# Patient Record
Sex: Male | Born: 1955 | Race: Black or African American | Hispanic: No | Smoking: Current every day smoker
Health system: Southern US, Community
[De-identification: ages and names within clinical notes are randomized; demographics above are authoritative.]

## PROBLEM LIST (undated history)

## (undated) DIAGNOSIS — E785 Hyperlipidemia, unspecified: Secondary | ICD-10-CM

## (undated) DIAGNOSIS — R7611 Nonspecific reaction to tuberculin skin test without active tuberculosis: Secondary | ICD-10-CM

## (undated) DIAGNOSIS — K7460 Unspecified cirrhosis of liver: Secondary | ICD-10-CM

## (undated) DIAGNOSIS — F1910 Other psychoactive substance abuse, uncomplicated: Secondary | ICD-10-CM

## (undated) DIAGNOSIS — I10 Essential (primary) hypertension: Secondary | ICD-10-CM

## (undated) DIAGNOSIS — K219 Gastro-esophageal reflux disease without esophagitis: Secondary | ICD-10-CM

## (undated) DIAGNOSIS — H269 Unspecified cataract: Secondary | ICD-10-CM

## (undated) DIAGNOSIS — N200 Calculus of kidney: Secondary | ICD-10-CM

## (undated) DIAGNOSIS — K861 Other chronic pancreatitis: Secondary | ICD-10-CM

## (undated) DIAGNOSIS — I8500 Esophageal varices without bleeding: Secondary | ICD-10-CM

## (undated) DIAGNOSIS — Z720 Tobacco use: Secondary | ICD-10-CM

## (undated) DIAGNOSIS — K859 Acute pancreatitis without necrosis or infection, unspecified: Secondary | ICD-10-CM

## (undated) DIAGNOSIS — E663 Overweight: Secondary | ICD-10-CM

## (undated) DIAGNOSIS — B182 Chronic viral hepatitis C: Secondary | ICD-10-CM

## (undated) HISTORY — DX: Calculus of kidney: N20.0

## (undated) HISTORY — DX: Essential (primary) hypertension: I10

## (undated) HISTORY — DX: Other chronic pancreatitis: K86.1

## (undated) HISTORY — DX: Hyperlipidemia, unspecified: E78.5

## (undated) HISTORY — DX: Overweight: E66.3

## (undated) HISTORY — DX: Unspecified cirrhosis of liver: K74.60

## (undated) HISTORY — DX: Chronic viral hepatitis C: B18.2

## (undated) HISTORY — DX: Tobacco use: Z72.0

## (undated) HISTORY — DX: Unspecified cataract: H26.9

## (undated) HISTORY — DX: Other psychoactive substance abuse, uncomplicated: F19.10

## (undated) HISTORY — DX: Nonspecific reaction to tuberculin skin test without active tuberculosis: R76.11

## (undated) HISTORY — DX: Acute pancreatitis without necrosis or infection, unspecified: K85.90

---

## 2004-04-26 DIAGNOSIS — B182 Chronic viral hepatitis C: Secondary | ICD-10-CM

## 2004-04-26 HISTORY — PX: PERCUTANEOUS LIVER BIOPSY: SUR136

## 2004-04-26 HISTORY — DX: Chronic viral hepatitis C: B18.2

## 2004-09-03 ENCOUNTER — Ambulatory Visit: Payer: Self-pay | Admitting: Internal Medicine

## 2004-09-10 ENCOUNTER — Ambulatory Visit (HOSPITAL_COMMUNITY): Admission: RE | Admit: 2004-09-10 | Discharge: 2004-09-10 | Payer: Self-pay | Admitting: Internal Medicine

## 2004-10-21 ENCOUNTER — Ambulatory Visit: Payer: Self-pay | Admitting: Internal Medicine

## 2004-10-21 ENCOUNTER — Ambulatory Visit (HOSPITAL_COMMUNITY): Admission: RE | Admit: 2004-10-21 | Discharge: 2004-10-21 | Payer: Self-pay | Admitting: Internal Medicine

## 2004-10-22 ENCOUNTER — Ambulatory Visit (HOSPITAL_COMMUNITY): Admission: RE | Admit: 2004-10-22 | Discharge: 2004-10-22 | Payer: Self-pay | Admitting: Internal Medicine

## 2004-11-17 ENCOUNTER — Ambulatory Visit: Payer: Self-pay | Admitting: Internal Medicine

## 2005-01-21 ENCOUNTER — Ambulatory Visit: Payer: Self-pay | Admitting: Internal Medicine

## 2005-02-10 ENCOUNTER — Ambulatory Visit: Payer: Self-pay | Admitting: Internal Medicine

## 2005-02-10 ENCOUNTER — Encounter (INDEPENDENT_AMBULATORY_CARE_PROVIDER_SITE_OTHER): Payer: Self-pay | Admitting: Internal Medicine

## 2005-02-10 ENCOUNTER — Ambulatory Visit (HOSPITAL_COMMUNITY): Admission: RE | Admit: 2005-02-10 | Discharge: 2005-02-10 | Payer: Self-pay | Admitting: Internal Medicine

## 2005-03-01 ENCOUNTER — Ambulatory Visit: Payer: Self-pay | Admitting: Internal Medicine

## 2005-03-12 ENCOUNTER — Ambulatory Visit: Payer: Self-pay | Admitting: Internal Medicine

## 2005-03-31 ENCOUNTER — Ambulatory Visit: Payer: Self-pay | Admitting: Internal Medicine

## 2005-06-01 ENCOUNTER — Ambulatory Visit: Payer: Self-pay | Admitting: Internal Medicine

## 2005-08-23 ENCOUNTER — Ambulatory Visit: Payer: Self-pay | Admitting: Internal Medicine

## 2005-11-05 ENCOUNTER — Ambulatory Visit: Payer: Self-pay | Admitting: Gastroenterology

## 2006-02-08 ENCOUNTER — Ambulatory Visit: Payer: Self-pay | Admitting: Gastroenterology

## 2006-04-13 ENCOUNTER — Ambulatory Visit: Payer: Self-pay | Admitting: Internal Medicine

## 2006-04-26 DIAGNOSIS — R7611 Nonspecific reaction to tuberculin skin test without active tuberculosis: Secondary | ICD-10-CM

## 2006-04-26 HISTORY — DX: Nonspecific reaction to tuberculin skin test without active tuberculosis: R76.11

## 2008-01-09 ENCOUNTER — Ambulatory Visit (HOSPITAL_COMMUNITY): Admission: RE | Admit: 2008-01-09 | Discharge: 2008-01-09 | Payer: Self-pay | Admitting: Internal Medicine

## 2008-02-17 ENCOUNTER — Emergency Department (HOSPITAL_COMMUNITY): Admission: EM | Admit: 2008-02-17 | Discharge: 2008-02-17 | Payer: Self-pay | Admitting: Emergency Medicine

## 2008-03-06 ENCOUNTER — Ambulatory Visit (HOSPITAL_COMMUNITY): Admission: RE | Admit: 2008-03-06 | Discharge: 2008-03-06 | Payer: Self-pay | Admitting: Family Medicine

## 2008-04-03 ENCOUNTER — Ambulatory Visit (HOSPITAL_COMMUNITY): Admission: RE | Admit: 2008-04-03 | Discharge: 2008-04-03 | Payer: Self-pay | Admitting: Pulmonary Disease

## 2010-06-19 ENCOUNTER — Emergency Department (HOSPITAL_COMMUNITY)
Admission: EM | Admit: 2010-06-19 | Discharge: 2010-06-19 | Disposition: A | Discharge status: Home / Self Care | Payer: Medicare Other | Attending: Emergency Medicine | Admitting: Emergency Medicine

## 2010-06-19 ENCOUNTER — Emergency Department (HOSPITAL_COMMUNITY): Payer: Medicare Other

## 2010-06-19 DIAGNOSIS — I10 Essential (primary) hypertension: Secondary | ICD-10-CM | POA: Insufficient documentation

## 2010-06-19 DIAGNOSIS — R109 Unspecified abdominal pain: Secondary | ICD-10-CM | POA: Insufficient documentation

## 2010-06-19 DIAGNOSIS — R197 Diarrhea, unspecified: Secondary | ICD-10-CM | POA: Insufficient documentation

## 2010-06-19 DIAGNOSIS — R112 Nausea with vomiting, unspecified: Secondary | ICD-10-CM | POA: Insufficient documentation

## 2010-06-19 DIAGNOSIS — K5289 Other specified noninfective gastroenteritis and colitis: Secondary | ICD-10-CM | POA: Insufficient documentation

## 2010-06-19 DIAGNOSIS — E119 Type 2 diabetes mellitus without complications: Secondary | ICD-10-CM | POA: Insufficient documentation

## 2010-06-19 LAB — DIFFERENTIAL
Basophils Absolute: 0 10*3/uL (ref 0.0–0.1)
Basophils Relative: 0 % (ref 0–1)
Eosinophils Absolute: 0 10*3/uL (ref 0.0–0.7)
Eosinophils Relative: 0 % (ref 0–5)
Lymphocytes Relative: 12 % (ref 12–46)
Lymphs Abs: 1.4 10*3/uL (ref 0.7–4.0)
Monocytes Absolute: 0.6 10*3/uL (ref 0.1–1.0)
Monocytes Relative: 6 % (ref 3–12)
Neutro Abs: 8.8 10*3/uL — ABNORMAL HIGH (ref 1.7–7.7)
Neutrophils Relative %: 81 % — ABNORMAL HIGH (ref 43–77)

## 2010-06-19 LAB — BASIC METABOLIC PANEL
BUN: 18 mg/dL (ref 6–23)
CO2: 23 mEq/L (ref 19–32)
Calcium: 9.6 mg/dL (ref 8.4–10.5)
Chloride: 97 mEq/L (ref 96–112)
Creatinine, Ser: 1.26 mg/dL (ref 0.4–1.5)
GFR calc Af Amer: >60 mL/min (ref >60)
GFR calc non Af Amer: >60 mL/min (ref >60)
Glucose, Bld: 238 mg/dL — ABNORMAL HIGH (ref 70–99)
Potassium: 4.6 mEq/L (ref 3.5–5.1)
Sodium: 134 mEq/L — ABNORMAL LOW (ref 135–145)

## 2010-06-19 LAB — CBC
HCT: 48.4 % (ref 39.0–52.0)
Hemoglobin: 17.4 g/dL — ABNORMAL HIGH (ref 13.0–17.0)
MCH: 31.8 pg (ref 26.0–34.0)
MCHC: 36 g/dL (ref 30.0–36.0)
MCV: 88.5 fL (ref 78.0–100.0)
Platelets: 153 10*3/uL (ref 150–400)
RBC: 5.47 MIL/uL (ref 4.22–5.81)
RDW: 12.8 % (ref 11.5–15.5)
WBC: 10.9 10*3/uL — ABNORMAL HIGH (ref 4.0–10.5)

## 2010-09-04 DIAGNOSIS — E785 Hyperlipidemia, unspecified: Secondary | ICD-10-CM

## 2010-09-04 DIAGNOSIS — Z720 Tobacco use: Secondary | ICD-10-CM

## 2010-09-04 DIAGNOSIS — K859 Acute pancreatitis without necrosis or infection, unspecified: Secondary | ICD-10-CM

## 2010-09-04 DIAGNOSIS — E119 Type 2 diabetes mellitus without complications: Secondary | ICD-10-CM | POA: Insufficient documentation

## 2010-09-04 DIAGNOSIS — I10 Essential (primary) hypertension: Secondary | ICD-10-CM

## 2010-09-07 ENCOUNTER — Encounter: Payer: Self-pay | Admitting: Cardiology

## 2010-09-07 ENCOUNTER — Ambulatory Visit (INDEPENDENT_AMBULATORY_CARE_PROVIDER_SITE_OTHER): Payer: Medicare Other | Admitting: Cardiology

## 2010-09-07 DIAGNOSIS — I2510 Atherosclerotic heart disease of native coronary artery without angina pectoris: Secondary | ICD-10-CM

## 2010-09-07 DIAGNOSIS — K8020 Calculus of gallbladder without cholecystitis without obstruction: Secondary | ICD-10-CM | POA: Insufficient documentation

## 2010-09-07 DIAGNOSIS — K7460 Unspecified cirrhosis of liver: Secondary | ICD-10-CM

## 2010-09-07 DIAGNOSIS — B182 Chronic viral hepatitis C: Secondary | ICD-10-CM

## 2010-09-07 DIAGNOSIS — K861 Other chronic pancreatitis: Secondary | ICD-10-CM

## 2010-09-07 DIAGNOSIS — H269 Unspecified cataract: Secondary | ICD-10-CM

## 2010-09-07 DIAGNOSIS — Z720 Tobacco use: Secondary | ICD-10-CM | POA: Insufficient documentation

## 2010-09-07 DIAGNOSIS — J479 Bronchiectasis, uncomplicated: Secondary | ICD-10-CM

## 2010-09-07 DIAGNOSIS — E785 Hyperlipidemia, unspecified: Secondary | ICD-10-CM

## 2010-09-07 DIAGNOSIS — E663 Overweight: Secondary | ICD-10-CM | POA: Insufficient documentation

## 2010-09-07 DIAGNOSIS — I10 Essential (primary) hypertension: Secondary | ICD-10-CM

## 2010-09-07 DIAGNOSIS — F17200 Nicotine dependence, unspecified, uncomplicated: Secondary | ICD-10-CM

## 2010-09-07 NOTE — Patient Instructions (Addendum)
Your physician recommends that you schedule a follow-up appointment in:3 months Your physician has requested that you have an echocardiogram. Echocardiography is a painless test that uses sound waves to create images of your heart. It provides your doctor with information about the size and shape of your heart and how well your heart's chambers and valves are working. This procedure takes approximately one hour. There are no restrictions for this procedure.  Your physician has requested that you regularly monitor and record your blood pressure readings at home. Please use the same machine at the same time of day to check your readings and record them to bring to your follow-up visit. Keep bp records and bring to office visit in 3 months  Your physician has recommended you make the following change in your medication: stop imdur

## 2010-09-07 NOTE — Progress Notes (Signed)
HPI:  Nicholas Becker is seen in the office at the kind request of Dr. Forest Gleason for evaluation of possible ischemic heart disease and management of medical therapy.  This nice gentleman was admitted to [facility] approximately 10 years ago for uncertain indications.  He was told at that time that he had heart problems, but is unaware of the exact nature of these.  Since then, he has had no apparent cardiac issues except for management of hypertension; however, he has been maintained on medications appropriate for ischemic heart disease most notably aspirin and nitrates.  He denies dyspnea, orthopnea, PND or chest discomfort.  Lifestyle is sedentary, but he has no symptoms when walking or climbing a flight of stairs.    Current Outpatient Prescriptions on File Prior to Visit  Medication Sig Dispense Refill  . aspirin 81 MG tablet Take 81 mg by mouth daily.        . citalopram (CELEXA) 40 MG tablet Take 40 mg by mouth daily.        Marland Kitchen glucagon (GLUCAGON EMERGENCY) 1 MG injection Inject 1 mg into the vein once as needed.        . insulin regular (NOVOLIN R) 100 UNIT/ML injection Inject 100 Units into the skin 2 (two) times daily before a meal. 85 units am 40 units pm      . lisinopril-hydrochlorothiazide (PRINZIDE,ZESTORETIC) 20-12.5 MG per tablet Take 1 tablet by mouth daily. Take 2 tabs daily      . metFORMIN (GLUCOPHAGE) 1000 MG tablet Take 1,000 mg by mouth 2 (two) times daily with a meal.        . nadolol (CORGARD) 80 MG tablet Take 80 mg by mouth daily.        . simvastatin (ZOCOR) 10 MG tablet Take 10 mg by mouth at bedtime.        . traZODone (DESYREL) 150 MG tablet Take 150 mg by mouth at bedtime.        Marland Kitchen DISCONTD: isosorbide mononitrate (IMDUR) 30 MG 24 hr tablet Take 30 mg by mouth 2 (two) times daily.          Not on File    Past Medical History  Diagnosis Date  . Diabetes mellitus   . Hypertension   . Hyperlipemia   . Chronic pancreatitis   . Tobacco abuse     1/2 pack per  day  . Chronic hepatitis C with cirrhosis     Normal LFTs; biopsy in 2006; esophageal varices  . Cholelithiasis   . Bronchiectasis 2009    Left lower lobe  . Cataracts, bilateral   . Overweight      History reviewed. No pertinent past surgical history.   History reviewed. No pertinent family history.   History   Social History  . Marital Status: Divorced    Spouse Name: N/A    Number of Children: N/A  . Years of Education: N/A   Occupational History  . Not on file.   Social History Main Topics  . Smoking status: Current Everyday Smoker  . Smokeless tobacco: Never Used  . Alcohol Use: No  . Drug Use: No  . Sexually Active: Not on file   Other Topics Concern  . Not on file   Social History Narrative  . No narrative on file     ROS:      All other systems reviewed and are negative.  PHYSICAL EXAM: BP 137/83  Pulse 64  Ht 5\' 5"  (1.651 m)  Wt 170 lb (77.111 kg)  BMI 28.29 kg/m2  SpO2 97%  General-Well-developed; no acute distress Body Habitus-proportionate weight and height HEENT-[place]/AT; PERRL; EOM intact; conjunctiva and lids nl Neck-No JVD; no carotid bruits Endocrine-No thyromegaly Lungs-Clear lung fields; resonant percussion; normal I-to-E ratio Cardiovascular- normal PMI; split S1 and normal S2; minimal systolic ejection murmur Abdomen-BS normal; soft and non-tender without masses or organomegaly Musculoskeletal-No deformities, cyanosis or clubbing Neurologic-Nl cranial nerves; symmetric strength and tone Skin- Warm, no significant lesions Extremities-Nl distal pulses; no edema  EKG:   Sinus bradycardia; probable prior septal infarction; minimal nonspecific ST segment abnormalities; no previous tracing for comparison.  ASSESSMENT AND PLAN:

## 2010-09-08 NOTE — Assessment & Plan Note (Addendum)
Blood pressure control appears good with current therapy, which will be continued.  Nicholas Becker history of cardiac disease is quite vague.  We have requested records from [facility].  Long-acting nitrates are likely providing little, if any, benefit and can be discontinued.  Is no further information is forthcoming from [facility], I would not pursue any additional diagnostic testing.

## 2010-09-08 NOTE — Assessment & Plan Note (Addendum)
Lipid profile results are not available to me.  Patient likely requires treatment with a statin simply based upon the presence of diabetes.  Since efficacy is increased with higher doses, but cost and risk are only minimally elevated, dose escalation to 20 or 40 mg per day would be appropriate, especially if LDL remains elevated with current therapy.

## 2010-09-08 NOTE — Assessment & Plan Note (Addendum)
Patient advised to discontinue use of tobacco products, but does not appear inclined to do so at present.  I will plan to see this nice gentleman again in 3 months to review his progress and records.  If he is doing well, and no significant cardiac disease has been documented, he will probably not require additional cardiology followup.

## 2010-09-11 NOTE — Op Note (Signed)
NAME:  Nicholas Becker, Nicholas Becker             ACCOUNT NO.:  [id number]   MEDICAL RECORD NO.:  [id number]          PATIENT TYPE:  AMB   LOCATION:  DAY                           FACILITY:  [facility]   PHYSICIAN:  Lionel December, M.D.    DATE OF BIRTH:  Oct 31, 1955   DATE OF PROCEDURE:  10/21/2004  DATE OF DISCHARGE:                                 OPERATIVE REPORT   PROCEDURE:  Esophagogastroduodenoscopy.   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 He was recently  seen in the office for GI follow-up. Although he denied drinking alcohol for  year according to caretaker, he has still been drinking intermittently. His  INR is 1.1. LFTs revealed normal bilirubin and alkaline phosphatase.  His  AST is 40, ALT 31, albumin was 3.3. He is undergoing EGD to find if he has  large varices in which case these will be banded.  We also check his HCV RNA  which is positive at 7.14 log IU per mL.   PREMEDICATION:  Cetacaine spray pharyngeal topical anesthesia, Demerol 50  milligrams IV, Versed 4 milligrams IV in divided dose.   FINDINGS:  Procedure performed in endoscopy suite. The patient's vital signs  and O2 sat were monitored during procedure and remained stable. The patient  was placed left lateral position. Olympus videoscope was passed via  oropharynx without any difficulty into esophagus.   Esophagus. Mucosa of the esophagus normal. There were three columns of  esophageal varices which were quite small.  One was at most grade 2. These  involved distal 7 to 8 cm. GE junction was at 38 and hiatus was at 41 cm.  There was also scarring at 2 o'clock about 4 to 5 cm proximal to GE junction  site of previous banding.   Stomach. It was empty and distended very well with insufflation. Folds  proximal stomach were normal. Examination mucosa revealed focal  edema,  erythema in prepyloric area, but no erosions or ulcers were noted.  Angularis, fundus and cardia examined by retroflexing scope and were normal.  No fundal varices were identified.   Duodenum.  Bulbar mucosa was normal. Scope was passed into second part of  the duodenum where mucosa and folds were normal. Endoscope was withdrawn. No  therapeutic intervention was rendered.   The patient tolerated the procedure well.   FINAL DIAGNOSIS:  Three columns of grade 1 to 2 esophageal varices, only one  column was grade 2.   Small sliding hiatal hernia.  Prepyloric mucosal edema, which was focal but  no evidence of fundal varices or peptic ulcer disease.   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 If he can abstain  from drinking alcohol, he would be a candidate for antiviral therapy.   RECOMMENDATIONS:  He will continue entire reflux measures and Nexium as  before. The patient strongly advised that he must quit drinking alcohol,  before antiviral therapy for hepatitis C will be considered.   We will bring  him for OV in three months and determine whether or not we  should proceed with antiviral therapy.       NR/MEDQ  D:  10/21/2004  T:  10/21/2004  Job:  [id number]   cc:   __________ __________

## 2010-09-11 NOTE — H&P (Signed)
NAME:  Nicholas Becker, Nicholas Becker             ACCOUNT NO.:  [id number]   MEDICAL RECORD NO.:  [id number]          PATIENT TYPE:  AMB   LOCATION:                                FACILITY:  [facility]   PHYSICIAN:  Lionel December, M.D.    DATE OF BIRTH:  [birth year]   DATE OF ADMISSION:  DATE OF DISCHARGE:  LH                                HISTORY & PHYSICAL   PRESENTING COMPLAINT:  Follow of chronic hepatitis C.   HISTORY OF PRESENT ILLNESS:  Nicholas Becker is a 55 year old African-American male  with history of chronic hepatitis C who has also been found to have  cirrhosis because he presented with esophageal variceal bleed five years ago  and had banding done via [facility]. He has never had liver biopsy. He was  recently seen by [place]. He had esophagogastroduodenoscopy on October 21, 2004. He  had a small sliding hiatal hernia and grade 1 to 2 three columns of  esophageal varices. He had ultrasound on Sep 10, 2004 which revealed  cholelithiasis and multiple pancreatic calcifications, but the liver  appeared to be normal. His alpha fetoprotein was elevated at 22.6. He  therefore underwent MRI of the liver. This study confirmed cholelithiasis,  but his liver size and texture appeared to be normal, and there was no  evidence of splenomegaly or ascites. The patient has had problems with  ethanol abuse, but he has not had any alcohol since April of this year. He  is staying at rest home. He feels well. He is very much interested in  undergoing antiviral therapy. He has a good appetite. He denies weakness,  fatigue, abdominal pain, melena, rectal bleeding, nausea or vomiting. 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 He  feels his depression is well controlled.   MEDICATIONS:  1.  He is on MVI daily.  2.  B complex daily.  3.  Citalopram 40 mg daily.  4.  Prilosec 20 mg q.a.m.  5.  Folic acid 1 mg daily.  6.  Novolin 70/30 70 units in the a.m. and 22 units in p.m.  7.  Imdur 30 mg  b.i.d.  8.  Trazodone 150 mg nightly.  9.  Novolin R via sliding scale.  10. Lactulose 30 mL p.o. b.i.d.  11. Nadolol 80 mg daily.   PAST MEDICAL HISTORY:  1.  Hypertension.  2.  Diabetes mellitus.  3.  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  4.  Chronic hepatitis B genotype 1-B.  5.  History of variceal bleed, and he was therefore presumed to have      cirrhosis, but he has never had tissue diagnosis, and a recent      ultrasound and MRI were normal.  6.  History of drug abuse in the past.  7.  He is status post laparotomy in the 1980s, possibly from bleeding, but      details are not available.   ALLERGIES:  NK.   FAMILY HISTORY:  Noncontributory. He has seven siblings.  SOCIAL HISTORY:  He is divorced. He has two children. He is presently  unemployed. He is smoking less than eight cigarettes per day. He has not had  any alcohol in about six months.   OBJECTIVE:  VITAL SIGNS:  Weight 163 pounds. He is 5 feet 5 inches tall.  Pulse 60 per minute, blood pressure 138/80, temperature 90.0.  GENERAL:  He is alert and does not have asterixis.  HEENT:  Conjunctivae is pink. Sclerae is nonicteric. Oropharyngeal mucosa is  normal. No neck masses are noted. No spider angiomata or gynecomastia are  noted.  CARDIAC:  Regular rhythm. Normal S1 and S2. No murmur or gallop are noted.  LUNGS:  Clear to auscultation.  ABDOMEN:  Full but soft and nontender without hepatosplenomegaly, and he  does not have peripheral edema.   LABORATORY DATA:  From December 08, 2004, bilirubin 1, AP 140, AST 35, ALT 28,  total protein 7.4 with albumin of 4.1. His AST was 13.2. Please note was  22.6 in May of this year.   ASSESSMENT:  Chronic hepatitis C with preserved hepatitic function. His  alpha fetoprotein is mildly elevated with a downward trend, but MRI and  ultrasound have been negative for any suspicious areas. In fact, both of  these studies  are normal and do not show any changes of cirrhosis. However,  this would not completely rule out cirrhosis. 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  He is doing so well. Given his age, I think it will be reasonable to  consider antiviral therapy. He is on citalopram and trazodone presumably for  depression. This appears to be well controlled.   PLAN:  Will consider percutaneous liver biopsy if PT, PTT and platelet count  are within desirable range.   He will have CBC, PT, PTT, bleeding time, and if decision is made to proceed  with biopsy, he will also be typed and crossed for 2 units. He will have  hepatic ultrasonography to mark site for liver biopsy the same day. I have  reviewed the procedure risks with the patient. He is agreeable, and we will  discuss these again prior to the procedure.      Lionel December, M.D.  Electronically Signed     NR/MEDQ  D:  01/21/2005  T:  01/21/2005  Job:  [id number]   cc:   [facility]  Fax: [phone]

## 2010-09-11 NOTE — H&P (Signed)
NAME:  BODE, PIEPER             ACCOUNT NO.:  [id number]   MEDICAL RECORD NO.:  [id number]          PATIENT TYPE:  AMB   LOCATION:  DAY                           FACILITY:  [facility]   PHYSICIAN:  Kassie Mends, M.D.      DATE OF BIRTH:  [birth year]   DATE OF ADMISSION:  DATE OF DISCHARGE:  LH                                HISTORY & PHYSICAL   SUBJECTIVE:  Mr. Cooler is a 55 year old male with hepatitis C cirrhosis.  He is currently undergoing treatment with hepatitis C and with [place] and  Pegasys since December 2006.  His viral load is undetectable.  He also had a  positive PPD and has been started on INH and vitamin B6.  He is seen in  follow-up today.  His last CBC was in July 2007 which showed a hemoglobin of  10.6 and platelets of 110,000. His absolute neutrophil count was greater  than 1000.  He has no particular questions, concerns, or complaints today.  He denies fatigue, depression, black stool, blood in his stool, or  hematemesis.   OBJECTIVE:  VITAL SIGNS: Weight 159 pounds, unchanged from his last visit in  April 2007.  Height 5 feet 5 inches, temperature 91, blood pressure 122/60,  pulse 64. GENERAL:  He is no apparent distress, alert and oriented times  four.  LUNGS:  Clear to auscultation bilaterally.  CARDIOVASCULAR:  Regular  rhythm.  No murmur.  Normal S1 and S2.  ABDOMEN:  Bowel sounds present,  soft, nontender, and nondistended.  No rebound or guarding.   ASSESSMENT:  Mr. Lynam is a 55 year old male who has chronic hepatitis C  genotype 1.  He has normal liver enzymes and a fairly stable hemoglobin and  platelet count.   PLAN:  Repeat CBC in two weeks per Dr. Karilyn Cota.  We will give him a referral  to [facility] so that he will not have to travel to [place] to  have his blood drawn.  He will have a return patient visit with Tana Coast, PA, for Dr. Karilyn Cota in three months.      Kassie Mends, M.D.  Electronically Signed     SM/MEDQ  D:   11/05/2005  T:  11/05/2005  Job:  [id number]   cc:   Lionel December, M.D.  [id number]  [place]  [place] [address]

## 2010-09-11 NOTE — Op Note (Signed)
NAME:  Nicholas Becker, Nicholas Becker             ACCOUNT NO.:  [id number]   MEDICAL RECORD NO.:  [id number]          PATIENT TYPE:  AMB   LOCATION:  DAY                           FACILITY:  [facility]   PHYSICIAN:  Lionel December, M.D.    DATE OF BIRTH:  Feb 07, 1956   DATE OF PROCEDURE:  02/10/2005  DATE OF DISCHARGE:                                 OPERATIVE REPORT   PROCEDURE:  Percutaneous liver biopsy.   INDICATIONS:  Mavric is a 55 year old African-American male with history of  cirrhosis felt to be secondary to prior ethanol use and hepatitis C. Few  years ago, he underwent variceal banding at [facility]. It appears his hepatic  function has improved since he quit drinking alcohol. Recent EGD showed only  small esophageal varices. He is undergoing liver biopsy for staging prior to  considering antiviral therapy. Procedure risks were reviewed with the  patient, and informed consent was obtained. His platelet count is 137. His  PT/PTT are normal and bleeding time is also normal.   FINDINGS:  Procedure performed in endoscopy suite. The patient was placed in  supine position. Ultrasound already had been performed this morning and site  was marked. Skin was prepped in the usual fashion with Betadine solution and  isolated using sterile sheet. One percent Xylocaine was injected in the skin  and subcutaneous tissue using 25-gauge needle. Using the same needle, deeper  injection was made into liver capsule. Small skin deep incision was made to  facilitate entry of liver biopsy needle. Liver sounding was done with trocar  of 22-gauge spinal needle. Liver biopsy was performed with the patient  holding his breath at the end expiration. Two passes were made. Adequate  liver tissue was obtained. Puncture site was covered with Band-Aid, and the  patient was asked to lie on his right side. The patient tolerated the  procedure well.   PLAN:  He will be given Demerol 50 mg IM. He will have vital signs per  protocol. H&H  will be repeated in three hours. Will resume feedings in one  hour.      Lionel December, M.D.  Electronically Signed     NR/MEDQ  D:  02/10/2005  T:  02/10/2005  Job:  [id number]   cc:   Jorene Guest, M.D.  [facility]

## 2010-09-16 ENCOUNTER — Other Ambulatory Visit (HOSPITAL_COMMUNITY): Payer: Medicare Other

## 2010-09-23 ENCOUNTER — Ambulatory Visit (HOSPITAL_COMMUNITY): Payer: Medicare Other

## 2010-09-29 ENCOUNTER — Ambulatory Visit (HOSPITAL_COMMUNITY)
Admission: RE | Admit: 2010-09-29 | Discharge: 2010-09-29 | Disposition: A | Discharge status: Home / Self Care | Payer: Medicare Other | Source: Ambulatory Visit | Attending: Family Medicine | Admitting: Family Medicine

## 2010-09-29 DIAGNOSIS — I2510 Atherosclerotic heart disease of native coronary artery without angina pectoris: Secondary | ICD-10-CM | POA: Insufficient documentation

## 2010-09-29 DIAGNOSIS — I10 Essential (primary) hypertension: Secondary | ICD-10-CM | POA: Insufficient documentation

## 2010-09-29 DIAGNOSIS — E119 Type 2 diabetes mellitus without complications: Secondary | ICD-10-CM | POA: Insufficient documentation

## 2010-12-05 ENCOUNTER — Other Ambulatory Visit: Payer: Self-pay | Admitting: *Deleted

## 2010-12-05 DIAGNOSIS — I2510 Atherosclerotic heart disease of native coronary artery without angina pectoris: Secondary | ICD-10-CM

## 2010-12-08 ENCOUNTER — Ambulatory Visit (INDEPENDENT_AMBULATORY_CARE_PROVIDER_SITE_OTHER): Payer: Medicare Other | Admitting: Cardiology

## 2010-12-08 ENCOUNTER — Encounter: Payer: Self-pay | Admitting: Cardiology

## 2010-12-08 DIAGNOSIS — R7611 Nonspecific reaction to tuberculin skin test without active tuberculosis: Secondary | ICD-10-CM | POA: Insufficient documentation

## 2010-12-08 DIAGNOSIS — I10 Essential (primary) hypertension: Secondary | ICD-10-CM

## 2010-12-08 DIAGNOSIS — J479 Bronchiectasis, uncomplicated: Secondary | ICD-10-CM | POA: Insufficient documentation

## 2010-12-08 DIAGNOSIS — B182 Chronic viral hepatitis C: Secondary | ICD-10-CM | POA: Insufficient documentation

## 2010-12-08 DIAGNOSIS — K7460 Unspecified cirrhosis of liver: Secondary | ICD-10-CM | POA: Insufficient documentation

## 2010-12-08 DIAGNOSIS — E785 Hyperlipidemia, unspecified: Secondary | ICD-10-CM

## 2010-12-08 DIAGNOSIS — K861 Other chronic pancreatitis: Secondary | ICD-10-CM | POA: Insufficient documentation

## 2010-12-08 DIAGNOSIS — Z720 Tobacco use: Secondary | ICD-10-CM

## 2010-12-08 NOTE — Progress Notes (Signed)
HPI : Mr. Sulkowski returns to the office as scheduled and continues to do extremely well from a medical standpoint.  He denies any GI or cardiac symptoms and has not required urgent medical care.  Blood pressure control has been excellent according to a chart that he has maintained.  Diabetic control has reportedly also been good.  He does some walking and other exercise without dyspnea or chest discomfort.  An echocardiogram performed 2 months ago showed modest LVH with normal LV systolic function and no significant valvular abnormalities.  Current Outpatient Prescriptions on File Prior to Visit  Medication Sig Dispense Refill  . aspirin 81 MG tablet Take 81 mg by mouth daily.        . citalopram (CELEXA) 40 MG tablet Take 40 mg by mouth daily.        Marland Kitchen glucagon (GLUCAGON EMERGENCY) 1 MG injection Inject 1 mg into the vein once as needed.        . insulin regular (HUMULIN R,NOVOLIN R) 100 UNIT/ML injection Inject into the skin. Use on sliding scale       . lisinopril-hydrochlorothiazide (PRINZIDE,ZESTORETIC) 20-12.5 MG per tablet Take 2 tablets by mouth daily. Take 2 tabs daily      . metFORMIN (GLUCOPHAGE) 1000 MG tablet Take 1,000 mg by mouth 2 (two) times daily with a meal.        . nadolol (CORGARD) 80 MG tablet Take 80 mg by mouth daily.        Marland Kitchen omeprazole (PRILOSEC) 20 MG capsule Take 20 mg by mouth daily.        . simvastatin (ZOCOR) 10 MG tablet Take 10 mg by mouth at bedtime.        . traZODone (DESYREL) 150 MG tablet Take 75 mg by mouth at bedtime.          No Known Allergies    Past medical history, social history, and family history reviewed and updated.  ROS: See history of present illness.  PHYSICAL EXAM: There were no vitals taken for this visit.  General-Well developed; no acute distress Body habitus-proportionate weight and height Neck-No JVD; no carotid bruits Lungs-clear lung fields; resonant to percussion Cardiovascular-normal PMI; normal S1 and S2; modest systolic  murmur Abdomen-normal bowel sounds; soft and non-tender without masses or organomegaly Musculoskeletal-No deformities, no cyanosis or clubbing Neurologic-Normal cranial nerves; symmetric strength and tone Skin-Warm, no significant lesions Extremities-distal pulses intact; no edema   ASSESSMENT AND PLAN:

## 2010-12-08 NOTE — Assessment & Plan Note (Addendum)
No recent lipid measurements are available to me, but control is reportedly good according to the patient's PCP.  Since he has been committed to pharmacologic therapy, I would increase the dose of simvastatin to 20 or 40 mg per day for additional lipid lowering without much increase in the risk of adverse effects.

## 2010-12-08 NOTE — Patient Instructions (Signed)
**Note De-identified Nicholas Becker Obfuscation** Your physician recommends that you continue on your current medications as directed. Please refer to the Current Medication list given to you today.  Your physician recommends that you schedule a follow-up appointment in: as needed  

## 2010-12-08 NOTE — Assessment & Plan Note (Addendum)
Importance of discontinuing tobacco use once again discussed, and once again, patient is not inclined to attempt to quit.  Medical records from [facility] were not successfully retrieved.  I will obtain them and review.  Unless there is evidence for more important cardiac disease, I will leave management of hyperlipidemia and hypertension in Ms. West's capable hands and be available to see this gentleman as needed.

## 2010-12-08 NOTE — Assessment & Plan Note (Addendum)
A chart with multiple blood pressures (more than 50 measurements) is returned.  All values are excellent.  Current regime for treatment of hypertension is appropriate and will be continued.

## 2010-12-31 ENCOUNTER — Encounter: Payer: Self-pay | Admitting: Cardiology

## 2010-12-31 DIAGNOSIS — N200 Calculus of kidney: Secondary | ICD-10-CM

## 2010-12-31 DIAGNOSIS — F1910 Other psychoactive substance abuse, uncomplicated: Secondary | ICD-10-CM | POA: Insufficient documentation

## 2010-12-31 HISTORY — DX: Calculus of kidney: N20.0

## 2011-01-22 ENCOUNTER — Encounter (INDEPENDENT_AMBULATORY_CARE_PROVIDER_SITE_OTHER): Payer: Self-pay | Admitting: *Deleted

## 2011-01-25 LAB — DIFFERENTIAL
Basophils Absolute: 0
Basophils Relative: 1
Eosinophils Absolute: 0.1
Eosinophils Relative: 2
Lymphocytes Relative: 33
Lymphs Abs: 2.6
Monocytes Absolute: 0.7
Monocytes Relative: 9
Neutro Abs: 4.5
Neutrophils Relative %: 56

## 2011-01-25 LAB — CBC
HCT: 40.2
Hemoglobin: 13.7
MCHC: 34
MCV: 94.4
Platelets: 127 — ABNORMAL LOW
RBC: 4.26
RDW: 13.2
WBC: 8.1

## 2011-01-25 LAB — COMPREHENSIVE METABOLIC PANEL
ALT: 23
AST: 29
Albumin: 3.7
Alkaline Phosphatase: 77
BUN: 5 — ABNORMAL LOW
CO2: 26
Calcium: 8.9
Chloride: 102
Creatinine, Ser: 0.86
GFR calc Af Amer: >60
GFR calc non Af Amer: >60
Glucose, Bld: 337 — ABNORMAL HIGH
Potassium: 3.5
Sodium: 132 — ABNORMAL LOW
Total Bilirubin: 0.7
Total Protein: 6.3

## 2011-01-25 LAB — APTT: aPTT: 34

## 2011-01-25 LAB — PROTIME-INR
INR: 1.2
Prothrombin Time: 15.1

## 2011-01-29 LAB — CBC
HCT: 45.2 % (ref 39.0–52.0)
MCV: 94 fL (ref 78.0–100.0)
Platelets: 127 10*3/uL — ABNORMAL LOW (ref 150–400)
RDW: 13.4 % (ref 11.5–15.5)

## 2011-03-15 ENCOUNTER — Ambulatory Visit (INDEPENDENT_AMBULATORY_CARE_PROVIDER_SITE_OTHER): Payer: Medicare Other | Admitting: Internal Medicine

## 2011-03-15 ENCOUNTER — Other Ambulatory Visit (INDEPENDENT_AMBULATORY_CARE_PROVIDER_SITE_OTHER): Payer: Self-pay | Admitting: Internal Medicine

## 2011-03-15 ENCOUNTER — Encounter (INDEPENDENT_AMBULATORY_CARE_PROVIDER_SITE_OTHER): Payer: Self-pay | Admitting: Internal Medicine

## 2011-03-15 DIAGNOSIS — B182 Chronic viral hepatitis C: Secondary | ICD-10-CM

## 2011-03-15 DIAGNOSIS — Z0189 Encounter for other specified special examinations: Secondary | ICD-10-CM

## 2011-03-15 DIAGNOSIS — K7460 Unspecified cirrhosis of liver: Secondary | ICD-10-CM

## 2011-03-15 NOTE — Progress Notes (Signed)
Presenting complaint; Elevated alpha-fetoprotein in a patient with history of cirrhosis and successfully treated hepatitis C over 2 years ago. Subjective: Patient is 55 year old African American male who has history of biopsy proven cirrhosis predominantly secondary to chronic hepatitis C who was successfully treated with combination of Pegasys and ribavirin about 2 years ago. He has been getting periodic alpha-fetoprotein by Dr. Casilda Carls and therefore sent over for evaluation. Patient has no complaints. He has good appetite. He denies abdominal pain nausea vomiting melena or rectal bleeding. He denies recent weight change. He also denies fever chills or night sweats. He is doing some exercise and tries to walk some few times a week. Current Medications: Current Outpatient Prescriptions  Medication Sig Dispense Refill  . aspirin 81 MG tablet Take 81 mg by mouth daily.        . citalopram (CELEXA) 40 MG tablet Take 40 mg by mouth daily.        Marland Kitchen glucagon (GLUCAGON EMERGENCY) 1 MG injection Inject 1 mg into the skin once as needed. Patient injects 1/2 mg as needed      . insulin NPH-insulin regular (NOVOLIN 70/30) (70-30) 100 UNIT/ML injection Inject into the skin. 85 units in am and 40 units in pm      . insulin regular (HUMULIN R,NOVOLIN R) 100 UNIT/ML injection Inject into the skin. Use on sliding scale       . lisinopril-hydrochlorothiazide (PRINZIDE,ZESTORETIC) 20-12.5 MG per tablet Take 2 tablets by mouth daily. Take 2 tabs daily      . metFORMIN (GLUCOPHAGE) 1000 MG tablet Take 1,000 mg by mouth 2 (two) times daily with a meal.        . nadolol (CORGARD) 80 MG tablet Take 80 mg by mouth daily.        Marland Kitchen omeprazole (PRILOSEC) 20 MG capsule Take 20 mg by mouth daily.        . simvastatin (ZOCOR) 10 MG tablet Take 10 mg by mouth at bedtime.        . traZODone (DESYREL) 150 MG tablet Take 75 mg by mouth at bedtime.        past medical history History of cirrhosis secondary to hepatitis C and  prior ethanol abuse. He he had esophageal variceal banding at [facility] in October 2002. He had grade 1-2 esophageal varices in June 2006. Liver biopsy in October 2006 revealed chronic active hepatitis with cirrhosis due to hep C. Patient quit drinking alcohol 6 years ago.Marland Kitchen He received 48 weeks of therapy for hepatitis C with SVR. His last screening ultrasound was at [facility] in [place] and these records have been requested. Hypertension. Hyperlipidemia. He had screening colonoscopy less than 2 years ago at [facility] and was unremarkable. Diabetes mellitus. History of alcoholic pancreatitis without clinical  sequelae. Asymptomatic cholelithiasis Laparotomy over 30 years ago for unclear reasons. History of bronchiectasis. History of treated positive PPD.  Objective: BP 102/74  Pulse 70  Temp(Src) 98.1 F (36.7 C) (Oral)  Resp 12  Ht 5\' 5"  (1.651 m)  Wt 164 lb (74.39 kg)  BMI 27.29 kg/m2 Patient is alert and in NAD. Conjunctiva is pink. Sclerae nonicteric.  Oropharyngeal mucosa is normal. No neck masses or thyromegaly noted. Cardiac exam with regular rhythm normal S1 and S2. No murmur or gallop noted. Lungs are clear to auscultation. Abdomen is full but soft and nontender with palpable spleen tip. Liver edge is indistinct liver does not appear to be enlarged. No peripheral edema or clubbing noted. Lab data From 06/18/2010 WBC  8.3 H&H 16.4 and 46.8 and platelet count 165K. Bili oh 0.3 AP 76 AST 24 ALT 12 albumin 4.6 AFP 20.9. Lab data from 12/16/2010. WBC 5.5, H&H 15.1 and 43.3, platelet count 129K. Bili oh 0.3 AP 77, AST 16, ALT 15, albumin 4.5. AFP 28.2  Assessment: Mildly elevated AFP level in a patient with history of cirrhosis and successfully treated chronic hepatitis C. While AFP level is not astronomically high still need to worry about HCC in a patient with history of cirrhosis. Risk of HCC decreases significantly  with eradication of hepatitis C but it  does not go down to 0 in the  setting of cirrhosis. Patient's transaminases are normal and he does not have evidence of hepatic decompensation.  Recommendations He would go to the lab for HCV RNA by PCR to document that he is free of HCV infection. Will schedule him for MRI of liver with contrast. Given that he is diabetic we will repeat his serum creatinine since last one was over 6 weeks ago. Will request records from [facility] office.

## 2011-03-15 NOTE — Patient Instructions (Signed)
MRI of the liver to be scheduled next week. Physician will contact you with results of blood test and MRI.

## 2011-03-16 ENCOUNTER — Encounter (INDEPENDENT_AMBULATORY_CARE_PROVIDER_SITE_OTHER): Payer: Self-pay | Admitting: *Deleted

## 2011-03-24 ENCOUNTER — Ambulatory Visit (HOSPITAL_COMMUNITY)
Admission: RE | Admit: 2011-03-24 | Discharge: 2011-03-24 | Disposition: A | Discharge status: Home / Self Care | Payer: Medicare Other | Source: Ambulatory Visit | Attending: Internal Medicine | Admitting: Internal Medicine

## 2011-03-24 DIAGNOSIS — R799 Abnormal finding of blood chemistry, unspecified: Secondary | ICD-10-CM | POA: Insufficient documentation

## 2011-03-24 DIAGNOSIS — K8020 Calculus of gallbladder without cholecystitis without obstruction: Secondary | ICD-10-CM | POA: Insufficient documentation

## 2011-03-24 DIAGNOSIS — B182 Chronic viral hepatitis C: Secondary | ICD-10-CM

## 2011-03-24 DIAGNOSIS — B1920 Unspecified viral hepatitis C without hepatic coma: Secondary | ICD-10-CM | POA: Insufficient documentation

## 2011-03-24 MED ORDER — GADOBENATE DIMEGLUMINE 529 MG/ML IV SOLN
15.0000 mL | Freq: Once | INTRAVENOUS | Status: AC | PRN
  Administered 2011-03-24: 15 mL via INTRAVENOUS

## 2011-04-05 ENCOUNTER — Encounter (INDEPENDENT_AMBULATORY_CARE_PROVIDER_SITE_OTHER): Payer: Self-pay | Admitting: Internal Medicine

## 2011-04-11 ENCOUNTER — Encounter (INDEPENDENT_AMBULATORY_CARE_PROVIDER_SITE_OTHER): Payer: Self-pay | Admitting: Internal Medicine

## 2011-04-12 ENCOUNTER — Telehealth (INDEPENDENT_AMBULATORY_CARE_PROVIDER_SITE_OTHER): Payer: Self-pay | Admitting: *Deleted

## 2011-04-12 DIAGNOSIS — K7460 Unspecified cirrhosis of liver: Secondary | ICD-10-CM

## 2011-04-12 NOTE — Telephone Encounter (Signed)
Per Dr. Karilyn Cota the patient will need AFP in 3 months.

## 2011-04-13 ENCOUNTER — Encounter (INDEPENDENT_AMBULATORY_CARE_PROVIDER_SITE_OTHER): Payer: Self-pay | Admitting: *Deleted

## 2011-06-24 ENCOUNTER — Encounter (INDEPENDENT_AMBULATORY_CARE_PROVIDER_SITE_OTHER): Payer: Self-pay | Admitting: *Deleted

## 2011-07-14 ENCOUNTER — Other Ambulatory Visit (INDEPENDENT_AMBULATORY_CARE_PROVIDER_SITE_OTHER): Payer: Self-pay | Admitting: Internal Medicine

## 2011-07-15 LAB — AFP TUMOR MARKER: AFP-Tumor Marker: 27.6 ng/mL — ABNORMAL HIGH (ref 0.0–8.0)

## 2011-09-06 ENCOUNTER — Encounter (INDEPENDENT_AMBULATORY_CARE_PROVIDER_SITE_OTHER): Payer: Self-pay

## 2011-10-24 ENCOUNTER — Telehealth (INDEPENDENT_AMBULATORY_CARE_PROVIDER_SITE_OTHER): Payer: Self-pay | Admitting: Internal Medicine

## 2011-10-24 NOTE — Telephone Encounter (Signed)
Patient's blood work from 10/13/2011 reviewed. CBC is normal. LFTs are also normal. Alpha-fetoprotein is 34 with AFP/L3% 6.6. Patient's AFP remains mildly elevated most likely secondary to underlying cirrhosis. MR liver in November 2012 is unremarkable. No further workup at this time. Please note patient has been successfully treated for HCV with SVR.

## 2011-10-27 ENCOUNTER — Telehealth (INDEPENDENT_AMBULATORY_CARE_PROVIDER_SITE_OTHER): Payer: Self-pay | Admitting: *Deleted

## 2011-10-27 DIAGNOSIS — B182 Chronic viral hepatitis C: Secondary | ICD-10-CM

## 2011-10-27 NOTE — Telephone Encounter (Signed)
Per Dr.Rehman after rev'ing labs from [facility], the patient will need to have repeat labs in 6 months. This has been noted for January 2013.

## 2011-11-18 ENCOUNTER — Encounter (INDEPENDENT_AMBULATORY_CARE_PROVIDER_SITE_OTHER): Payer: Self-pay

## 2012-04-06 ENCOUNTER — Telehealth (INDEPENDENT_AMBULATORY_CARE_PROVIDER_SITE_OTHER): Payer: Self-pay | Admitting: *Deleted

## 2012-04-06 ENCOUNTER — Encounter (INDEPENDENT_AMBULATORY_CARE_PROVIDER_SITE_OTHER): Payer: Self-pay | Admitting: *Deleted

## 2012-04-06 DIAGNOSIS — B182 Chronic viral hepatitis C: Secondary | ICD-10-CM

## 2012-04-06 NOTE — Telephone Encounter (Signed)
Lab order done 

## 2012-04-24 ENCOUNTER — Other Ambulatory Visit (INDEPENDENT_AMBULATORY_CARE_PROVIDER_SITE_OTHER): Payer: Self-pay | Admitting: Internal Medicine

## 2012-04-28 ENCOUNTER — Other Ambulatory Visit (INDEPENDENT_AMBULATORY_CARE_PROVIDER_SITE_OTHER): Payer: Self-pay | Admitting: Internal Medicine

## 2012-04-28 DIAGNOSIS — B1920 Unspecified viral hepatitis C without hepatic coma: Secondary | ICD-10-CM

## 2012-04-28 DIAGNOSIS — K7460 Unspecified cirrhosis of liver: Secondary | ICD-10-CM

## 2012-04-28 DIAGNOSIS — R772 Abnormality of alphafetoprotein: Secondary | ICD-10-CM

## 2012-05-02 ENCOUNTER — Ambulatory Visit (HOSPITAL_COMMUNITY): Payer: Medicare Other

## 2012-05-09 ENCOUNTER — Ambulatory Visit (HOSPITAL_COMMUNITY)
Admission: RE | Admit: 2012-05-09 | Discharge: 2012-05-09 | Disposition: A | Discharge status: Home / Self Care | Payer: Medicare Other | Source: Ambulatory Visit | Attending: Internal Medicine | Admitting: Internal Medicine

## 2012-05-09 DIAGNOSIS — K8010 Calculus of gallbladder with chronic cholecystitis without obstruction: Secondary | ICD-10-CM | POA: Insufficient documentation

## 2012-05-09 DIAGNOSIS — R772 Abnormality of alphafetoprotein: Secondary | ICD-10-CM

## 2012-05-09 DIAGNOSIS — B1920 Unspecified viral hepatitis C without hepatic coma: Secondary | ICD-10-CM | POA: Insufficient documentation

## 2012-05-09 DIAGNOSIS — K7460 Unspecified cirrhosis of liver: Secondary | ICD-10-CM

## 2012-05-15 NOTE — Progress Notes (Signed)
Apt has been scheduled for 08/07/12 with Dr. Karilyn Cota.

## 2012-08-07 ENCOUNTER — Ambulatory Visit (INDEPENDENT_AMBULATORY_CARE_PROVIDER_SITE_OTHER): Payer: Medicare Other | Admitting: Internal Medicine

## 2012-08-07 ENCOUNTER — Encounter (INDEPENDENT_AMBULATORY_CARE_PROVIDER_SITE_OTHER): Payer: Self-pay | Admitting: Internal Medicine

## 2012-08-07 VITALS — BP 120/80 | HR 72 | Temp 97.7°F | Resp 18 | Ht 64.0 in | Wt 151.9 lb

## 2012-08-07 DIAGNOSIS — K7460 Unspecified cirrhosis of liver: Secondary | ICD-10-CM

## 2012-08-07 DIAGNOSIS — Z8619 Personal history of other infectious and parasitic diseases: Secondary | ICD-10-CM

## 2012-08-07 DIAGNOSIS — R799 Abnormal finding of blood chemistry, unspecified: Secondary | ICD-10-CM

## 2012-08-07 DIAGNOSIS — B182 Chronic viral hepatitis C: Secondary | ICD-10-CM

## 2012-08-07 DIAGNOSIS — R772 Abnormality of alphafetoprotein: Secondary | ICD-10-CM

## 2012-08-07 NOTE — Patient Instructions (Signed)
Physician will contact you with results of blood work when completed. Next liver ultrasound in July 2014; office will schedule

## 2012-08-07 NOTE — Progress Notes (Signed)
Presenting complaint;  Followup for cirrhosis.  Subjective:  Patient is 58 year old African male who is here for scheduled visit. He was last seen 16 months ago. He's been getting schedule blood work and ultrasound as planned. He feels fine. He has good appetite. He has lost 14 pounds in the last 16 months. He states his eating habits have improved and he has not serve the food seemed should not eat. He denies abdominal pain diarrhea constipation melena or rectal bleeding. His heartburn is well controlled with therapy and he denies nausea vomiting or dysphagia. He stays busy. He is raising the garden he does walk couple of times a week. He has not had any alcohol in close to 10 years. He has history of biopsy proven cirrhosis secondary to chronic hepatitis C and treated for 48 weeks which she completed in 2010 with SVR.   Current Medications: Current Outpatient Prescriptions  Medication Sig Dispense Refill  . amLODipine (NORVASC) 5 MG tablet Take 5 mg by mouth daily.      Marland Kitchen aspirin 81 MG tablet Take 81 mg by mouth daily.        . Cholecalciferol (VITAMIN D) 2000 UNITS tablet Take 2,000 Units by mouth daily.      . citalopram (CELEXA) 40 MG tablet Take 40 mg by mouth daily.        Marland Kitchen glucagon (GLUCAGON EMERGENCY) 1 MG injection Inject 1 mg into the skin once as needed. Patient injects 1/2 mg as needed      . insulin detemir (LEVEMIR) 100 UNIT/ML injection Inject 40 Units into the skin at bedtime.      Marland Kitchen lisinopril-hydrochlorothiazide (PRINZIDE,ZESTORETIC) 20-12.5 MG per tablet Take 2 tablets by mouth daily. Take 2 tabs daily      . metFORMIN (GLUCOPHAGE) 1000 MG tablet Take 1,000 mg by mouth 2 (two) times daily with a meal.        . Multiple Vitamin (MULTIVITAMIN) tablet Take 1 tablet by mouth daily.      . nadolol (CORGARD) 80 MG tablet Take 80 mg by mouth daily.        . Omega-3 Fatty Acids (FISH OIL) 1000 MG CAPS Take 1,000 mg by mouth daily.      Marland Kitchen omeprazole (PRILOSEC) 20 MG capsule Take  20 mg by mouth daily.        . simvastatin (ZOCOR) 10 MG tablet Take 10 mg by mouth at bedtime.        . traZODone (DESYREL) 150 MG tablet Take 75 mg by mouth at bedtime.        No current facility-administered medications for this visit.     Objective: Blood pressure 120/80, pulse 72, temperature 97.7 F (36.5 C), temperature source Oral, resp. rate 18, height 5\' 4"  (1.626 m), weight 151 lb 14.4 oz (68.901 kg). Patient is alert and does not have asterixis. Conjunctiva is pink. Sclera is nonicteric Oropharyngeal mucosa is normal. No neck masses or thyromegaly noted. Cardiac exam with regular rhythm normal S1 and S2. No murmur or gallop noted. Lungs are clear to auscultation. Abdomen is soft and nontender without organomegaly or masses  No LE edema or clubbing noted.  Labs/data reviewed; Colonoscopy was in November 2010 revealing single diverticulum at hepatic flexure and 3 small polyps one of which was tubular adenoma. Alpha-fetoprotein level as follows. 28.2 in August 2012. 20.9 in December 2012. 27.6 in March 2013. 34.0 in June 2013. 35.7 in December 2013. MR liver in December 2012 revealed changes of cirrhosis without focal abnormalities.  Pancreatic abnormalities Consistent with chronic pancreatitis and cholelithiasis.  ultrasound 05/12/2012 revealed cholelithiasis mild gallbladder wall thickening in cirrhotic appearing liver without focal nodules    Assessment:  #1. Compensated cirrhosis. Etiology felt to be chronic hepatitis C which was successfully treated 4 years ago. #2. Mildly elevated AFP. This may be secondary to underlying cirrhosis and impaired clearance of AFP. There has been in significant increase in AFP levels over the last 20 months. #3. GERD.symptoms well controlled with PPI. #4. Asymptomatic cholelithiasis     Plan:  Patient will go to the lab for AFB and LFTs. Next liver ultrasound would be in July 2014. Next colonoscopy would be in November  2020.

## 2012-08-30 ENCOUNTER — Encounter (INDEPENDENT_AMBULATORY_CARE_PROVIDER_SITE_OTHER): Payer: Self-pay

## 2012-09-28 ENCOUNTER — Telehealth (INDEPENDENT_AMBULATORY_CARE_PROVIDER_SITE_OTHER): Payer: Self-pay | Admitting: *Deleted

## 2012-09-28 DIAGNOSIS — K7460 Unspecified cirrhosis of liver: Secondary | ICD-10-CM

## 2012-09-28 NOTE — Telephone Encounter (Signed)
Per Dr.Rehman the patient will need to have labs drawn 3 rd week in July.

## 2012-09-29 ENCOUNTER — Encounter (INDEPENDENT_AMBULATORY_CARE_PROVIDER_SITE_OTHER): Payer: Self-pay

## 2012-10-17 ENCOUNTER — Encounter (INDEPENDENT_AMBULATORY_CARE_PROVIDER_SITE_OTHER): Payer: Self-pay | Admitting: *Deleted

## 2012-10-18 ENCOUNTER — Telehealth (INDEPENDENT_AMBULATORY_CARE_PROVIDER_SITE_OTHER): Payer: Self-pay | Admitting: Internal Medicine

## 2012-10-18 ENCOUNTER — Other Ambulatory Visit (INDEPENDENT_AMBULATORY_CARE_PROVIDER_SITE_OTHER): Payer: Self-pay | Admitting: *Deleted

## 2012-10-18 ENCOUNTER — Encounter (INDEPENDENT_AMBULATORY_CARE_PROVIDER_SITE_OTHER): Payer: Self-pay | Admitting: *Deleted

## 2012-10-18 ENCOUNTER — Other Ambulatory Visit (INDEPENDENT_AMBULATORY_CARE_PROVIDER_SITE_OTHER): Payer: Self-pay | Admitting: Internal Medicine

## 2012-10-18 DIAGNOSIS — R768 Other specified abnormal immunological findings in serum: Secondary | ICD-10-CM

## 2012-10-18 DIAGNOSIS — K7460 Unspecified cirrhosis of liver: Secondary | ICD-10-CM

## 2012-10-18 NOTE — Telephone Encounter (Signed)
Needs CT abdomen/pelvis with CM 

## 2012-10-19 NOTE — Progress Notes (Signed)
CT sch'd 10/23/12 at 10:15 (10:00), patient aware -- spoke to Jodi Mourning at patient's home

## 2012-10-23 ENCOUNTER — Ambulatory Visit (HOSPITAL_COMMUNITY)
Admission: RE | Admit: 2012-10-23 | Discharge: 2012-10-23 | Disposition: A | Discharge status: Home / Self Care | Payer: Medicare Other | Source: Ambulatory Visit | Attending: Internal Medicine | Admitting: Internal Medicine

## 2012-10-23 DIAGNOSIS — R768 Other specified abnormal immunological findings in serum: Secondary | ICD-10-CM

## 2012-10-23 DIAGNOSIS — K861 Other chronic pancreatitis: Secondary | ICD-10-CM | POA: Insufficient documentation

## 2012-10-23 DIAGNOSIS — K8020 Calculus of gallbladder without cholecystitis without obstruction: Secondary | ICD-10-CM | POA: Insufficient documentation

## 2012-10-23 DIAGNOSIS — K7460 Unspecified cirrhosis of liver: Secondary | ICD-10-CM | POA: Insufficient documentation

## 2012-10-23 LAB — POCT I-STAT, CHEM 8
Chloride: 103 mEq/L (ref 96–112)
Creatinine, Ser: 1 mg/dL (ref 0.50–1.35)
Glucose, Bld: 189 mg/dL — ABNORMAL HIGH (ref 70–99)
HCT: 43 % (ref 39.0–52.0)
Potassium: 4.9 mEq/L (ref 3.5–5.1)
Sodium: 136 mEq/L (ref 135–145)

## 2012-10-23 MED ORDER — IOHEXOL 300 MG/ML  SOLN
100.0000 mL | Freq: Once | INTRAMUSCULAR | Status: AC | PRN
  Administered 2012-10-23: 100 mL via INTRAVENOUS

## 2012-10-23 NOTE — Progress Notes (Signed)
Blood sample obtained from left arm IV for Creatnine level.  

## 2012-10-24 ENCOUNTER — Encounter (INDEPENDENT_AMBULATORY_CARE_PROVIDER_SITE_OTHER): Payer: Self-pay

## 2013-01-24 ENCOUNTER — Encounter (INDEPENDENT_AMBULATORY_CARE_PROVIDER_SITE_OTHER): Payer: Self-pay | Admitting: *Deleted

## 2013-01-24 ENCOUNTER — Telehealth (INDEPENDENT_AMBULATORY_CARE_PROVIDER_SITE_OTHER): Payer: Self-pay | Admitting: *Deleted

## 2013-01-24 DIAGNOSIS — B182 Chronic viral hepatitis C: Secondary | ICD-10-CM

## 2013-01-24 NOTE — Telephone Encounter (Signed)
Per Dr.Rehman the patient will need to have labs drawn the 2nd week in October.

## 2013-01-29 ENCOUNTER — Encounter (INDEPENDENT_AMBULATORY_CARE_PROVIDER_SITE_OTHER): Payer: Self-pay

## 2013-01-30 ENCOUNTER — Telehealth (INDEPENDENT_AMBULATORY_CARE_PROVIDER_SITE_OTHER): Payer: Self-pay | Admitting: *Deleted

## 2013-01-30 NOTE — Telephone Encounter (Signed)
Elnita Maxwell from [facility] will be seeing Nicholas Becker on Friday, 02/02/13. Would like to know if she could draw his labs for out office. If so she will need an order and where to send all information. If the nurse could please return her call at [phone] ext. [phone].

## 2013-01-31 NOTE — Telephone Encounter (Signed)
I have called Nicholas Becker and left message asking that she call with the fax number that I am to fax order too. Once received I will fax the order to her.

## 2013-02-09 ENCOUNTER — Other Ambulatory Visit (INDEPENDENT_AMBULATORY_CARE_PROVIDER_SITE_OTHER): Payer: Self-pay | Admitting: Internal Medicine

## 2013-02-09 DIAGNOSIS — R772 Abnormality of alphafetoprotein: Secondary | ICD-10-CM

## 2013-02-09 DIAGNOSIS — B1920 Unspecified viral hepatitis C without hepatic coma: Secondary | ICD-10-CM

## 2013-02-09 DIAGNOSIS — K7460 Unspecified cirrhosis of liver: Secondary | ICD-10-CM

## 2013-02-14 ENCOUNTER — Other Ambulatory Visit (HOSPITAL_COMMUNITY): Payer: Medicare Other

## 2013-02-16 ENCOUNTER — Ambulatory Visit (HOSPITAL_COMMUNITY)
Admission: RE | Admit: 2013-02-16 | Discharge: 2013-02-16 | Disposition: A | Discharge status: Home / Self Care | Payer: Medicare Other | Source: Ambulatory Visit | Attending: Internal Medicine | Admitting: Internal Medicine

## 2013-02-16 DIAGNOSIS — R772 Abnormality of alphafetoprotein: Secondary | ICD-10-CM

## 2013-02-16 DIAGNOSIS — K7460 Unspecified cirrhosis of liver: Secondary | ICD-10-CM

## 2013-02-16 DIAGNOSIS — K769 Liver disease, unspecified: Secondary | ICD-10-CM | POA: Insufficient documentation

## 2013-02-16 DIAGNOSIS — B1920 Unspecified viral hepatitis C without hepatic coma: Secondary | ICD-10-CM

## 2013-02-16 LAB — POCT I-STAT CREATININE: Creatinine, Ser: 1.1 mg/dL (ref 0.50–1.35)

## 2013-02-16 MED ORDER — SODIUM CHLORIDE 0.9 % IV SOLN
INTRAVENOUS | Status: AC
  Filled 2013-02-16: qty 250

## 2013-02-16 MED ORDER — GADOBENATE DIMEGLUMINE 529 MG/ML IV SOLN
14.0000 mL | Freq: Once | INTRAVENOUS | Status: AC | PRN
  Administered 2013-02-16: 14 mL via INTRAVENOUS

## 2013-02-19 ENCOUNTER — Ambulatory Visit (INDEPENDENT_AMBULATORY_CARE_PROVIDER_SITE_OTHER): Payer: Medicare Other | Admitting: Internal Medicine

## 2013-02-19 NOTE — Progress Notes (Signed)
Apt has been scheduled for 02/20/13.

## 2013-02-20 ENCOUNTER — Encounter (INDEPENDENT_AMBULATORY_CARE_PROVIDER_SITE_OTHER): Payer: Self-pay | Admitting: Internal Medicine

## 2013-02-20 ENCOUNTER — Ambulatory Visit (INDEPENDENT_AMBULATORY_CARE_PROVIDER_SITE_OTHER): Payer: Medicare Other | Admitting: Internal Medicine

## 2013-02-20 VITALS — BP 100/58 | HR 64 | Temp 98.8°F | Ht 64.0 in | Wt 145.5 lb

## 2013-02-20 DIAGNOSIS — R9389 Abnormal findings on diagnostic imaging of other specified body structures: Secondary | ICD-10-CM

## 2013-02-20 DIAGNOSIS — B1920 Unspecified viral hepatitis C without hepatic coma: Secondary | ICD-10-CM

## 2013-02-20 DIAGNOSIS — C228 Malignant neoplasm of liver, primary, unspecified as to type: Secondary | ICD-10-CM

## 2013-02-20 DIAGNOSIS — R160 Hepatomegaly, not elsewhere classified: Secondary | ICD-10-CM

## 2013-02-20 DIAGNOSIS — K769 Liver disease, unspecified: Secondary | ICD-10-CM

## 2013-02-20 NOTE — Progress Notes (Signed)
Subjective:     Patient ID: Nicholas Becker, male   DOB: 1956-02-05, 57 y.o.   MRN: [id number]  HPI Nicholas Becker is a 57 yr old male here today for f/u of recent MRI abdomen. He was last seen in April of this year by Dr. Karilyn Cota. He has a history of biopsy proven cirrhosis secondary to chronic hepatitis C and was treated for 48 week in 2010 and has a SVR. Recent AFP was elevated in June 89. 4/14 AFP 58.7. 04/24/12 AFP 35.7 02/09/2013 MRI abdomen: Suspected cirrhosis with scattered small dysplastic nodules.  No findings specific for hepatocellular carcinoma. However, a 7 mm  suspected dysplastic nodule in the lateral segment left hepatic lobe  demonstrates restricted diffusion. Consider follow-up MRI abdomen  with/without contrast in 6 months.  Cholelithiasis, without associated inflammatory changes.    03/15/2011 MRI abdomen:  1. Cirrhosis without evidence of hepatocellular carcinoma.  2. Pancreatic findings which are favored to represent the sequelae  of chronic pancreatitis. Grossly similar to the 2006 exam. No  evidence of acute superimposed pancreatitis.  3. Cholelithiasis without cholecystitis.  4. Small amount of fluid/edema surrounding proximal jejunal loops.  Cannot exclude concurrent wall thickening. Correlate with symptoms  to suggest enteritis.   Appetite is good. He has lost 6 pounds since his last office visit in April. Says he feels fine.  No abdominal pain.  BMs are normal. No melena or bright red rectal bleeding. 12/06/2012 ALP 87, AST 30, ALT 18.        Review of Systems Current Outpatient Prescriptions  Medication Sig Dispense Refill  . amLODipine (NORVASC) 5 MG tablet Take 5 mg by mouth daily.      Marland Kitchen aspirin 81 MG tablet Take 81 mg by mouth daily.        . Cholecalciferol (VITAMIN D) 2000 UNITS tablet Take 2,000 Units by mouth daily.      . citalopram (CELEXA) 40 MG tablet Take 40 mg by mouth daily.        . insulin detemir (LEVEMIR) 100 UNIT/ML injection Inject 40  Units into the skin at bedtime.      . insulin lispro (HUMALOG) 100 UNIT/ML injection Inject 5 Units into the skin 3 (three) times daily before meals.      Marland Kitchen lisinopril-hydrochlorothiazide (PRINZIDE,ZESTORETIC) 20-12.5 MG per tablet Take 2 tablets by mouth daily. Take 2 tabs daily      . metFORMIN (GLUCOPHAGE) 1000 MG tablet Take 1,000 mg by mouth 2 (two) times daily with a meal.        . Multiple Vitamin (MULTIVITAMIN) tablet Take 1 tablet by mouth daily.      . nadolol (CORGARD) 80 MG tablet Take 80 mg by mouth daily.        . Omega-3 Fatty Acids (FISH OIL) 1000 MG CAPS Take 1,000 mg by mouth daily.      Marland Kitchen omeprazole (PRILOSEC) 20 MG capsule Take 20 mg by mouth daily.        . simvastatin (ZOCOR) 10 MG tablet Take 10 mg by mouth at bedtime.        . traZODone (DESYREL) 150 MG tablet Take 75 mg by mouth at bedtime.       Marland Kitchen glucagon (GLUCAGON EMERGENCY) 1 MG injection Inject 1 mg into the skin once as needed. Patient injects 1/2 mg as needed       No current facility-administered medications for this visit.   Past Medical History  Diagnosis Date  . Diabetes mellitus   . Hypertension  normal Bmet in 2012  . Hyperlipemia   . Chronic pancreatitis     calcified  . Tobacco abuse     1/2 pack per day  . Chronic hepatitis C with cirrhosis 2006    Secondary to hepatitis C and ethanol abuse, which is now in remission; varices-status post banding at [facility]; normal LFTs in 2009; thrombocytopenia In the past, platelets-153,000 in 2012  . Cholelithiasis 2009  . Bronchiectasis 2009    Left lower lobe; normal chest x-ray in 2012  . Cataracts, bilateral   . Overweight(278.02)   . Positive PPD     Treated with INH  . Nephrolithiasis 12/31/2010  . Substance abuse     Cocaine, marijuana, alcohol   Past Surgical History  Procedure Laterality Date  . Percutaneous liver biopsy  2006   No Known Allergies     Objective:   Physical Exam  Filed Vitals:   02/20/13 1422  BP: 100/58  Pulse: 64   Temp: 98.8 F (37.1 C)  Height: 5\' 4"  (1.626 m)  Weight: 145 lb 8 oz (65.998 kg)  Alert and oriented. Skin warm and dry. Oral mucosa is moist.   . Sclera anicteric, conjunctivae is pink. Thyroid not enlarged. No cervical lymphadenopathy. Lungs clear. Heart regular rate and rhythm.  Abdomen is soft. Bowel sounds are positive. No hepatomegaly. No abdominal masses felt. No tenderness.  No edema to lower extremities.        Assessment:    Elevated AFP and abnormal MRI abdomen. I discussed this case with Dr. Karilyn Cota,.  Hx of Hepatitis C and SVR in 2010 with treatment.     Plan:    AFP today. MRI abdomen with and without contrast. in 6 months for f/u [name] will put him on a recall for the MRI)

## 2013-02-20 NOTE — Patient Instructions (Addendum)
AFP today. OV in 6 months with an MRI and AFP

## 2013-02-21 ENCOUNTER — Telehealth (INDEPENDENT_AMBULATORY_CARE_PROVIDER_SITE_OTHER): Payer: Self-pay | Admitting: *Deleted

## 2013-02-21 DIAGNOSIS — R160 Hepatomegaly, not elsewhere classified: Secondary | ICD-10-CM

## 2013-02-21 DIAGNOSIS — B1920 Unspecified viral hepatitis C without hepatic coma: Secondary | ICD-10-CM

## 2013-02-21 DIAGNOSIS — K7460 Unspecified cirrhosis of liver: Secondary | ICD-10-CM

## 2013-02-21 NOTE — Telephone Encounter (Signed)
.  Per Delrae Rend to have done in 3 months

## 2013-02-22 ENCOUNTER — Telehealth (INDEPENDENT_AMBULATORY_CARE_PROVIDER_SITE_OTHER): Payer: Self-pay | Admitting: *Deleted

## 2013-02-22 DIAGNOSIS — B182 Chronic viral hepatitis C: Secondary | ICD-10-CM

## 2013-02-22 NOTE — Telephone Encounter (Signed)
Per Dr.Rehman the patient will need to have labs drawn in 3 months 

## 2013-03-02 ENCOUNTER — Encounter (INDEPENDENT_AMBULATORY_CARE_PROVIDER_SITE_OTHER): Payer: Self-pay

## 2013-04-05 ENCOUNTER — Encounter (INDEPENDENT_AMBULATORY_CARE_PROVIDER_SITE_OTHER): Payer: Self-pay | Admitting: *Deleted

## 2013-04-05 ENCOUNTER — Other Ambulatory Visit (INDEPENDENT_AMBULATORY_CARE_PROVIDER_SITE_OTHER): Payer: Self-pay | Admitting: *Deleted

## 2013-04-05 DIAGNOSIS — R160 Hepatomegaly, not elsewhere classified: Secondary | ICD-10-CM

## 2013-04-05 DIAGNOSIS — B1920 Unspecified viral hepatitis C without hepatic coma: Secondary | ICD-10-CM

## 2013-04-05 DIAGNOSIS — K7460 Unspecified cirrhosis of liver: Secondary | ICD-10-CM

## 2013-04-05 DIAGNOSIS — B182 Chronic viral hepatitis C: Secondary | ICD-10-CM

## 2013-05-08 ENCOUNTER — Ambulatory Visit (INDEPENDENT_AMBULATORY_CARE_PROVIDER_SITE_OTHER): Payer: Medicare Other | Admitting: Internal Medicine

## 2013-05-08 ENCOUNTER — Encounter (INDEPENDENT_AMBULATORY_CARE_PROVIDER_SITE_OTHER): Payer: Self-pay | Admitting: Internal Medicine

## 2013-05-08 VITALS — BP 130/82 | HR 64 | Temp 97.5°F | Resp 16 | Ht 64.0 in | Wt 150.8 lb

## 2013-05-08 DIAGNOSIS — R772 Abnormality of alphafetoprotein: Secondary | ICD-10-CM

## 2013-05-08 DIAGNOSIS — R9389 Abnormal findings on diagnostic imaging of other specified body structures: Secondary | ICD-10-CM

## 2013-05-08 DIAGNOSIS — K7460 Unspecified cirrhosis of liver: Secondary | ICD-10-CM

## 2013-05-08 DIAGNOSIS — R799 Abnormal finding of blood chemistry, unspecified: Secondary | ICD-10-CM

## 2013-05-08 NOTE — Progress Notes (Signed)
Presenting complaint;  Follow up for cirrhosis and rising AFP levels.  Database;  Patient is 58 year old African male who has history of cirrhosis secondary to chronic hepatitis C. genotype 1 who was treated for 48 weeks and finished treatment in November 2007 with SVR.  He underwent liver biopsy in October 2006 revealing changes of chronic hepatitis C and cirrhosis. He also has history of mildly elevated AFP levels. His first MRI of the liver was in June 2006 and was unremarkable. He has had periodic imaging studies since then. His alpha-fetoprotein was 20.9 in February 2012 and it was 28.2 in August, 2012. He had MRI of the liver in December 2012 revealing changes of cirrhosis without focal abnormalities, cholelithiasis and changes of chronic pancreatitis. Alpha-fetoprotein in December 2013 was 35.7. He had upper abdominal ultrasound in January 2014 revealing cirrhotic appearing liver but no focal abnormalities noted. Once again he was noted to have cholelithiasis and mild wall thickening but no evidence of pericholecystic fluid. He had another CT abdomen in June 2014 relayed daughter agree to her 2 liver but no enhancing lesions suggest hepatoma. Alpha-fetoprotein jump 200.3 on 02/02/2013. He had MRI of liver. This study reveals scattered tiny hypervascular foci in arterial phase. One in lateral segment of left hepatic lobe measured 7 mm. These lesions are felt to be dysplastic nodule. On delayed images 12 x 9 mm lesion noted in the medial segment of left hepatic lobe. There was no arterial enhancement this lesion was felt not to be neoplastic. Once again cholelithiasis noted and pancreas appear to be unremarkable.  His last colonoscopy was in November 2010 with removal of 2 small polyps and one was tubular adenoma.   Subjective:  Patient is here for scheduled visit accompanied by staff member from the rest home. He has no complaints. He has good appetite and his weight has remained stable  since last visit of April 2014. He has very good appetite. He denies nausea vomiting abdominal pain melena or rectal bleeding. His bowels move regularly. He states his glucose levels have been fluctuating. He has not been walking or exercising lately. He has not had any alcohol in at least 10 years.   Current Medications: Current Outpatient Prescriptions  Medication Sig Dispense Refill  . amLODipine (NORVASC) 5 MG tablet Take 5 mg by mouth daily.      Marland Kitchen aspirin 81 MG tablet Take 81 mg by mouth daily.        . Cholecalciferol (VITAMIN D) 2000 UNITS tablet Take 2,000 Units by mouth daily.      . citalopram (CELEXA) 40 MG tablet Take 40 mg by mouth daily.        . insulin detemir (LEVEMIR) 100 UNIT/ML injection Inject 41 Units into the skin.       Marland Kitchen insulin lispro (HUMALOG) 100 UNIT/ML injection Inject 5 Units into the skin 3 (three) times daily before meals.      Marland Kitchen lisinopril-hydrochlorothiazide (PRINZIDE,ZESTORETIC) 20-12.5 MG per tablet Take 2 tablets by mouth daily. Take 2 tabs daily      . metFORMIN (GLUCOPHAGE) 1000 MG tablet Take 1,000 mg by mouth 2 (two) times daily with a meal.        . Multiple Vitamin (MULTIVITAMIN) tablet Take 1 tablet by mouth daily.      . nadolol (CORGARD) 80 MG tablet Take 80 mg by mouth daily.        . Omega-3 Fatty Acids (FISH OIL) 1000 MG CAPS Take 1,000 mg by mouth daily.      Marland Kitchen  omeprazole (PRILOSEC) 20 MG capsule Take 20 mg by mouth daily.        . simvastatin (ZOCOR) 10 MG tablet Take 10 mg by mouth at bedtime.        . traZODone (DESYREL) 150 MG tablet Take 75 mg by mouth at bedtime.       Marland Kitchen glucagon (GLUCAGON EMERGENCY) 1 MG injection Inject 1 mg into the skin once as needed. Patient injects 1/2 mg as needed       No current facility-administered medications for this visit.     Objective: Blood pressure 130/82, pulse 64, temperature 97.5 F (36.4 C), temperature source Oral, resp. rate 16, height 5\' 4"  (1.626 m), weight 150 lb 12.8 oz (68.402  kg). Patient is alert and does not have asterixis. Conjunctiva is pink. Sclera is nonicteric Oropharyngeal mucosa is normal. No neck masses or thyromegaly noted. Cardiac exam with regular rhythm normal S1 and S2. No murmur or gallop noted. Lungs are clear to auscultation. Abdomen is full but soft and nontender without organomegaly or masses.  No LE edema or clubbing noted.  Labs/studies Results: Lab data from 04/05/2013. WBC 6.0, H&H 17.1 and 47.0 and platelet count 214K. Bilirubin 0.4, BP 79, AST 25, ALT 18, total protein 7.8 and albumin 4.9. Electrolytes within normal limits. BUN 10, creatinine 0.84, glucose 59 and calcium 10.2 . Alpha-fetoprotein 177.7. AFP was 99.1 on 02/20/2013.       MR Abdomen W Wo Contrast Status: Final result         PACS Images    Show images for MR Abdomen W Wo Contrast         Study Result    CLINICAL DATA: Hepatitis C, cirrhosis, elevated AFP  EXAM:  MRI ABDOMEN WITH AND WITHOUT CONTRAST  TECHNIQUE:  Multiplanar multisequence MR imaging of the abdomen was performed  both before and after administration of intravenous contrast.  CONTRAST: 51mL MULTIHANCE GADOBENATE DIMEGLUMINE 529 MG/ML IV SOLN  COMPARISON: CT abdomen pelvis dated 10/23/2012. MRI abdomen dated  03/24/2011.  FINDINGS:  Mildly nodular hepatic contour, reflecting cirrhosis. Scattered tiny  hypervascular foci on early arterial imaging (series 10/images 30,  32, 34, and 53), measuring up to 7 mm in the lateral segment left  hepatic lobe. These cannot be distinguished from background liver on  additional post-contrast sequences and are favored to reflect  dysplastic nodules. Two of these lesions in the lateral segment left  hepatic lobe demonstrate restricted diffusion (series 6/ images  14-15).  On delayed imaging, a very subtle 12 x 9 mm lesion with washout is  possible in the medial segment left hepatic lobe (series 18/ image  39), although this is very equivocal and  is not favored to reflect a  true mass. The area does not demonstrate definite arterial  enhancement and there is restricted diffusion.  No findings specific for hepatocellular carcinoma.  Spleen is normal in size.  Portal vein is patent.  Pancreas and adrenal glands are within normal limits.  Multiple gallstones, without associated inflammatory changes. No  intrahepatic or extrahepatic ductal dilatation.  Kidneys are within normal limits. No hydronephrosis.  No abdominal ascites.  No suspicious abdominal lymphadenopathy.  No focal osseous lesions.  IMPRESSION:  Suspected cirrhosis with scattered small dysplastic nodules.  No findings specific for hepatocellular carcinoma. However, a 7 mm  suspected dysplastic nodule in the lateral segment left hepatic lobe  demonstrates restricted diffusion. Consider follow-up MRI abdomen  with/without contrast in 6 months.  Cholelithiasis, without associated inflammatory changes.  Electronically Signed  By: Julian Hy M.D.     Assessment:  #1. Patient has 6 small hepatic lesions with arterial enhancement measuring 7 mm or less. Furthermore there is another 12 x 9 mm lesion seen in the washout phase without arterial enhancement. With rising AFP levels these lesions are of concern for hepatocellular carcinoma. He will need to be closely followed and the lesions that keep growing will need to be treated. It is interesting to note that he has had mildly elevated AFP since 2006 but MRI in 2006 and 2012 reveals no abnormality. #2. Cirrhosis secondary to chronic hepatitis C successfully treated in 2007. He has well preserved hepatic function. .   Plan:  MRI of liver with contrast later this month 3 months from prior study. Office visit in 3 months.

## 2013-05-08 NOTE — Patient Instructions (Signed)
MRI of liver to be scheduled on 05/23/2013.

## 2013-05-09 ENCOUNTER — Telehealth (INDEPENDENT_AMBULATORY_CARE_PROVIDER_SITE_OTHER): Payer: Self-pay | Admitting: *Deleted

## 2013-05-09 DIAGNOSIS — R772 Abnormality of alphafetoprotein: Secondary | ICD-10-CM

## 2013-05-09 DIAGNOSIS — K7460 Unspecified cirrhosis of liver: Secondary | ICD-10-CM

## 2013-05-09 DIAGNOSIS — Z0189 Encounter for other specified special examinations: Secondary | ICD-10-CM

## 2013-05-09 NOTE — Telephone Encounter (Signed)
Per Dr.Rehman the patient will need to have labs drawn prior to MRI,05/23/13.

## 2013-05-10 NOTE — Telephone Encounter (Signed)
Completed and faxed to [facility].

## 2013-05-23 ENCOUNTER — Ambulatory Visit (HOSPITAL_COMMUNITY)
Admission: RE | Admit: 2013-05-23 | Discharge: 2013-05-23 | Disposition: A | Discharge status: Home / Self Care | Payer: Medicare Other | Source: Ambulatory Visit | Attending: Internal Medicine | Admitting: Internal Medicine

## 2013-05-23 DIAGNOSIS — K861 Other chronic pancreatitis: Secondary | ICD-10-CM | POA: Insufficient documentation

## 2013-05-23 DIAGNOSIS — K7689 Other specified diseases of liver: Secondary | ICD-10-CM | POA: Insufficient documentation

## 2013-05-23 DIAGNOSIS — R7989 Other specified abnormal findings of blood chemistry: Secondary | ICD-10-CM | POA: Insufficient documentation

## 2013-05-23 DIAGNOSIS — R9389 Abnormal findings on diagnostic imaging of other specified body structures: Secondary | ICD-10-CM

## 2013-05-23 DIAGNOSIS — R772 Abnormality of alphafetoprotein: Secondary | ICD-10-CM

## 2013-05-23 DIAGNOSIS — K8020 Calculus of gallbladder without cholecystitis without obstruction: Secondary | ICD-10-CM | POA: Insufficient documentation

## 2013-05-23 DIAGNOSIS — K7460 Unspecified cirrhosis of liver: Secondary | ICD-10-CM | POA: Insufficient documentation

## 2013-05-23 MED ORDER — GADOBENATE DIMEGLUMINE 529 MG/ML IV SOLN
14.0000 mL | Freq: Once | INTRAVENOUS | Status: AC | PRN
  Administered 2013-05-23: 14 mL via INTRAVENOUS

## 2013-06-04 ENCOUNTER — Telehealth (INDEPENDENT_AMBULATORY_CARE_PROVIDER_SITE_OTHER): Payer: Self-pay | Admitting: Internal Medicine

## 2013-06-04 NOTE — Telephone Encounter (Signed)
Followup call to [facility] at the facility. Dr. Kathlene Cote has reviewed MRI and suggested biopsy of lesion in the left lobe for confirmation before therapy considered. Other lesions to be followed.

## 2013-06-06 ENCOUNTER — Other Ambulatory Visit (INDEPENDENT_AMBULATORY_CARE_PROVIDER_SITE_OTHER): Payer: Self-pay | Admitting: Internal Medicine

## 2013-06-06 DIAGNOSIS — K769 Liver disease, unspecified: Secondary | ICD-10-CM

## 2013-06-06 NOTE — Telephone Encounter (Signed)
Order placed in EPIC, someone from [facility] will call patient to schedule

## 2013-06-07 ENCOUNTER — Other Ambulatory Visit: Payer: Self-pay | Admitting: Radiology

## 2013-06-11 ENCOUNTER — Encounter (HOSPITAL_COMMUNITY): Payer: Self-pay | Admitting: Pharmacy Technician

## 2013-06-14 ENCOUNTER — Other Ambulatory Visit: Payer: Self-pay | Admitting: Radiology

## 2013-06-14 ENCOUNTER — Ambulatory Visit (HOSPITAL_COMMUNITY): Admission: RE | Admit: 2013-06-14 | Payer: Medicare Other | Source: Ambulatory Visit

## 2013-06-19 ENCOUNTER — Ambulatory Visit (HOSPITAL_COMMUNITY): Admission: RE | Admit: 2013-06-19 | Payer: Medicare Other | Source: Ambulatory Visit

## 2013-06-21 ENCOUNTER — Encounter (HOSPITAL_COMMUNITY): Payer: Self-pay | Admitting: Pharmacy Technician

## 2013-06-21 ENCOUNTER — Other Ambulatory Visit: Payer: Self-pay | Admitting: Radiology

## 2013-06-25 ENCOUNTER — Other Ambulatory Visit (INDEPENDENT_AMBULATORY_CARE_PROVIDER_SITE_OTHER): Payer: Self-pay | Admitting: Internal Medicine

## 2013-06-25 ENCOUNTER — Ambulatory Visit (HOSPITAL_COMMUNITY)
Admission: RE | Admit: 2013-06-25 | Discharge: 2013-06-25 | Disposition: A | Discharge status: Home / Self Care | Payer: Medicare Other | Source: Ambulatory Visit | Attending: Internal Medicine | Admitting: Internal Medicine

## 2013-06-25 ENCOUNTER — Encounter (HOSPITAL_COMMUNITY): Payer: Self-pay

## 2013-06-25 VITALS — BP 159/81 | HR 52 | Temp 98.0°F | Resp 14 | Ht 64.0 in | Wt 140.0 lb

## 2013-06-25 DIAGNOSIS — K769 Liver disease, unspecified: Secondary | ICD-10-CM

## 2013-06-25 LAB — PROTIME-INR
INR: 1.1 (ref 0.00–1.49)
Prothrombin Time: 14 seconds (ref 11.6–15.2)

## 2013-06-25 LAB — APTT: aPTT: 32 seconds (ref 24–37)

## 2013-06-25 LAB — CBC
HCT: 45.7 % (ref 39.0–52.0)
Hemoglobin: 16.4 g/dL (ref 13.0–17.0)
MCH: 32.3 pg (ref 26.0–34.0)
MCHC: 35.9 g/dL (ref 30.0–36.0)
MCV: 90 fL (ref 78.0–100.0)
Platelets: 155 10*3/uL (ref 150–400)
RBC: 5.08 MIL/uL (ref 4.22–5.81)
RDW: 13 % (ref 11.5–15.5)
WBC: 6.7 10*3/uL (ref 4.0–10.5)

## 2013-06-25 LAB — GLUCOSE, CAPILLARY: Glucose-Capillary: 69 mg/dL — ABNORMAL LOW (ref 70–99)

## 2013-06-25 MED ORDER — SODIUM CHLORIDE 0.9 % IV SOLN: INTRAVENOUS | Status: DC

## 2013-06-25 MED ORDER — MIDAZOLAM HCL 2 MG/2ML IJ SOLN
INTRAMUSCULAR | Status: AC
  Filled 2013-06-25: qty 4

## 2013-06-25 MED ORDER — FENTANYL CITRATE 0.05 MG/ML IJ SOLN
INTRAMUSCULAR | Status: AC
  Filled 2013-06-25: qty 4

## 2013-06-25 NOTE — H&P (Signed)
Chief Complaint: "I'm here for a liver biopsy" Referring Physician:Rehman HPI: Nicholas Becker is an 58 y.o. male with findings of suspicious lesion in the left lobe of the liver. MRI has been reviewed and feels indicated for biopsy of lesion. He is scheduled today for this procedure.  PMHx and current meds list reviewed. Feels well, otherwise. Ate early breakfast before 8am.  Past Medical History:  Past Medical History  Diagnosis Date  . Diabetes mellitus   . Hypertension     normal Bmet in 2012  . Hyperlipemia   . Chronic pancreatitis     calcified  . Tobacco abuse     1/2 pack per day  . Chronic hepatitis C with cirrhosis 2006    Secondary to hepatitis C and ethanol abuse, which is now in remission; varices-status post banding at [facility]; normal LFTs in 2009; thrombocytopenia In the past, platelets-153,000 in 2012  . Cholelithiasis 2009  . Bronchiectasis 2009    Left lower lobe; normal chest x-ray in 2012  . Cataracts, bilateral   . Overweight   . Positive PPD     Treated with INH  . Nephrolithiasis 12/31/2010  . Substance abuse     Cocaine, marijuana, alcohol    Past Surgical History:  Past Surgical History  Procedure Laterality Date  . Percutaneous liver biopsy  2006    Family History:  Family History  Problem Relation Age of Onset  . Healthy Sister   . Lung cancer Brother   . Healthy Sister   . Healthy Sister   . Healthy Sister   . Healthy Brother   . Healthy Brother   . Diabetes Daughter   . Healthy Son     Social History:  reports that he has been smoking Cigarettes.  He has been smoking about 0.00 packs per day. He has never used smokeless tobacco. He reports that he does not drink alcohol or use illicit drugs.  Allergies: No Known Allergies  Medications:   Medication List    ASK your doctor about these medications       amLODipine 5 MG tablet  Commonly known as:  NORVASC  Take 5 mg by mouth every morning.     aspirin 81 MG chewable tablet   Chew 81 mg by mouth every morning.     citalopram 40 MG tablet  Commonly known as:  CELEXA  Take 40 mg by mouth every morning.     Fish Oil 1000 MG Caps  Take 1,000 mg by mouth daily.     GLUCAGON EMERGENCY 1 MG injection  Generic drug:  glucagon  Inject 0.5 mg into the skin once as needed.     insulin lispro 100 UNIT/ML injection  Commonly known as:  HUMALOG  Inject 5 Units into the skin 3 (three) times daily before meals.     LEVEMIR 100 UNIT/ML injection  Generic drug:  insulin detemir  Inject 41 Units into the skin at bedtime.     lisinopril-hydrochlorothiazide 20-12.5 MG per tablet  Commonly known as:  PRINZIDE,ZESTORETIC  Take 2 tablets by mouth every morning. Take 2 tabs daily     metFORMIN 1000 MG tablet  Commonly known as:  GLUCOPHAGE  Take 1,000 mg by mouth 2 (two) times daily with a meal.     multivitamin tablet  Take 1 tablet by mouth daily.     nadolol 80 MG tablet  Commonly known as:  CORGARD  Take 80 mg by mouth every morning.     omeprazole  20 MG capsule  Commonly known as:  PRILOSEC  Take 20 mg by mouth daily.     simvastatin 10 MG tablet  Commonly known as:  ZOCOR  Take 10 mg by mouth at bedtime.     traZODone 150 MG tablet  Commonly known as:  DESYREL  Take 75 mg by mouth at bedtime.     Vitamin D 2000 UNITS tablet  Take 2,000 Units by mouth daily.        Please HPI for pertinent positives, otherwise complete 10 system ROS negative.  Physical Exam: BP 153/78  Pulse 54  Temp(Src) 98 F (36.7 C) (Oral)  Resp 17  Ht 5\' 4"  (1.626 m)  Wt 140 lb (63.504 kg)  BMI 24.02 kg/m2  SpO2 100% Body mass index is 24.02 kg/(m^2).   General Appearance:  Alert, cooperative, no distress, appears stated age  Head:  Normocephalic, without obvious abnormality, atraumatic  ENT: Unremarkable  Neck: Supple, symmetrical, trachea midline  Lungs:   Clear to auscultation bilaterally, no w/r/r, respirations unlabored without use of accessory muscles.   Chest Wall:  No tenderness or deformity  Heart:  Regular rate and rhythm, S1, S2 normal, no murmur, rub or gallop.  Abdomen:   Soft, non-tender, non distended.  Extremities: Extremities normal, atraumatic, no cyanosis or edema  Pulses: 2+ and symmetric  Neurologic: Normal affect, no gross deficits.   Results for orders placed during the hospital encounter of 06/25/13 (from the past 48 hour(s))  APTT     Status: None   Collection Time    06/25/13  1:04 PM      Result Value Ref Range   aPTT 32  24 - 37 seconds  CBC     Status: None   Collection Time    06/25/13  1:04 PM      Result Value Ref Range   WBC 6.7  4.0 - 10.5 K/uL   RBC 5.08  4.22 - 5.81 MIL/uL   Hemoglobin 16.4  13.0 - 17.0 g/dL   HCT 45.7  39.0 - 52.0 %   MCV 90.0  78.0 - 100.0 fL   MCH 32.3  26.0 - 34.0 pg   MCHC 35.9  30.0 - 36.0 g/dL   RDW 13.0  11.5 - 15.5 %   Platelets 155  150 - 400 K/uL  PROTIME-INR     Status: None   Collection Time    06/25/13  1:04 PM      Result Value Ref Range   Prothrombin Time 14.0  11.6 - 15.2 seconds   INR 1.10  0.00 - 1.49   No results found.  Assessment/Plan Left lobe liver lesions, elevated AFP concerning for poss [facility] Discussed plan for US guided liver lesion biopsy today. Explained procedure, risks, complications, use of sedation. Labs reviewed, ok Consent signed in chart  Ascencion Dike PA-C 06/25/2013, 2:14 PM

## 2013-06-25 NOTE — Procedures (Signed)
Subtle lesions in the left lobe by MRI cant be localized with [place] Therefore, bx not performed

## 2013-06-26 ENCOUNTER — Ambulatory Visit (HOSPITAL_COMMUNITY): Payer: Medicare Other

## 2013-08-06 ENCOUNTER — Ambulatory Visit (INDEPENDENT_AMBULATORY_CARE_PROVIDER_SITE_OTHER): Payer: Medicare Other | Admitting: Internal Medicine

## 2013-08-28 ENCOUNTER — Encounter (INDEPENDENT_AMBULATORY_CARE_PROVIDER_SITE_OTHER): Payer: Self-pay | Admitting: Internal Medicine

## 2013-08-28 ENCOUNTER — Ambulatory Visit (INDEPENDENT_AMBULATORY_CARE_PROVIDER_SITE_OTHER): Payer: Medicare Other | Admitting: Internal Medicine

## 2013-08-28 ENCOUNTER — Telehealth (INDEPENDENT_AMBULATORY_CARE_PROVIDER_SITE_OTHER): Payer: Self-pay | Admitting: Internal Medicine

## 2013-08-28 VITALS — BP 94/50 | HR 60 | Temp 98.5°F | Ht 64.0 in | Wt 145.7 lb

## 2013-08-28 DIAGNOSIS — K7460 Unspecified cirrhosis of liver: Secondary | ICD-10-CM

## 2013-08-28 DIAGNOSIS — B1920 Unspecified viral hepatitis C without hepatic coma: Secondary | ICD-10-CM

## 2013-08-28 DIAGNOSIS — K7689 Other specified diseases of liver: Secondary | ICD-10-CM

## 2013-08-28 DIAGNOSIS — K769 Liver disease, unspecified: Secondary | ICD-10-CM

## 2013-08-28 LAB — COMPREHENSIVE METABOLIC PANEL
ALBUMIN: 4.1 g/dL (ref 3.5–5.2)
ALT: 11 U/L (ref 0–53)
AST: 19 U/L (ref 0–37)
Alkaline Phosphatase: 62 U/L (ref 39–117)
BUN: 9 mg/dL (ref 6–23)
CALCIUM: 9.3 mg/dL (ref 8.4–10.5)
CO2: 28 mEq/L (ref 19–32)
Chloride: 100 mEq/L (ref 96–112)
Creat: 0.9 mg/dL (ref 0.50–1.35)
GLUCOSE: 145 mg/dL — AB (ref 70–99)
POTASSIUM: 4.2 meq/L (ref 3.5–5.3)
Sodium: 140 mEq/L (ref 135–145)
Total Bilirubin: 0.4 mg/dL (ref 0.2–1.2)
Total Protein: 6.5 g/dL (ref 6.0–8.3)

## 2013-08-28 NOTE — Progress Notes (Signed)
Subjective:     Patient ID: Nicholas Becker, male   DOB: [birth year], 58 y.o.   MRN: [id number]  HPI Here today for follow up. He was last seen in January of this year by Dr. Laural Golden,.  Hx of cirrhosis secondary to chronic hepatitis C. Genotype 1. Treated for 48 weeks in November 2007 with SVR. Treated with Pegasys and Ribavirin.  He underwent liver biopsy in October 2006 revealing changes of chronic hepatitis C and cirrhosis.  He also has history of mildly elevated AFP levels. His first MRI of the liver was in June 2006 and was unremarkable. He has had periodic imaging studies since then.    He had MRI of the liver in December 2012 revealing changes of cirrhosis without focal abnormalities, cholelithiasis and changes of chronic pancreatitis.    He had upper abdominal ultrasound in January 2014 revealing cirrhotic appearing liver but no focal abnormalities noted.  He had another CT abdomen in June 2014 relayed daughter agree to her 2 liver but no enhancing lesions suggest hepatoma.    He had MRI of liver. This study reveals scattered tiny hypervascular foci in arterial phase. One in lateral segment of left hepatic lobe measured 7 mm. These lesions are felt to be dysplastic nodule. On delayed images 12 x 9 mm lesion noted in the medial segment of left hepatic lobe. There was no arterial enhancement this lesion was felt not to be neoplastic.  His last colonoscopy was in November 2010 with removal of 2 small polyps and one was tubular adenoma. 07/14/2011 AFP 27.6 04/24/2012  AFP 35.7 02/20/2013 AFP 99.1 06/18/2010 AFP 20.9 06/06/2013 Liver biopsy:    IMPRESSION:  Left hepatic lobe lateral segment subtle lesions by MRI could not be  confidently visualized by ultrasound. Therefore biopsy was not  performed.  on 06/25/2013.  CMP     Component Value Date/Time   NA 136 10/23/2012 1025   K 4.9 10/23/2012 1025   CL 103 10/23/2012 1025   CO2 23 06/19/2010 2011   GLUCOSE 189* 10/23/2012 1025   BUN 11  10/23/2012 1025   CREATININE 1.10 02/16/2013 0826   CREATININE 0.97 03/15/2011 1324   CALCIUM 9.6 06/19/2010 2011   PROT 6.3 02/17/2008 1452   ALBUMIN 3.7 02/17/2008 1452   AST 29 02/17/2008 1452   ALT 23 02/17/2008 1452   ALKPHOS 77 02/17/2008 1452   BILITOT 0.7 02/17/2008 1452   GFRNONAA >60 06/19/2010 2011   GFRAA  Value: >60        The eGFR has been calculated using the MDRD equation. This calculation has not been validated in all clinical situations. eGFR's persistently <60 mL/min signify possible Chronic Kidney Disease. 06/19/2010 2011   CBC    Component Value Date/Time   WBC 6.7 06/25/2013 1304   RBC 5.08 06/25/2013 1304   HGB 16.4 06/25/2013 1304   HCT 45.7 06/25/2013 1304   PLT 155 06/25/2013 1304   MCV 90.0 06/25/2013 1304   MCH 32.3 06/25/2013 1304   MCHC 35.9 06/25/2013 1304   RDW 13.0 06/25/2013 1304   LYMPHSABS 1.4 06/19/2010 2011   MONOABS 0.6 06/19/2010 2011   EOSABS 0.0 06/19/2010 2011   BASOSABS 0.0 06/19/2010 2011        05/29/2013 MRI abdomen: 1. 2 foci of signal abnormality within the lateral segment left  liver lobe. Apparent slight increase in conspicuity since the prior  exam. These do not have the typical characteristics of  hepatocellular carcinoma or carcinomas. Favor dysplastic nodules  versus  very early hepatocellular carcinomas. Recommend surveillance  with MRI at 6 months.  2. Cirrhosis without evidence of portal venous hypertension.  3. Cholelithiasis.  4. Findings of chronic pancreatitis.    Review of Systems Past Medical History  Diagnosis Date  . Diabetes mellitus   . Hypertension     normal Bmet in 2012  . Hyperlipemia   . Chronic pancreatitis     calcified  . Tobacco abuse     1/2 pack per day  . Chronic hepatitis C with cirrhosis 2006    Secondary to hepatitis C and ethanol abuse, which is now in remission; varices-status post banding at [facility]; normal LFTs in 2009; thrombocytopenia In the past, platelets-153,000 in 2012  . Cholelithiasis 2009  .  Bronchiectasis 2009    Left lower lobe; normal chest x-ray in 2012  . Cataracts, bilateral   . Overweight   . Positive PPD     Treated with INH  . Nephrolithiasis 12/31/2010  . Substance abuse     Cocaine, marijuana, alcohol, stopped 10 yrs ago    Past Surgical History  Procedure Laterality Date  . Percutaneous liver biopsy  2006    No Known Allergies  Current Outpatient Prescriptions on File Prior to Visit  Medication Sig Dispense Refill  . amLODipine (NORVASC) 5 MG tablet Take 5 mg by mouth every morning.       Marland Kitchen aspirin 81 MG chewable tablet Chew 81 mg by mouth every morning.      . Cholecalciferol (VITAMIN D) 2000 UNITS tablet Take 2,000 Units by mouth daily.      . citalopram (CELEXA) 40 MG tablet Take 40 mg by mouth every morning.       Marland Kitchen glucagon (GLUCAGON EMERGENCY) 1 MG injection Inject 0.5 mg into the skin once as needed.       . insulin detemir (LEVEMIR) 100 UNIT/ML injection Inject 41 Units into the skin at bedtime.       . insulin lispro (HUMALOG) 100 UNIT/ML injection Inject 5 Units into the skin 3 (three) times daily before meals.      Marland Kitchen lisinopril-hydrochlorothiazide (PRINZIDE,ZESTORETIC) 20-12.5 MG per tablet Take 2 tablets by mouth every morning. Take 2 tabs daily      . metFORMIN (GLUCOPHAGE) 1000 MG tablet Take 1,000 mg by mouth 2 (two) times daily with a meal.       . Multiple Vitamin (MULTIVITAMIN) tablet Take 1 tablet by mouth daily.      . nadolol (CORGARD) 80 MG tablet Take 80 mg by mouth every morning.       . Omega-3 Fatty Acids (FISH OIL) 1000 MG CAPS Take 1,000 mg by mouth daily.      Marland Kitchen omeprazole (PRILOSEC) 20 MG capsule Take 20 mg by mouth daily.       . simvastatin (ZOCOR) 10 MG tablet Take 10 mg by mouth at bedtime.       . traZODone (DESYREL) 150 MG tablet Take 75 mg by mouth at bedtime.        No current facility-administered medications on file prior to visit.        Objective:   Physical Exam  Filed Vitals:   08/28/13 1114  BP: 94/50   Pulse: 60  Temp: 98.5 F (36.9 C)  Height: 5' 4"  (1.626 m)  Weight: 145 lb 11.2 oz (66.089 kg)   Alert and oriented. Skin warm and dry. Oral mucosa is moist.   . Sclera anicteric, conjunctivae is pink. Thyroid not enlarged. No cervical  lymphadenopathy. Lungs clear. Heart regular rate and rhythm.  Abdomen is soft. Bowel sounds are positive. No hepatomegaly. No abdominal masses felt. No tenderness.  No edema to lower extremities.       Assessment:    Hepatic lesions. US biopsy attempted in February. Lesions will need to be monitored.     #2. Cirrhosis secondary to chronic hepatitis C successfully treated in 2007.   Marland Kitchen     Plan:    AFP, CBC, MRI abdomen in July for Hepatic lesions. I have sent a telephone message to Lelon Frohlich to put on a recall.

## 2013-08-28 NOTE — Telephone Encounter (Signed)
done

## 2013-08-28 NOTE — Telephone Encounter (Signed)
Please put on recall for an MRI abdomen in July for Hepatic lesions.

## 2013-08-28 NOTE — Patient Instructions (Signed)
OV in 3 months with Dr. Rehman 

## 2013-08-29 ENCOUNTER — Other Ambulatory Visit (INDEPENDENT_AMBULATORY_CARE_PROVIDER_SITE_OTHER): Payer: Self-pay | Admitting: Internal Medicine

## 2013-08-29 ENCOUNTER — Telehealth (INDEPENDENT_AMBULATORY_CARE_PROVIDER_SITE_OTHER): Payer: Self-pay | Admitting: Internal Medicine

## 2013-08-29 DIAGNOSIS — K769 Liver disease, unspecified: Secondary | ICD-10-CM

## 2013-08-29 DIAGNOSIS — B1920 Unspecified viral hepatitis C without hepatic coma: Secondary | ICD-10-CM

## 2013-08-29 LAB — AFP TUMOR MARKER: AFP-Tumor Marker: 298.2 ng/mL — ABNORMAL HIGH (ref 0.0–8.0)

## 2013-08-29 NOTE — Telephone Encounter (Signed)
I discussed with Dr. Rehman. 

## 2013-08-29 NOTE — Telephone Encounter (Signed)
error 

## 2013-08-29 NOTE — Telephone Encounter (Signed)
Ok but what do I need to do

## 2013-10-08 ENCOUNTER — Telehealth (INDEPENDENT_AMBULATORY_CARE_PROVIDER_SITE_OTHER): Payer: Self-pay | Admitting: Internal Medicine

## 2013-10-08 ENCOUNTER — Telehealth (INDEPENDENT_AMBULATORY_CARE_PROVIDER_SITE_OTHER): Payer: Self-pay | Admitting: *Deleted

## 2013-10-08 DIAGNOSIS — B1920 Unspecified viral hepatitis C without hepatic coma: Secondary | ICD-10-CM

## 2013-10-08 DIAGNOSIS — R772 Abnormality of alphafetoprotein: Secondary | ICD-10-CM

## 2013-10-08 DIAGNOSIS — K769 Liver disease, unspecified: Secondary | ICD-10-CM

## 2013-10-08 DIAGNOSIS — K7460 Unspecified cirrhosis of liver: Secondary | ICD-10-CM

## 2013-10-08 NOTE — Telephone Encounter (Signed)
Per Dr.Rehman the patient will need to have labs drawn. Patient is to have MRI on 10-12-13. Will have patient to have lab work on that day also.

## 2013-10-08 NOTE — Telephone Encounter (Signed)
I spoke with Nicholas Becker at [facility] family care.

## 2013-10-08 NOTE — Telephone Encounter (Signed)
MRI sch'd 10/12/13, Pamala Hurry @ [facility] is aware

## 2013-10-12 ENCOUNTER — Ambulatory Visit (HOSPITAL_COMMUNITY)
Admission: RE | Admit: 2013-10-12 | Discharge: 2013-10-12 | Disposition: A | Discharge status: Home / Self Care | Payer: Medicare Other | Source: Ambulatory Visit | Attending: Internal Medicine | Admitting: Internal Medicine

## 2013-10-12 ENCOUNTER — Encounter (HOSPITAL_COMMUNITY): Payer: Self-pay

## 2013-10-12 DIAGNOSIS — K7689 Other specified diseases of liver: Secondary | ICD-10-CM | POA: Insufficient documentation

## 2013-10-12 DIAGNOSIS — B1920 Unspecified viral hepatitis C without hepatic coma: Secondary | ICD-10-CM

## 2013-10-12 DIAGNOSIS — Z09 Encounter for follow-up examination after completed treatment for conditions other than malignant neoplasm: Secondary | ICD-10-CM | POA: Insufficient documentation

## 2013-10-12 DIAGNOSIS — R772 Abnormality of alphafetoprotein: Secondary | ICD-10-CM

## 2013-10-12 MED ORDER — SODIUM CHLORIDE 0.9 % IV SOLN
INTRAVENOUS | Status: AC
  Filled 2013-10-12: qty 150

## 2013-10-12 MED ORDER — GADOXETATE DISODIUM 0.25 MMOL/ML IV SOLN
7.0000 mL | Freq: Once | INTRAVENOUS | Status: AC | PRN
  Administered 2013-10-12: 7 mL via INTRAVENOUS

## 2013-10-13 LAB — AFP TUMOR MARKER: AFP-Tumor Marker: 619.4 ng/mL — ABNORMAL HIGH (ref 0.0–8.0)

## 2013-10-18 ENCOUNTER — Telehealth (INDEPENDENT_AMBULATORY_CARE_PROVIDER_SITE_OTHER): Payer: Self-pay

## 2013-10-18 NOTE — Telephone Encounter (Signed)
Requesting pathology results on patient with [facility].

## 2013-10-22 ENCOUNTER — Ambulatory Visit (INDEPENDENT_AMBULATORY_CARE_PROVIDER_SITE_OTHER): Payer: Medicare Other | Admitting: General Surgery

## 2013-10-22 ENCOUNTER — Encounter (INDEPENDENT_AMBULATORY_CARE_PROVIDER_SITE_OTHER): Payer: Self-pay | Admitting: General Surgery

## 2013-10-22 VITALS — BP 130/72 | HR 53 | Temp 98.0°F | Ht 64.0 in | Wt 142.0 lb

## 2013-10-22 DIAGNOSIS — C228 Malignant neoplasm of liver, primary, unspecified as to type: Secondary | ICD-10-CM

## 2013-10-22 DIAGNOSIS — C220 Liver cell carcinoma: Secondary | ICD-10-CM

## 2013-10-22 NOTE — Patient Instructions (Addendum)
We will refer you to [facility] Liver transplant program.    Liver Cancer Cells divide to form new cells when the body needs them. That happens in the liver, just as it does in other organs. Sometimes, the cells divide too rapidly. Old cells do not die off, and the process gets out of control. A growth (tumor) forms. That is how liver cancer starts. The liver is an important organ of the body. It is located on the upper right side of the belly (abdomen), just below the ribs. It is the largest organ in the body. In an adult man, it is about the size of a football. The liver stores sugar and iron. It also cleans (filters) harmful substances out of the blood. CAUSES  Scientists do not know exactly why cells start to divide rapidly in the liver to form a tumor. It is known that certain behaviors and conditions (risk factors) make liver cancer more likely to develop. They include:  Being male. Men older than 50 have liver cancer more often than other people do.  Having scarring of the liver (cirrhosis).  This occurs when liver cells are damaged and replaced with scar tissue.  Heavy drinking of alcohol for many years can cause cirrhosis, as well as being infected with the hepatitis B or hepatitis C virus (HBV or HCV). HBV and HCV are spread through blood and sexual contact.  Having certain liver diseases, such as:  Hemochromatosis. This disease causes too much iron to be stored in the liver.  Autoimmune hepatitis. In this disease, the body's immune system turns against the liver.  Wilson's disease. This disease happens when copper builds up in the liver.  Having diabetes, particularly if you also drink alcohol heavily or have chronic viral hepatitis.  Being overweight (obese).  Exposure to alfatoxins. These are substances made by certain types of mold. They can form on peanuts, corn, wheat, soybeans, and rice. This is not a common cause of liver cancer in the [place] where foods are  tested for alfatoxins. SYMPTOMS  Often times, liver cancer often has few symptoms in the beginning. Sometimes, there are none. As the cancer grows, symptoms may include:  Weight loss without dieting.  Loss of appetite.  Nausea.  Feeling very weak and tired.  Pain on the right side of the belly (abdomen).  Feeling full or bloated.  Running a fever for no reason.  The skin or eyes become yellow in color (jaundice).  Dark-colored urine. DIAGNOSIS  Your caregiver may suspect that you have liver cancer based on your symptoms and your physical exam. Other tests will likely be necessary. These may include:  Blood tests, including a test called alpha-fetoprotein (AFP). The blood contains more of this protein if someone has liver cancer.  Imaging tests. These tests may be able to detect cancer or a tumor. These tests include CT scan, MRI scan, or ultrasound.  Liver biopsy. Using medicine to numb your skin, a specialist can insert a needle into the liver and remove some cells. These cells are examined under a microscope to determine if cancer is present. This is the best way to be certain of the diagnosis. TREATMENT  Liver cancer can be treated many ways. The type of treatment will depend on:  The stage of the cancer.  Your age and overall health.  The condition of the liver. This means how well it is working and whether cirrhosis is present. Treatments can be used alone or in combination. Options  include:  Surgery.  The part of the liver that has cancer may be removed.  The whole liver may be removed. It would be replaced with a healthy liver (liver transplant).  Radiation. High-energy X-rays kill the cancer cells.  External radiation beams rays from a machine outside the body.  Internal radiation uses tiny spheres (like beads) that are put into the body. They give off radiation.  Chemotherapy. This treatment uses drugs that kill cancer cells. Options include:  Intravenous  chemotherapy. Drugs are put into the blood to travel through the body.  Targeted chemotherapy. This uses a drug that kills liver cancer by blocking the cancer's blood supply.  Chemoembolization. Drugs are injected directly into the liver to kill cancer cells.  Cryoablation. This involves killing the cancer cells by freezing them.  Radiofrequency ablation. This procedure kills the cancer cells by heating them with an electric current. HOME CARE INSTRUCTIONS   Learn as much as you can about liver cancer. It is important to be an informed patient.  Work closely with your caregivers. Fighting cancer takes a team approach.  Take medicines for pain only as prescribed by your caregiver. Follow the directions carefully. Ask before taking any over-the-counter drugs. Be aware that medicines containing acetaminophen can add to liver damage.  Pay attention to what you eat. Nutrition is an important part of recovering from liver cancer. Having this cancer can take away your appetite. So can some of the treatments for it. You might need to limit salt. It is also important to get the right amount of protein. Ask your caregiver for advice or talk with a nutritionist.  Consider joining a support group. Learning to live with a serious health problem, such as liver cancer, can be difficult. Friends and family can help. Many people also find it helpful to talk with others who are going through the same things you are. Ask your caregiver for a list of groups in your area.  Get rest.  Do not drink alcoholic beverages at all.  Get vaccinated against hepatitis A and hepatitis B. There is no vaccine for hepatitis C. If you are considered at risk for these diseases, get tested. SEEK MEDICAL CARE IF:   You cannot eat because you feel sick to your stomach.  Your abdomen or legs start to swell. Fluid might be building up.  You feel weaker or more tired than usual.  Your pain gets worse. SEEK IMMEDIATE MEDICAL  CARE IF:   Your pain in your abdomen increases suddenly.  You have nausea, vomiting, or diarrhea that does not go away.  You feel confused.  You feel very sleepy during the daytime.  You have any bleeding that does not stop quickly.  You have a fever. Document Released: 08/29/2008 Document Revised: 07/05/2011 Document Reviewed: 08/29/2008 [facility] Patient Information 2015 [place], [place]. This information is not intended to replace advice given to you by your health care provider. Make sure you discuss any questions you have with your health care provider.

## 2013-10-23 ENCOUNTER — Ambulatory Visit (INDEPENDENT_AMBULATORY_CARE_PROVIDER_SITE_OTHER): Payer: Medicare Other | Admitting: Internal Medicine

## 2013-10-23 ENCOUNTER — Encounter (INDEPENDENT_AMBULATORY_CARE_PROVIDER_SITE_OTHER): Payer: Self-pay | Admitting: Internal Medicine

## 2013-10-23 VITALS — BP 116/72 | HR 78 | Temp 97.4°F | Resp 18 | Ht 64.0 in | Wt 143.6 lb

## 2013-10-23 DIAGNOSIS — C220 Liver cell carcinoma: Secondary | ICD-10-CM

## 2013-10-23 DIAGNOSIS — K7469 Other cirrhosis of liver: Secondary | ICD-10-CM

## 2013-10-23 DIAGNOSIS — C228 Malignant neoplasm of liver, primary, unspecified as to type: Secondary | ICD-10-CM

## 2013-10-23 DIAGNOSIS — Z8619 Personal history of other infectious and parasitic diseases: Secondary | ICD-10-CM

## 2013-10-23 DIAGNOSIS — K7460 Unspecified cirrhosis of liver: Secondary | ICD-10-CM

## 2013-10-23 NOTE — Patient Instructions (Signed)
Please stop by the office next week to pick up imaging studies for [facility] visit.

## 2013-10-23 NOTE — Progress Notes (Signed)
Presenting complaint;  Follow-up for liver problems.  Database;  Patient is 58 year old African American male who was history of cirrhosis secondary to hepatitis C which was successfully treated with 48 weeks of Pegasys and ribavirin. He finished treatment in November 2007 and has remained in SVR. He was noted to have gradual rise in alpha-fetoprotein. He was initially noted in June 2006 right to initiating antiviral therapy. MRI was negative for liver lesions. Is been getting periodic screening with ultrasound CT and/or MRI. MRI of December 2012 did not reveal any suspicious lesions. Patient's MRI was 27.6. It increased to 35.7 on 03/28/2012. Aphthae increased to 99.1 on 02/20/2013. He had hepatic protocol MRI on 02/18/2013 this time there was subtle 12 x 9 mm lesion with washout in medial aspect of left hepatic lobe. No is no arterial enhancement. This lesion was felt to be dysplastic nodule. Patient had another MRI on 05/23/2013 this time he was noted to have 2 foci of abnormal signal. Lesion noted on prior study of October 2014 has slightly increased. These are still felt to be dysplastic nodules. Ultrasound was obtained in order to biopsy the larger lesion but these lesions were not seen on this study. Meanwhile patient's AFP increased to 542.8 on 10/03/2013 He had another MR on 10/12/2013 revealing 24 mm lesion in left segment of left hepatic lobe previously measuring 13 mm. He had another 13 mm lesion in right hepatic lobe. He was noted to have cholelithiasis and cirrhotic changes as on previous studies. Since alpha-fetoprotein jumped to 619.4 in 10/12/2013. I discussed the patient's case with Dr. Stark Klein who agreed to see him in consultation next he did yesterday even before I had a chance to talk with patient. His appointment today it was actually to discuss diagnosis and treatment options.  Subjective;  Patient is accompanied by Margaretha Sheffield who works at group home where patient  resides. He wants to know if he really has liver cancer. He is somewhat confused. He has no complaints. He has good appetite. He denies abdominal pain nausea vomiting melena or rectal bleeding. He is not having any problems with fluid retention. He has lost 7 pounds since his last visit about 6 months ago.    Current Medications: Outpatient Encounter Prescriptions as of 10/23/2013  Medication Sig  . amLODipine (NORVASC) 5 MG tablet Take 5 mg by mouth every morning.   Marland Kitchen aspirin 81 MG chewable tablet Chew 81 mg by mouth every morning.  . Cholecalciferol (VITAMIN D) 2000 UNITS tablet Take 2,000 Units by mouth daily.  . citalopram (CELEXA) 40 MG tablet Take 40 mg by mouth every morning.   Marland Kitchen glucagon (GLUCAGON EMERGENCY) 1 MG injection Inject 0.5 mg into the skin once as needed.   . insulin detemir (LEVEMIR) 100 UNIT/ML injection Inject 41 Units into the skin daily.   . insulin lispro (HUMALOG) 100 UNIT/ML injection Inject 5 Units into the skin 3 (three) times daily before meals.  Marland Kitchen lisinopril-hydrochlorothiazide (PRINZIDE,ZESTORETIC) 20-12.5 MG per tablet Take 2 tablets by mouth every morning. Take 2 tabs daily  . metFORMIN (GLUCOPHAGE) 1000 MG tablet Take 1,000 mg by mouth 2 (two) times daily with a meal.   . Multiple Vitamin (MULTIVITAMIN) tablet Take 1 tablet by mouth daily.  . nadolol (CORGARD) 80 MG tablet Take 80 mg by mouth every morning.   . Omega-3 Fatty Acids (FISH OIL) 1000 MG CAPS Take 1,000 mg by mouth daily.  Marland Kitchen omeprazole (PRILOSEC) 20 MG capsule Take 20 mg by mouth daily.   Marland Kitchen  simvastatin (ZOCOR) 10 MG tablet Take 10 mg by mouth at bedtime.   . traZODone (DESYREL) 150 MG tablet Take 75 mg by mouth at bedtime.      Objective: Blood pressure 116/72, pulse 78, temperature 97.4 F (36.3 C), temperature source Oral, resp. rate 18, height 5\' 4"  (1.626 m), weight 143 lb 9.6 oz (65.137 kg). Patient is alert and in does not have asterixis. Conjunctiva is pink. Sclera is  nonicteric Oropharyngeal mucosa is normal. No neck masses or thyromegaly noted. Cardiac exam with regular rhythm normal S1 and S2. No murmur or gallop noted. Lungs are clear to auscultation. Abdomen symmetrical and soft. Liver edge is firm but nontender. Spleen is nonpalpable.  No LE edema or clubbing noted.  Labs/studies Results:   lab data under database;  Assessment:  #1.Patient has developed two lesions in liver and progressive rise in AFP which is good enough to make diagnosis without biopsy. Dr. Barry Dienes has decided to refer him to [facility]. Since patient has at least 2 lesions one in each lobe she felt he is not candidate for surgical intervention. Patient informed that he has [place] given though we don't have tissue diagnosis. He has been documented to have progressively enlarging liver lesions an alpha-fetoprotein is increased several fold. I believe his prognosis is excellent. These lesions can be ablated with RFA or other modalities. HCVRNA was undetectable in November 2012. #2. Asymptomatic cholelithiasis.    Plan:  Patient will stop by to pick imaging studies next week that he can carry to [facility]. Will plan to see him once evaluation at [facility] completed.

## 2013-10-23 NOTE — Progress Notes (Signed)
Chief Complaint  Patient presents with  . eval liver ca    HISTORY: Pt is a 58 yo M who presents with a long history of cirrhosis due to hepatitis C, and increasing size of liver nodules in association with rising AFP.  He had been followed with ultrasound until AFP increased to 200.  He then underwent MRI.  He was felt to have dysplastic nodules.  This May, they were finally seen to be enlarging and AFP increased to 619.4.  He is referred to consider resection.  He denies abdominal pain, ascites, GI bleeds, varices of abdominal wall.  He is not having diarrhea or jaundice.  He has had a small amount of unexpected weight loss.    Past Medical History  Diagnosis Date  . Diabetes mellitus   . Hypertension     normal Bmet in 2012  . Hyperlipemia   . Chronic pancreatitis     calcified  . Tobacco abuse     1/2 pack per day  . Chronic hepatitis C with cirrhosis 2006    Secondary to hepatitis C and ethanol abuse, which is now in remission; varices-status post banding at [facility]; normal LFTs in 2009; thrombocytopenia In the past, platelets-153,000 in 2012  . Cholelithiasis 2009  . Bronchiectasis 2009    Left lower lobe; normal chest x-ray in 2012  . Cataracts, bilateral   . Overweight(278.02)   . Positive PPD     Treated with INH  . Nephrolithiasis 12/31/2010  . Substance abuse     Cocaine, marijuana, alcohol, stopped 10 yrs ago    Past Surgical History  Procedure Laterality Date  . Percutaneous liver biopsy  2006    Current Outpatient Prescriptions  Medication Sig Dispense Refill  . amLODipine (NORVASC) 5 MG tablet Take 5 mg by mouth every morning.       Marland Kitchen aspirin 81 MG chewable tablet Chew 81 mg by mouth every morning.      . Cholecalciferol (VITAMIN D) 2000 UNITS tablet Take 2,000 Units by mouth daily.      . citalopram (CELEXA) 40 MG tablet Take 40 mg by mouth every morning.       Marland Kitchen glucagon (GLUCAGON EMERGENCY) 1 MG injection Inject 0.5 mg into the skin once as needed.       .  insulin detemir (LEVEMIR) 100 UNIT/ML injection Inject 41 Units into the skin at bedtime.       . insulin lispro (HUMALOG) 100 UNIT/ML injection Inject 5 Units into the skin 3 (three) times daily before meals.      Marland Kitchen lisinopril-hydrochlorothiazide (PRINZIDE,ZESTORETIC) 20-12.5 MG per tablet Take 2 tablets by mouth every morning. Take 2 tabs daily      . metFORMIN (GLUCOPHAGE) 1000 MG tablet Take 1,000 mg by mouth 2 (two) times daily with a meal.       . Multiple Vitamin (MULTIVITAMIN) tablet Take 1 tablet by mouth daily.      . nadolol (CORGARD) 80 MG tablet Take 80 mg by mouth every morning.       . Omega-3 Fatty Acids (FISH OIL) 1000 MG CAPS Take 1,000 mg by mouth daily.      Marland Kitchen omeprazole (PRILOSEC) 20 MG capsule Take 20 mg by mouth daily.       . simvastatin (ZOCOR) 10 MG tablet Take 10 mg by mouth at bedtime.       . traZODone (DESYREL) 150 MG tablet Take 75 mg by mouth at bedtime.        No  current facility-administered medications for this visit.     No Known Allergies   Family History  Problem Relation Age of Onset  . Healthy Sister   . Lung cancer Brother   . Healthy Sister   . Healthy Sister   . Healthy Sister   . Healthy Brother   . Healthy Brother   . Diabetes Daughter   . Healthy Son      History   Social History  . Marital Status: Divorced    Spouse Name: N/A    Number of Children: N/A  . Years of Education: N/A   Social History Main Topics  . Smoking status: Current Every Day Smoker    Types: Cigarettes  . Smokeless tobacco: Never Used     Comment: Patient smokes 1/2 pack a day  . Alcohol Use: No  . Drug Use: No  . Sexual Activity: None   Other Topics Concern  . None   Social History Narrative  . None     REVIEW OF SYSTEMS - PERTINENT POSITIVES ONLY: 12 point review of systems negative other than HPI and PMH  EXAM: Filed Vitals:   10/22/13 1518  BP: 130/72  Pulse: 53  Temp: 98 F (36.7 C)    Wt Readings from Last 3 Encounters:   10/22/13 142 lb (64.411 kg)  08/28/13 145 lb 11.2 oz (66.089 kg)  06/25/13 140 lb (63.504 kg)     Gen:  No acute distress.  Well nourished and well groomed.   Neurological: Alert and oriented to person, place, and time. Coordination normal.  Head: Normocephalic and atraumatic.  Eyes: Conjunctivae are normal. Pupils are equal, round, and reactive to light. No scleral icterus.  Neck: Normal range of motion. Neck supple. No tracheal deviation or thyromegaly present.  Cardiovascular: Normal rate, regular rhythm, normal heart sounds and intact distal pulses.  Exam reveals no gallop and no friction rub.  No murmur heard. Respiratory: Effort normal.  No respiratory distress. No chest wall tenderness. Breath sounds normal.  No wheezes, rales or rhonchi.  GI: Soft. Bowel sounds are normal. The abdomen is soft and nontender.  There is no rebound and no guarding. no scars seen. Musculoskeletal: Normal range of motion. Extremities are nontender.  Lymphadenopathy: No cervical, preauricular, postauricular or axillary adenopathy is present Skin: Skin is warm and dry. No rash noted. No diaphoresis. No erythema. No pallor. No clubbing, cyanosis, or edema.   Psychiatric: Normal mood and affect. Behavior is normal. Judgment and thought content normal.    LABORATORY RESULTS: Available labs are reviewed   Recent Results (from the past 2160 hour(s))  AFP TUMOR MARKER     Status: Abnormal   Collection Time    08/28/13 11:50 AM      Result Value Ref Range   AFP-Tumor Marker 298.2 (*) 0.0 - 8.0 ng/mL   Comment:       The [employer] Centaur AFP immunoassay method is used.  Results obtained     with different assay methods or kits cannot be used interchangeably.     AFP is a valuable aid in the management of nonseminomatous testicular     cancer patients when used in conjunction with information available     from the clinical evaluation and other diagnostic procedures.     Increased serum AFP concentrations  have also been observed in ataxia     telangiectasia, hereditary tyrosinemia, primary hepatocellular     carcinoma, teratocarcinoma, gastrointestinal tract cancers with and     without liver  metastases, and in benign hepatic conditions such as     acute viral hepatitis, chronic active hepatitis, and cirrhosis.  This     result cannot be interpreted as absolute evidence of the presence or     absence of malignant disease.  This result is not interpretable in     pregnant females.  COMPREHENSIVE METABOLIC PANEL     Status: Abnormal   Collection Time    08/28/13 11:50 AM      Result Value Ref Range   Sodium 140  135 - 145 mEq/L   Potassium 4.2  3.5 - 5.3 mEq/L   Chloride 100  96 - 112 mEq/L   CO2 28  19 - 32 mEq/L   Glucose, Bld 145 (*) 70 - 99 mg/dL   BUN 9  6 - 23 mg/dL   Creat 0.90  0.50 - 1.35 mg/dL   Total Bilirubin 0.4  0.2 - 1.2 mg/dL   Alkaline Phosphatase 62  39 - 117 U/L   AST 19  0 - 37 U/L   ALT 11  0 - 53 U/L   Total Protein 6.5  6.0 - 8.3 g/dL   Albumin 4.1  3.5 - 5.2 g/dL   Calcium 9.3  8.4 - 10.5 mg/dL  AFP TUMOR MARKER     Status: Abnormal   Collection Time    10/12/13 12:01 AM      Result Value Ref Range   AFP-Tumor Marker 619.4 (*) 0.0 - 8.0 ng/mL   Comment:       The Advia Centaur AFP immunoassay method is used.  Results obtained     with different assay methods or kits cannot be used interchangeably.     AFP is a valuable aid in the management of nonseminomatous testicular     cancer patients when used in conjunction with information available     from the clinical evaluation and other diagnostic procedures.     Increased serum AFP concentrations have also been observed in ataxia     telangiectasia, hereditary tyrosinemia, primary hepatocellular     carcinoma, teratocarcinoma, gastrointestinal tract cancers with and     without liver metastases, and in benign hepatic conditions such as     acute viral hepatitis, chronic active hepatitis, and cirrhosis.  This      result cannot be interpreted as absolute evidence of the presence or     absence of malignant disease.  This result is not interpretable in     pregnant females.     RADIOLOGY RESULTS: See E-Chart or I-Site for most recent results.  Images and reports are reviewed.  Mr Abdomen W Wo Contrast  10/12/2013   CLINICAL DATA:  Followup liver lesions  EXAM: MRI ABDOMEN WITHOUT AND WITH CONTRAST  TECHNIQUE: Multiplanar multisequence MR imaging of the abdomen was performed both before and after the administration of intravenous contrast.  CONTRAST:  7 cc of Eovist  COMPARISON:  05/23/2013.  FINDINGS: No pleural effusions identified.  Mild changes of cirrhosis. Multifocal liver lesions are identified. The largest lesion is in the lateral segment of left hepatic lobe this measures 2.4 cm, 18/series 17. Previously this measured 1.3 cm. Index lesion within the right hepatic lobe measures 1.3 cm, image 41/ series 5003. Previously this measured 0.9 cm. Multiple stones are identified within the gallbladder. No biliary dilatation. Normal appearance of the pancreas. The spleen is normal in size.  The adrenal glands are both normal. Normal appearance of both kidneys.  The abdominal aorta has  a normal caliber. No aneurysm. There is no upper abdominal adenopathy identified. No free fluid or fluid collections identified.  Signal from within the bone marrow is unremarkable.  IMPRESSION: 1. Multi focal liver lesions appear increased in size from previous exam. 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   Electronically Signed   By: Kerby Moors M.D.   On: 10/12/2013 11:10      ASSESSMENT AND PLAN: HCC (hepatocellular carcinoma) Pt has 2 dominant lesions less than 3 cm.  It is unclear if the other tiny hypervascular foci represent true Elliott lesions or not.  We will make referral to see if he is candidate for  transplant.  If he is, he would benefit from being treated at that hospital.  He prefers a [facility] referral.    He may not be candidate, in which case we will discuss surgery vs ablation of both lesions.  He could potentially have slighly extended left lobectomy, but this extensive of a resection is difficult with cirrhosis.  He also may truly have multifocal bilobar disease.    I am concerned about his social situation.  He stays in assisted living so he can receive help with meals and medications.  I do not know if this factor would eliminate him from being listed.  I will follow him up if he is not going to be treated at [facility]        Milus Height MD Surgical Oncology, General and [facility]      Visit Diagnoses: 1. [place] (hepatocellular carcinoma)     Primary Care Physician: Bronson Curb, PA-C

## 2013-10-23 NOTE — Assessment & Plan Note (Addendum)
Pt has 2 dominant lesions less than 3 cm.  It is unclear if the other tiny hypervascular foci represent true [place] lesions or not.  We will make referral to see if he is candidate for transplant.  If he is, he would benefit from being treated at that hospital.  He prefers a [facility] referral.    He may not be candidate, in which case we will discuss surgery vs ablation of both lesions.  He could potentially have slighly extended left lobectomy, but this extensive of a resection is difficult with cirrhosis.  He also may truly have multifocal bilobar disease.    I am concerned about his social situation.  He stays in assisted living so he can receive help with meals and medications.  I do not know if this factor would eliminate him from being listed.  I will follow him up if he is not going to be treated at [facility].

## 2013-10-30 ENCOUNTER — Telehealth (INDEPENDENT_AMBULATORY_CARE_PROVIDER_SITE_OTHER): Payer: Self-pay | Admitting: *Deleted

## 2013-10-30 NOTE — Telephone Encounter (Signed)
He does not need another MRI

## 2013-10-30 NOTE — Telephone Encounter (Signed)
Noted, will take him off recall list

## 2013-10-30 NOTE — Telephone Encounter (Signed)
Patient is on my recall for f/u MRI for liver lesion, does he still need since it looks like he just had MRI in June, please advise

## 2013-11-14 ENCOUNTER — Telehealth (INDEPENDENT_AMBULATORY_CARE_PROVIDER_SITE_OTHER): Payer: Self-pay

## 2013-11-14 NOTE — Telephone Encounter (Signed)
Verlene Becker is calling for pt. She states that when pt was in to see Dr Barry Dienes, she had referred pt to go to [facility] for a possible liver transplant. Nicholas Becker states that they went to see Dr Laural Golden and Dr Olevia Perches office explained to them that pt would not be a candidate for sx. They were calling to see if/when Dr Barry Dienes would want to see pt in the office to discuss further options. Informed pt that I would send Dr Barry Dienes a message and we would contact them as soon as we received a response. She verbalized understanding and agrees with POC.

## 2013-11-15 ENCOUNTER — Other Ambulatory Visit (INDEPENDENT_AMBULATORY_CARE_PROVIDER_SITE_OTHER): Payer: Self-pay | Admitting: General Surgery

## 2013-11-15 DIAGNOSIS — C220 Liver cell carcinoma: Secondary | ICD-10-CM

## 2013-11-15 NOTE — Telephone Encounter (Signed)
Please refer to interventional radiology for possible microwave ablation.

## 2013-11-20 ENCOUNTER — Ambulatory Visit
Admission: RE | Admit: 2013-11-20 | Discharge: 2013-11-20 | Disposition: A | Discharge status: Home / Self Care | Payer: Medicare Other | Source: Ambulatory Visit | Attending: General Surgery | Admitting: General Surgery

## 2013-11-20 DIAGNOSIS — C220 Liver cell carcinoma: Secondary | ICD-10-CM

## 2013-11-20 NOTE — Consult Note (Addendum)
Reason for Consult:Multifocal hepatoma Referring Physician: Tamarick Kovalcik is an 58 y.o. male.  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 He finished treatment in November 2007 and has remained in SVR.  He was noted to have gradual rise in alpha-fetoprotein. He was initially noted in June 2006 right to initiating antiviral therapy. MRI was negative for liver lesions.  Is been getting periodic screening with ultrasound CT and/or MRI. MRI of December 2012 did not reveal any suspicious lesions.   He had hepatic protocol MRI on 02/18/2013 this time there was subtle 12 x 9 mm lesion with washout in medial aspect of left hepatic lobe. No arterial enhancement. This lesion was felt to be dysplastic nodule.  Patient had another MRI on 05/23/2013 this time he was noted to have 2 foci of abnormal signal. Lesion noted on prior study of October 2014 has slightly increased. These are still felt to be dysplastic nodules.  Ultrasound was obtained in order to biopsy the larger lesion but these lesions were not seen on this study.  Meanwhile patient's AFP increased to 542.8 on 10/03/2013  He had another MR on 10/12/2013 revealing 24 mm lesion in left segment of left hepatic lobe previously measuring 13 mm.  He had another 13 mm lesion in right hepatic lobe.  Since alpha-fetoprotein jumped to 619.4 in 10/12/2013. and increasing size of liver nodules in association with rising AFP. He denies abdominal pain, ascites, GI bleeds, varices of abdominal wall. He is not having diarrhea or jaundice. He has had a small amount of unexpected weight loss.  Per [facility] liver transplant eval: "Patient lives permanently in an assisted living center and has no family who could provide assistance for transplant. His referral has been closed."   General surgery consulted regarding resection of lesions, and was then referred for  percutaneous treatment options.    Past Medical History  Diagnosis Date  . Diabetes mellitus   . Hypertension     normal Bmet in 2012  . Hyperlipemia   . Chronic pancreatitis     calcified  . Tobacco abuse     1/2 pack per day  . Chronic hepatitis C with cirrhosis 2006    Secondary to hepatitis C and ethanol abuse, which is now in remission; varices-status post banding at [facility]; normal LFTs in 2009; thrombocytopenia In the past, platelets-153,000 in 2012  . Cholelithiasis 2009  . Bronchiectasis 2009    Left lower lobe; normal chest x-ray in 2012  . Cataracts, bilateral   . Overweight(278.02)   . Positive PPD     Treated with INH  . Nephrolithiasis 12/31/2010  . Substance abuse     Cocaine, marijuana, alcohol, stopped 10 yrs ago    Past Surgical History  Procedure Laterality Date  . Percutaneous liver biopsy  2006    Family History  Problem Relation Age of Onset  . Healthy Sister   . Lung cancer Brother   . Healthy Sister   . Healthy Sister   . Healthy Sister   . Healthy Brother   . Healthy Brother   . Diabetes Daughter   . Healthy Son     Social History:  reports that he has been smoking Cigarettes.  He has been smoking about 0.00 packs per day. He has never used smokeless tobacco. He reports that he does not drink alcohol or use illicit drugs.  Allergies: No Known Allergies  Medications:  Current Outpatient Prescriptions on File Prior to Encounter  Medication Sig Dispense Refill  . amLODipine (NORVASC) 5 MG tablet Take 5 mg by mouth every morning.       Marland Kitchen aspirin 81 MG chewable tablet Chew 81 mg by mouth every morning.      . Cholecalciferol (VITAMIN D) 2000 UNITS tablet Take 2,000 Units by mouth daily.      . citalopram (CELEXA) 40 MG tablet Take 40 mg by mouth every morning.       . insulin detemir (LEVEMIR) 100 UNIT/ML injection Inject 41 Units into the skin daily.       . insulin lispro (HUMALOG) 100 UNIT/ML injection Inject 5 Units into the skin 3 (three)  times daily before meals.      Marland Kitchen lisinopril-hydrochlorothiazide (PRINZIDE,ZESTORETIC) 20-12.5 MG per tablet Take 2 tablets by mouth every morning. Take 2 tabs daily      . metFORMIN (GLUCOPHAGE) 1000 MG tablet Take 1,000 mg by mouth 2 (two) times daily with a meal.       . Multiple Vitamin (MULTIVITAMIN) tablet Take 1 tablet by mouth daily.      . nadolol (CORGARD) 80 MG tablet Take 80 mg by mouth every morning.       . Omega-3 Fatty Acids (FISH OIL) 1000 MG CAPS Take 1,000 mg by mouth daily.      Marland Kitchen omeprazole (PRILOSEC) 20 MG capsule Take 20 mg by mouth daily.       . simvastatin (ZOCOR) 10 MG tablet Take 10 mg by mouth at bedtime.       . traZODone (DESYREL) 150 MG tablet Take 75 mg by mouth at bedtime.       Marland Kitchen glucagon (GLUCAGON EMERGENCY) 1 MG injection Inject 0.5 mg into the skin once as needed.        No current facility-administered medications on file prior to encounter.    Review of Systems  Constitutional: Negative for fever and chills.  Respiratory: Negative.   Gastrointestinal: Negative.   Genitourinary: Negative.   Musculoskeletal: Negative.   Skin: Negative.   Neurological: Negative.   Psychiatric/Behavioral: Positive for substance abuse.   Blood pressure 123/71, pulse 54, temperature 98.1 F (36.7 C), temperature source Oral, resp. rate 14, height 5' 4"  (1.626 m), weight 140 lb (63.504 kg), SpO2 99.00%. Physical Exam  Constitutional: He is oriented to person, place, and time. He appears well-developed and well-nourished. No distress.  HENT:  Head: Normocephalic.  Eyes: EOM are normal.  Neck: Normal range of motion. Thyromegaly present.  Respiratory: Effort normal. No stridor. No respiratory distress.  GI: Soft. He exhibits no distension.  Neurological: He is alert and oriented to person, place, and time.  Skin: Skin is warm and dry. He is not diaphoretic. No erythema.  Psychiatric: He has a normal mood and affect. His behavior is normal. Judgment and thought content  normal.   AFP-Tumor Marker  Date Value Ref Range Status  10/12/2013 619.4* 0.0 - 8.0 ng/mL Final           The Advia Centaur AFP immunoassay method is used.  Results obtained     with different assay methods or kits cannot be used interchangeably.     AFP is a valuable aid in the management of nonseminomatous testicular     cancer patients when used in conjunction with information available     from the clinical evaluation and other diagnostic procedures.     Increased serum AFP concentrations have also been observed in ataxia  telangiectasia, hereditary tyrosinemia, primary hepatocellular     carcinoma, teratocarcinoma, gastrointestinal tract cancers with and     without liver metastases, and in benign hepatic conditions such as     acute viral hepatitis, chronic active hepatitis, and cirrhosis.  This     result cannot be interpreted as absolute evidence of the presence or     absence of malignant disease.  This result is not interpretable in     pregnant females.   BMET    Component Value Date/Time   NA 140 08/28/2013 1150   K 4.2 08/28/2013 1150   CL 100 08/28/2013 1150   CO2 28 08/28/2013 1150   GLUCOSE 145* 08/28/2013 1150   BUN 9 08/28/2013 1150   CREATININE 0.90 08/28/2013 1150   CREATININE 1.10 02/16/2013 0826   CALCIUM 9.3 08/28/2013 1150   GFRNONAA >60 06/19/2010 2011   GFRAA  Value: >60        The eGFR has been calculated using the MDRD equation. This calculation has not been validated in all clinical situations. eGFR's persistently <60 mL/min signify possible Chronic Kidney Disease. 06/19/2010 2011    CBC    Component Value Date/Time   WBC 6.7 06/25/2013 1304   RBC 5.08 06/25/2013 1304   HGB 16.4 06/25/2013 1304   HCT 45.7 06/25/2013 1304   PLT 155 06/25/2013 1304   MCV 90.0 06/25/2013 1304   MCH 32.3 06/25/2013 1304   MCHC 35.9 06/25/2013 1304   RDW 13.0 06/25/2013 1304   LYMPHSABS 1.4 06/19/2010 2011   MONOABS 0.6 06/19/2010 2011   EOSABS 0.0 06/19/2010 2011   BASOSABS 0.0 06/19/2010 2011       Hepatic Function Latest Ref Rng 08/28/2013 02/17/2008  Total Protein 6.0 - 8.3 g/dL 6.5 6.3  Albumin 3.5 - 5.2 g/dL 4.1 3.7  AST 0 - 37 U/L 19 29  ALT 0 - 53 U/L 11 23  Alk Phosphatase 39 - 117 U/L 62 77  Total Bilirubin 0.2 - 1.2 mg/dL 0.4 0.7     He is accompanied by nurse from facility.  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 The 2 lesions are peripheral enough and small enough for percutaneous thermal ablation, preferred over transarterial bland-,  Chemo-, or radioablation to maximally preserve background liver function with high expectation of local control. We discussed pathophysiology of HCCA, treatment options, technical details of percutaneous thermal ablation under MAC, anticipated benefits, risks and possible complications, and need for follow up. He seemed to understand and was eager to proceed, after Aug 15 so he could go on a planned vacation. We can set this up for him as requested.   Deyja Sochacki III,DAYNE Devoiry Corriher 11/20/2013, 4:09 PM

## 2013-11-22 ENCOUNTER — Other Ambulatory Visit: Payer: Self-pay | Admitting: Interventional Radiology

## 2013-11-22 DIAGNOSIS — C220 Liver cell carcinoma: Secondary | ICD-10-CM

## 2013-12-03 ENCOUNTER — Telehealth (INDEPENDENT_AMBULATORY_CARE_PROVIDER_SITE_OTHER): Payer: Self-pay | Admitting: *Deleted

## 2013-12-03 NOTE — Telephone Encounter (Signed)
Cheryl with [facility] called and needs you to call back so she can give you some lab results   Message left Friday 1:19 pm

## 2013-12-03 NOTE — Telephone Encounter (Signed)
I spoke with Nicholas Becker , she states that on Friday when she rec'd the AFP result she felt that she needed to contact PCP or [place]. Our office was closed , so she called PCP. Patient's AFP result was 1137. They advised that he be taken to ED for evaluation. She called the Care Home and they stated that he had gone to [facility] leaving no contact information.  Per Dr.Rehman patient need to see Dr. Barry Dienes @ [place]. Patient should not go to ED.  Nicholas Becker was called and made aware.  Per Dr.Rehman if the patient does not already have an appointment at [facility], to call  us and if the patient agrees he will make an appointment at [facility]. Nicholas Becker was advised.

## 2013-12-06 NOTE — Telephone Encounter (Signed)
Call made to [place] with [facility] Department. Message was left asking of a follow-up/appointment had been made with Dr.Byerly. If not , and patient is in an agreement, Dr.Rehman will try and get an appointment at [facility] for the patient.

## 2013-12-21 ENCOUNTER — Other Ambulatory Visit: Payer: Self-pay | Admitting: Radiology

## 2013-12-25 ENCOUNTER — Ambulatory Visit (INDEPENDENT_AMBULATORY_CARE_PROVIDER_SITE_OTHER): Payer: Medicare Other | Admitting: Internal Medicine

## 2013-12-25 ENCOUNTER — Encounter (HOSPITAL_COMMUNITY): Payer: Self-pay | Admitting: Pharmacy Technician

## 2013-12-26 ENCOUNTER — Inpatient Hospital Stay (HOSPITAL_COMMUNITY): Admission: RE | Admit: 2013-12-26 | Payer: Medicare Other | Source: Ambulatory Visit

## 2013-12-27 ENCOUNTER — Other Ambulatory Visit (HOSPITAL_COMMUNITY): Payer: Self-pay | Admitting: Anesthesiology

## 2013-12-27 ENCOUNTER — Encounter (HOSPITAL_COMMUNITY): Payer: Self-pay | Admitting: Pharmacy Technician

## 2013-12-27 NOTE — Patient Instructions (Addendum)
[place]  12/27/2013   Your procedure is scheduled HC:WCBJSE September 11th, 2015  Report to [facility] Main Entrance and follow signs to  Radiology at 600 AM.  Call this number if you have problems the morning of surgery [phone]   Remember: [place].  Do not eat food or drink liquids :After Midnight.     Take these medicines the morning of surgery with A SIP OF WATER: OMEPRAZOLE, CITALOPRAM, NADOLOL, AMLODIPINE BESYLATE                               You may not have any metal on your body including hair pins and piercings  Do not wear jewelry, make-up, lotions, powders, or deodorant.   Men may shave face and neck.  Do not bring valuables to the hospital. [place].  Contacts, dentures or bridgework may not be worn into surgery.  Leave suitcase in the car. After surgery it may be brought to your room.  For patients admitted to the hospital, checkout time is 11:00 AM the day of discharge.   ________________________________________________________________________  [facility] - Preparing for Surgery Before surgery, you can play an important role.  Because skin is not sterile, your skin needs to be as free of germs as possible.  You can reduce the number of germs on your skin by washing with CHG (chlorahexidine gluconate) soap before surgery.  CHG is an antiseptic cleaner which kills germs and bonds with the skin to continue killing germs even after washing. Please DO NOT use if you have an allergy to CHG or antibacterial soaps.  If your skin becomes reddened/irritated stop using the CHG and inform your nurse when you arrive at Short Stay. Do not shave (including legs and underarms) for at least 48 hours prior to the first CHG shower.  You may shave your face/neck. Please follow these instructions carefully:  1.  Shower with CHG Soap the night before surgery and the   morning of Surgery.  2.  If you choose to wash your hair, wash your hair first as usual with your  normal  shampoo.  3.  After you shampoo, rinse your hair and body thoroughly to remove the  shampoo.                           4.  Use CHG as you would any other liquid soap.  You can apply chg directly  to the skin and wash                       Gently with a scrungie or clean washcloth.  5.  Apply the CHG Soap to your body ONLY FROM THE NECK DOWN.   Do not use on face/ open                           Wound or open sores. Avoid contact with eyes, ears mouth and genitals (private parts).                       Wash face,  Genitals (private parts) with your normal soap.             6.  Wash thoroughly, paying special attention to the  area where your surgery  will be performed.  7.  Thoroughly rinse your body with warm water from the neck down.  8.  DO NOT shower/wash with your normal soap after using and rinsing off  the CHG Soap.                9.  Pat yourself dry with a clean towel.            10.  Wear clean pajamas.            11.  Place clean sheets on your bed the night of your first shower and do not  sleep with pets. Day of Surgery : Do not apply any lotions/deodorants the morning of surgery.  Please wear clean clothes to the hospital/surgery center.  FAILURE TO FOLLOW THESE INSTRUCTIONS MAY RESULT IN THE CANCELLATION OF YOUR SURGERY PATIENT SIGNATURE_________________________________  NURSE SIGNATURE__________________________________  ________________________________________________________________________

## 2013-12-28 ENCOUNTER — Encounter (HOSPITAL_COMMUNITY): Payer: Self-pay

## 2013-12-28 ENCOUNTER — Encounter (HOSPITAL_COMMUNITY)
Admission: RE | Admit: 2013-12-28 | Discharge: 2013-12-28 | Disposition: A | Discharge status: Home / Self Care | Payer: Medicare Other | Source: Ambulatory Visit | Attending: Interventional Radiology | Admitting: Interventional Radiology

## 2013-12-28 ENCOUNTER — Encounter (INDEPENDENT_AMBULATORY_CARE_PROVIDER_SITE_OTHER): Payer: Self-pay

## 2013-12-28 ENCOUNTER — Ambulatory Visit (HOSPITAL_COMMUNITY)
Admission: RE | Admit: 2013-12-28 | Discharge: 2013-12-28 | Disposition: A | Discharge status: Home / Self Care | Payer: Medicare Other | Source: Ambulatory Visit | Attending: Anesthesiology | Admitting: Anesthesiology

## 2013-12-28 DIAGNOSIS — C229 Malignant neoplasm of liver, not specified as primary or secondary: Secondary | ICD-10-CM | POA: Diagnosis not present

## 2013-12-28 DIAGNOSIS — B1920 Unspecified viral hepatitis C without hepatic coma: Secondary | ICD-10-CM | POA: Insufficient documentation

## 2013-12-28 LAB — CBC WITH DIFFERENTIAL/PLATELET
BASOS PCT: 1 % (ref 0–1)
Basophils Absolute: 0.1 10*3/uL (ref 0.0–0.1)
Eosinophils Absolute: 0.2 10*3/uL (ref 0.0–0.7)
Eosinophils Relative: 3 % (ref 0–5)
HEMATOCRIT: 41.3 % (ref 39.0–52.0)
Hemoglobin: 14.4 g/dL (ref 13.0–17.0)
Lymphocytes Relative: 46 % (ref 12–46)
Lymphs Abs: 2.7 10*3/uL (ref 0.7–4.0)
MCH: 29.7 pg (ref 26.0–34.0)
MCHC: 34.9 g/dL (ref 30.0–36.0)
MCV: 85.2 fL (ref 78.0–100.0)
MONO ABS: 0.4 10*3/uL (ref 0.1–1.0)
Monocytes Relative: 7 % (ref 3–12)
NEUTROS ABS: 2.5 10*3/uL (ref 1.7–7.7)
NEUTROS PCT: 43 % (ref 43–77)
Platelets: 202 10*3/uL (ref 150–400)
RBC: 4.85 MIL/uL (ref 4.22–5.81)
RDW: 15.4 % (ref 11.5–15.5)
WBC: 5.8 10*3/uL (ref 4.0–10.5)

## 2013-12-28 LAB — COMPREHENSIVE METABOLIC PANEL
ALBUMIN: 4 g/dL (ref 3.5–5.2)
ALT: 13 U/L (ref 0–53)
ANION GAP: 17 — AB (ref 5–15)
AST: 27 U/L (ref 0–37)
Alkaline Phosphatase: 89 U/L (ref 39–117)
BUN: 11 mg/dL (ref 6–23)
CO2: 24 mEq/L (ref 19–32)
Calcium: 9.9 mg/dL (ref 8.4–10.5)
Chloride: 98 mEq/L (ref 96–112)
Creatinine, Ser: 0.75 mg/dL (ref 0.50–1.35)
GFR calc Af Amer: >90 mL/min (ref >90)
GFR calc non Af Amer: >90 mL/min (ref >90)
Glucose, Bld: 157 mg/dL — ABNORMAL HIGH (ref 70–99)
Potassium: 4.1 mEq/L (ref 3.7–5.3)
Sodium: 139 mEq/L (ref 137–147)
TOTAL PROTEIN: 7.8 g/dL (ref 6.0–8.3)
Total Bilirubin: 0.4 mg/dL (ref 0.3–1.2)

## 2013-12-28 LAB — PROTIME-INR
INR: 1.12 (ref 0.00–1.49)
PROTHROMBIN TIME: 14.4 s (ref 11.6–15.2)

## 2013-12-28 LAB — APTT: aPTT: 33 seconds (ref 24–37)

## 2013-12-28 NOTE — Progress Notes (Signed)
Pt in for pre op with mild[facility] from [facility], pt will have sitter until goes to room day of surgery mary brown.

## 2014-01-01 ENCOUNTER — Telehealth: Payer: Self-pay | Admitting: Interventional Radiology

## 2014-01-01 NOTE — Progress Notes (Signed)
Returned call to pt's brother Marcello Moores [phone], answered his questions re MR findings, treatment options, perc ablation technique, followup plan, and need for continued surveillance. Had all his questions answered.

## 2014-01-02 ENCOUNTER — Other Ambulatory Visit: Payer: Self-pay | Admitting: Radiology

## 2014-01-03 ENCOUNTER — Encounter (INDEPENDENT_AMBULATORY_CARE_PROVIDER_SITE_OTHER): Payer: Self-pay

## 2014-01-03 ENCOUNTER — Other Ambulatory Visit: Payer: Self-pay | Admitting: Radiology

## 2014-01-04 ENCOUNTER — Encounter (HOSPITAL_COMMUNITY)
Admission: RE | Disposition: A | Discharge status: Home / Self Care | Payer: Self-pay | Source: Ambulatory Visit | Attending: Internal Medicine

## 2014-01-04 ENCOUNTER — Observation Stay (HOSPITAL_COMMUNITY)
Admission: RE | Admit: 2014-01-04 | Discharge: 2014-01-05 | Disposition: A | Discharge status: Home / Self Care | Payer: Medicare Other | Source: Ambulatory Visit | Attending: Internal Medicine | Admitting: Internal Medicine

## 2014-01-04 ENCOUNTER — Ambulatory Visit (HOSPITAL_COMMUNITY)
Admission: RE | Admit: 2014-01-04 | Discharge: 2014-01-04 | Disposition: A | Discharge status: Home / Self Care | Payer: Medicare Other | Source: Ambulatory Visit | Attending: Interventional Radiology | Admitting: Interventional Radiology

## 2014-01-04 ENCOUNTER — Ambulatory Visit (HOSPITAL_COMMUNITY): Payer: Medicare Other | Admitting: Anesthesiology

## 2014-01-04 ENCOUNTER — Observation Stay (HOSPITAL_COMMUNITY)
Admission: RE | Admit: 2014-01-04 | Discharge: 2014-01-04 | Disposition: A | Discharge status: Home / Self Care | Payer: Medicare Other | Source: Ambulatory Visit | Attending: Interventional Radiology | Admitting: Interventional Radiology

## 2014-01-04 ENCOUNTER — Encounter (HOSPITAL_COMMUNITY): Payer: Medicare Other | Admitting: Anesthesiology

## 2014-01-04 ENCOUNTER — Encounter (HOSPITAL_COMMUNITY): Payer: Self-pay

## 2014-01-04 ENCOUNTER — Other Ambulatory Visit: Payer: Self-pay | Admitting: Radiology

## 2014-01-04 DIAGNOSIS — Z794 Long term (current) use of insulin: Secondary | ICD-10-CM | POA: Insufficient documentation

## 2014-01-04 DIAGNOSIS — E119 Type 2 diabetes mellitus without complications: Secondary | ICD-10-CM | POA: Insufficient documentation

## 2014-01-04 DIAGNOSIS — Z7982 Long term (current) use of aspirin: Secondary | ICD-10-CM | POA: Diagnosis not present

## 2014-01-04 DIAGNOSIS — I10 Essential (primary) hypertension: Secondary | ICD-10-CM | POA: Insufficient documentation

## 2014-01-04 DIAGNOSIS — C220 Liver cell carcinoma: Secondary | ICD-10-CM

## 2014-01-04 DIAGNOSIS — C228 Malignant neoplasm of liver, primary, unspecified as to type: Secondary | ICD-10-CM | POA: Diagnosis present

## 2014-01-04 DIAGNOSIS — K861 Other chronic pancreatitis: Secondary | ICD-10-CM | POA: Insufficient documentation

## 2014-01-04 DIAGNOSIS — Z79899 Other long term (current) drug therapy: Secondary | ICD-10-CM | POA: Diagnosis not present

## 2014-01-04 DIAGNOSIS — B182 Chronic viral hepatitis C: Secondary | ICD-10-CM

## 2014-01-04 DIAGNOSIS — K7460 Unspecified cirrhosis of liver: Secondary | ICD-10-CM | POA: Diagnosis not present

## 2014-01-04 DIAGNOSIS — E785 Hyperlipidemia, unspecified: Secondary | ICD-10-CM | POA: Insufficient documentation

## 2014-01-04 DIAGNOSIS — F17200 Nicotine dependence, unspecified, uncomplicated: Secondary | ICD-10-CM | POA: Insufficient documentation

## 2014-01-04 DIAGNOSIS — H269 Unspecified cataract: Secondary | ICD-10-CM | POA: Insufficient documentation

## 2014-01-04 LAB — GLUCOSE, CAPILLARY
GLUCOSE-CAPILLARY: 201 mg/dL — AB (ref 70–99)
Glucose-Capillary: 189 mg/dL — ABNORMAL HIGH (ref 70–99)
Glucose-Capillary: 239 mg/dL — ABNORMAL HIGH (ref 70–99)
Glucose-Capillary: 205 mg/dL — ABNORMAL HIGH (ref 70–99)
Glucose-Capillary: 204 mg/dL — ABNORMAL HIGH (ref 70–99)

## 2014-01-04 LAB — ABO/RH: ABO/RH(D): O POS

## 2014-01-04 LAB — TYPE AND SCREEN
ABO/RH(D): O POS
Antibody Screen: NEGATIVE

## 2014-01-04 SURGERY — RADIO FREQUENCY ABLATION
Anesthesia: General

## 2014-01-04 MED ORDER — PROMETHAZINE HCL 25 MG/ML IJ SOLN
6.2500 mg | INTRAMUSCULAR | Status: DC | PRN
Start: 2014-01-04 — End: 2014-01-04

## 2014-01-04 MED ORDER — CEFAZOLIN SODIUM-DEXTROSE 2-3 GM-% IV SOLR
2.0000 g | INTRAVENOUS | Status: AC
  Administered 2014-01-04: 2 g via INTRAVENOUS
  Filled 2014-01-04: qty 50

## 2014-01-04 MED ORDER — ATROPINE SULFATE 0.4 MG/ML IJ SOLN
INTRAMUSCULAR | Status: AC
  Filled 2014-01-04: qty 1

## 2014-01-04 MED ORDER — LIDOCAINE HCL (CARDIAC) 20 MG/ML IV SOLN
INTRAVENOUS | Status: DC | PRN
  Administered 2014-01-04: 100 mg via INTRAVENOUS

## 2014-01-04 MED ORDER — ROCURONIUM BROMIDE 100 MG/10ML IV SOLN
INTRAVENOUS | Status: DC | PRN
  Administered 2014-01-04: 30 mg via INTRAVENOUS
  Administered 2014-01-04: 5 mg via INTRAVENOUS

## 2014-01-04 MED ORDER — MEPERIDINE HCL 50 MG/ML IJ SOLN: 6.2500 mg | INTRAMUSCULAR | Status: DC | PRN

## 2014-01-04 MED ORDER — IOHEXOL 300 MG/ML  SOLN
100.0000 mL | Freq: Once | INTRAMUSCULAR | Status: AC | PRN
  Administered 2014-01-04: 100 mL via INTRAVENOUS

## 2014-01-04 MED ORDER — PROPOFOL 10 MG/ML IV BOLUS
INTRAVENOUS | Status: DC | PRN
  Administered 2014-01-04: 170 mg via INTRAVENOUS

## 2014-01-04 MED ORDER — INSULIN ASPART 100 UNIT/ML ~~LOC~~ SOLN
0.0000 [IU] | Freq: Every day | SUBCUTANEOUS | Status: DC
  Administered 2014-01-04: 2 [IU] via SUBCUTANEOUS

## 2014-01-04 MED ORDER — TRAZODONE HCL 50 MG PO TABS
75.0000 mg | ORAL_TABLET | Freq: Every day | ORAL | Status: DC
  Administered 2014-01-04: 75 mg via ORAL
  Filled 2014-01-04 (×2): qty 1

## 2014-01-04 MED ORDER — SENNOSIDES-DOCUSATE SODIUM 8.6-50 MG PO TABS: 1.0000 | ORAL_TABLET | Freq: Every day | ORAL | Status: DC | PRN

## 2014-01-04 MED ORDER — LACTATED RINGERS IV SOLN
INTRAVENOUS | Status: DC
  Administered 2014-01-04: 08:00:00 via INTRAVENOUS

## 2014-01-04 MED ORDER — INSULIN ASPART 100 UNIT/ML ~~LOC~~ SOLN
0.0000 [IU] | Freq: Three times a day (TID) | SUBCUTANEOUS | Status: DC
  Administered 2014-01-04: 3 [IU] via SUBCUTANEOUS
  Administered 2014-01-05: 2 [IU] via SUBCUTANEOUS

## 2014-01-04 MED ORDER — HYDROMORPHONE HCL PF 1 MG/ML IJ SOLN
0.2500 mg | INTRAMUSCULAR | Status: DC | PRN
  Administered 2014-01-04 (×2): 0.25 mg via INTRAVENOUS

## 2014-01-04 MED ORDER — IOHEXOL 300 MG/ML  SOLN
75.0000 mL | Freq: Once | INTRAMUSCULAR | Status: AC | PRN
  Administered 2014-01-04: 75 mL via INTRAVENOUS

## 2014-01-04 MED ORDER — VITAMIN D 1000 UNITS PO TABS
2000.0000 [IU] | ORAL_TABLET | Freq: Every morning | ORAL | Status: DC
  Administered 2014-01-05: 2000 [IU] via ORAL
  Filled 2014-01-04 (×2): qty 2

## 2014-01-04 MED ORDER — GLYCOPYRROLATE 0.2 MG/ML IJ SOLN
INTRAMUSCULAR | Status: DC | PRN
  Administered 2014-01-04: 0.4 mg via INTRAVENOUS

## 2014-01-04 MED ORDER — MIDAZOLAM HCL 5 MG/5ML IJ SOLN
INTRAMUSCULAR | Status: DC | PRN
  Administered 2014-01-04 (×2): 1 mg via INTRAVENOUS

## 2014-01-04 MED ORDER — OXYCODONE HCL 5 MG/5ML PO SOLN
5.0000 mg | Freq: Once | ORAL | Status: DC | PRN
  Filled 2014-01-04: qty 5

## 2014-01-04 MED ORDER — SIMVASTATIN 10 MG PO TABS
10.0000 mg | ORAL_TABLET | Freq: Every day | ORAL | Status: DC
  Administered 2014-01-04: 10 mg via ORAL
  Filled 2014-01-04 (×2): qty 1

## 2014-01-04 MED ORDER — INSULIN ASPART 100 UNIT/ML ~~LOC~~ SOLN
SUBCUTANEOUS | Status: AC
  Administered 2014-01-05: 2 [IU] via SUBCUTANEOUS
  Filled 2014-01-04: qty 1

## 2014-01-04 MED ORDER — INFLUENZA VAC SPLIT QUAD 0.5 ML IM SUSY
0.5000 mL | PREFILLED_SYRINGE | INTRAMUSCULAR | Status: AC
  Administered 2014-01-05: 0.5 mL via INTRAMUSCULAR
  Filled 2014-01-04 (×2): qty 0.5

## 2014-01-04 MED ORDER — DOCUSATE SODIUM 100 MG PO CAPS
100.0000 mg | ORAL_CAPSULE | Freq: Two times a day (BID) | ORAL | Status: DC
Start: 2014-01-04 — End: 2014-01-05
  Administered 2014-01-04 – 2014-01-05 (×2): 100 mg via ORAL
  Filled 2014-01-04 (×4): qty 1

## 2014-01-04 MED ORDER — FENTANYL CITRATE 0.05 MG/ML IJ SOLN
INTRAMUSCULAR | Status: AC
  Filled 2014-01-04: qty 5

## 2014-01-04 MED ORDER — HYDROCHLOROTHIAZIDE 12.5 MG PO CAPS
12.5000 mg | ORAL_CAPSULE | Freq: Every day | ORAL | Status: DC
  Administered 2014-01-04 – 2014-01-05 (×2): 12.5 mg via ORAL
  Filled 2014-01-04 (×2): qty 1

## 2014-01-04 MED ORDER — PIPERACILLIN-TAZOBACTAM 3.375 G IVPB
3.3750 g | Freq: Three times a day (TID) | INTRAVENOUS | Status: DC
  Filled 2014-01-04 (×2): qty 50

## 2014-01-04 MED ORDER — AMLODIPINE BESYLATE 5 MG PO TABS
5.0000 mg | ORAL_TABLET | Freq: Every morning | ORAL | Status: DC
  Administered 2014-01-04 – 2014-01-05 (×2): 5 mg via ORAL
  Filled 2014-01-04 (×2): qty 1

## 2014-01-04 MED ORDER — ONDANSETRON HCL 4 MG/2ML IJ SOLN
INTRAMUSCULAR | Status: DC | PRN
  Administered 2014-01-04: 4 mg via INTRAVENOUS

## 2014-01-04 MED ORDER — CITALOPRAM HYDROBROMIDE 40 MG PO TABS
40.0000 mg | ORAL_TABLET | Freq: Every morning | ORAL | Status: DC
  Administered 2014-01-04 – 2014-01-05 (×2): 40 mg via ORAL
  Filled 2014-01-04 (×2): qty 1

## 2014-01-04 MED ORDER — ONDANSETRON HCL 4 MG/2ML IJ SOLN
4.0000 mg | Freq: Four times a day (QID) | INTRAMUSCULAR | Status: DC | PRN
  Administered 2014-01-05: 4 mg via INTRAVENOUS
  Filled 2014-01-04 (×2): qty 2

## 2014-01-04 MED ORDER — HYDROCODONE-ACETAMINOPHEN 5-325 MG PO TABS
1.0000 | ORAL_TABLET | ORAL | Status: DC | PRN
  Administered 2014-01-04 – 2014-01-05 (×2): 2 via ORAL
  Filled 2014-01-04 (×3): qty 2

## 2014-01-04 MED ORDER — ROCURONIUM BROMIDE 100 MG/10ML IV SOLN
INTRAVENOUS | Status: AC
  Filled 2014-01-04: qty 1

## 2014-01-04 MED ORDER — LISINOPRIL 20 MG PO TABS
20.0000 mg | ORAL_TABLET | Freq: Every day | ORAL | Status: DC
  Administered 2014-01-04 – 2014-01-05 (×2): 20 mg via ORAL
  Filled 2014-01-04 (×2): qty 1

## 2014-01-04 MED ORDER — FENTANYL CITRATE 0.05 MG/ML IJ SOLN
INTRAMUSCULAR | Status: DC | PRN
  Administered 2014-01-04 (×2): 50 ug via INTRAVENOUS

## 2014-01-04 MED ORDER — SUCCINYLCHOLINE CHLORIDE 20 MG/ML IJ SOLN
INTRAMUSCULAR | Status: DC | PRN
  Administered 2014-01-04: 100 mg via INTRAVENOUS

## 2014-01-04 MED ORDER — LISINOPRIL-HYDROCHLOROTHIAZIDE 20-12.5 MG PO TABS: 2.0000 | ORAL_TABLET | Freq: Every morning | ORAL | Status: DC

## 2014-01-04 MED ORDER — NEOSTIGMINE METHYLSULFATE 10 MG/10ML IV SOLN
INTRAVENOUS | Status: DC | PRN
  Administered 2014-01-04: 3 mg via INTRAVENOUS

## 2014-01-04 MED ORDER — PANTOPRAZOLE SODIUM 40 MG PO TBEC
40.0000 mg | DELAYED_RELEASE_TABLET | Freq: Every day | ORAL | Status: DC
  Administered 2014-01-04 – 2014-01-05 (×2): 40 mg via ORAL
  Filled 2014-01-04 (×2): qty 1

## 2014-01-04 MED ORDER — HYDROMORPHONE HCL PF 1 MG/ML IJ SOLN
INTRAMUSCULAR | Status: AC
  Filled 2014-01-04: qty 1

## 2014-01-04 MED ORDER — OXYCODONE HCL 5 MG PO TABS: 5.0000 mg | ORAL_TABLET | Freq: Once | ORAL | Status: DC | PRN

## 2014-01-04 MED ORDER — INSULIN ASPART 100 UNIT/ML ~~LOC~~ SOLN
5.0000 [IU] | Freq: Three times a day (TID) | SUBCUTANEOUS | Status: DC
  Administered 2014-01-04: 5 [IU] via SUBCUTANEOUS

## 2014-01-04 MED ORDER — MIDAZOLAM HCL 2 MG/2ML IJ SOLN
INTRAMUSCULAR | Status: AC
  Filled 2014-01-04: qty 2

## 2014-01-04 MED ORDER — NADOLOL 80 MG PO TABS
80.0000 mg | ORAL_TABLET | Freq: Every morning | ORAL | Status: DC
  Administered 2014-01-04 – 2014-01-05 (×2): 80 mg via ORAL
  Filled 2014-01-04 (×2): qty 1

## 2014-01-04 MED ORDER — INSULIN ASPART 100 UNIT/ML ~~LOC~~ SOLN: 3.0000 [IU] | Freq: Three times a day (TID) | SUBCUTANEOUS | Status: DC

## 2014-01-04 MED ORDER — INSULIN DETEMIR 100 UNIT/ML ~~LOC~~ SOLN
41.0000 [IU] | Freq: Every morning | SUBCUTANEOUS | Status: DC
  Filled 2014-01-04: qty 0.41

## 2014-01-04 MED ORDER — PIPERACILLIN-TAZOBACTAM 3.375 G IVPB
3.3750 g | Freq: Three times a day (TID) | INTRAVENOUS | Status: DC
  Administered 2014-01-04 – 2014-01-05 (×2): 3.375 g via INTRAVENOUS
  Filled 2014-01-04 (×3): qty 50

## 2014-01-04 NOTE — Progress Notes (Signed)
Inpatient Diabetes Program Recommendations  AACE/ADA: New Consensus Statement on Inpatient Glycemic Control (2013)  Target Ranges:  Prepandial:   less than 140 mg/dL      Peak postprandial:   less than 180 mg/dL (1-2 hours)      Critically ill patients:  140 - 180 mg/dL     Results for Nicholas Becker, Nicholas Becker (MRN [id number]) as of 01/04/2014 12:28  Ref. Range 01/04/2014 07:35 01/04/2014 10:53  Glucose-Capillary Latest Range: 70-99 mg/dL 189 (H) 201 (H)     Patient admitted for ablation of liver lesions.  History of DM, HTN, Cirrhosis.  Home DM Meds: Levemir 41 units QAM Novolog 5 units tid with meals Metformin 1000 mg bid  Current Orders: Levemir 41 units QAM Novolog 5 units tid with meals   MD- If patient not discharged first thing in AM, please consider the following:  1. Start Novolog Sensitive SSI tid ac + HS 2. Change diet to Carbohydrate Modified diet (currently ordered as Regular diet) 3. Place the following Hold parameters on Novolog 5 units tid with meals order- "Hold if patient NPO, Hold if patient eats <50% of meal, Hold if pre-meal CBG <80 mg/dl"     Will follow Wyn Quaker RN, MSN, CDE Diabetes Coordinator Inpatient Diabetes Program Team Pager: [phone] (8a-10p)

## 2014-01-04 NOTE — H&P (Signed)
Chief Complaint: hepatocellular carcinoma   Referring Physician(s): Dr. Redgie Grayer   History of Present Illness: Nicholas Becker is a 58 y.o. male with hepatitis C, cirrhosis, rising AFP and enlarging multifocal liver lesions compatible with [facility] who presents today following IR consultation for elective CT guided percutaneous microwave/thermal ablation of the liver lesions.  Past Medical History  Diagnosis Date  . Diabetes mellitus   . Hypertension     normal Bmet in 2012  . Hyperlipemia   . Chronic pancreatitis     calcified  . Tobacco abuse     1/2 pack per day  . Chronic hepatitis C with cirrhosis 2006    Secondary to hepatitis C and ethanol abuse, which is now in remission; varices-status post banding at [facility]; normal LFTs in 2009; thrombocytopenia In the past, platelets-153,000 in 2012  . Cholelithiasis 2009  . Bronchiectasis 2009    Left lower lobe; normal chest x-ray in 2012  . Cataracts, bilateral   . Overweight(278.02)   . 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 Nephrolithiasis 12/31/2010  . Substance abuse     Cocaine, marijuana, alcohol, stopped 10 yrs ago    Past Surgical History  Procedure Laterality Date  . Percutaneous liver biopsy  2006    Allergies: Review of patient's allergies indicates no known allergies.  Medications: Prior to Admission medications   Medication Sig Start Date End Date Taking? Authorizing Provider  amLODipine (NORVASC) 5 MG tablet Take 5 mg by mouth every morning.     Historical Provider, MD  aspirin 81 MG chewable tablet Chew 81 mg by mouth every morning.    Historical Provider, MD  Cholecalciferol (VITAMIN D) 2000 UNITS tablet Take 2,000 Units by mouth every morning.     Historical Provider, MD  citalopram (CELEXA) 40 MG tablet Take 40 mg by mouth every morning.     Historical Provider, MD  insulin detemir (LEVEMIR) 100 UNIT/ML injection Inject 41 Units into the skin every morning.     Historical Provider, MD    insulin lispro (HUMALOG) 100 UNIT/ML injection Inject 5 Units into the skin 3 (three) times daily before meals.    Historical Provider, MD  lisinopril-hydrochlorothiazide (PRINZIDE,ZESTORETIC) 20-12.5 MG per tablet Take 2 tablets by mouth every morning.     Historical Provider, MD  metFORMIN (GLUCOPHAGE) 1000 MG tablet Take 1,000 mg by mouth 2 (two) times daily with a meal.     Historical Provider, MD  Multiple Vitamin (MULTIVITAMIN) tablet Take 1 tablet by mouth every morning.     Historical Provider, MD  nadolol (CORGARD) 80 MG tablet Take 80 mg by mouth every morning.     Historical Provider, MD  Omega-3 Fatty Acids (FISH OIL) 1000 MG CAPS Take 1,000 mg by mouth every morning.     Historical Provider, MD  omeprazole (PRILOSEC) 20 MG capsule Take 20 mg by mouth every morning.     Historical Provider, MD  simvastatin (ZOCOR) 10 MG tablet Take 10 mg by mouth at bedtime.     Historical Provider, MD  traZODone (DESYREL) 150 MG tablet Take 75 mg by mouth at bedtime.     Historical Provider, MD    Family History  Problem Relation Age of Onset  . Healthy Sister   . Lung cancer Brother   . Healthy Sister   . Healthy Sister   . Healthy Sister   . Healthy Brother   . Healthy Brother   . Diabetes Daughter   .  Healthy Son     History   Social History  . Marital Status: Divorced    Spouse Name: N/A    Number of Children: N/A  . Years of Education: N/A   Social History Main Topics  . Smoking status: Current Every Day Smoker -- 0.50 packs/day for 30 years    Types: Cigarettes  . Smokeless tobacco: Never Used     Comment: Patient smokes 1 pack a day  . Alcohol Use: No  . Drug Use: No     Comment: quit coaine alcohol and marijuana 10 years ago  . Sexual Activity: Not on file   Other Topics Concern  . Not on file   Social History Narrative  . No narrative on file         Review of Systems   Constitutional: Negative for fever and chills.  Respiratory: Negative for shortness  of breath.        Occ nonprod cough  Cardiovascular: Negative for chest pain.  Gastrointestinal: Negative for nausea, vomiting, abdominal pain and blood in stool.  Genitourinary: Negative for hematuria.  Musculoskeletal: Negative for back pain.  Neurological: Negative for headaches.  Hematological: Does not bruise/bleed easily.    Vital Signs: BP 137/71  Pulse 49  Temp(Src) 98.3 F (36.8 C) (Oral)  Resp 18  SpO2 99%  Physical Exam  Constitutional: He is oriented to person, place, and time. He appears well-developed and well-nourished.  Cardiovascular:  Bradycardic but regular  Pulmonary/Chest: Effort normal.  Distant but clear BS bilat  Abdominal: Soft. Bowel sounds are normal. He exhibits no distension. There is no tenderness.  Musculoskeletal: Normal range of motion. He exhibits no edema.  Neurological: He is alert and oriented to person, place, and time.    Imaging: Dg Chest 2 View  12/28/2013   CLINICAL DATA:  58 year old male preoperative study for hepatic cellular carcinoma ablation in the setting of chronic hepatitis-C. Initial encounter.  EXAM: CHEST  2 VIEW  COMPARISON:  CT Abdomen and Pelvis 10/23/2012. Chest radiographs 02/17/2008.  FINDINGS: Lung volumes are within normal limits. Mild eventration of the diaphragm. Normal cardiac size and mediastinal contours. Visualized tracheal air column is within normal limits. Chronic paraseptal emphysema or cavitary lesion in the left lower lobe posterior basal segment. Elsewhere the lungs are clear. No pneumothorax or effusion. No acute osseous abnormality identified.  IMPRESSION: Stable.  No acute or metastatic cardiopulmonary abnormality.   Electronically Signed   By: Lars Pinks M.D.   On: 12/28/2013 11:28    Labs: Lab Results  Component Value Date   WBC 5.8 12/28/2013   HCT 41.3 12/28/2013   MCV 85.2 12/28/2013   PLT 202 12/28/2013   NA 139 12/28/2013   K 4.1 12/28/2013   CL 98 12/28/2013   CO2 24 12/28/2013   GLUCOSE 157* 12/28/2013    BUN 11 12/28/2013   CREATININE 0.75 12/28/2013   CALCIUM 9.9 12/28/2013   PROT 7.8 12/28/2013   ALBUMIN 4.0 12/28/2013   AST 27 12/28/2013   ALT 13 12/28/2013   ALKPHOS 89 12/28/2013   BILITOT 0.4 12/28/2013   GFRNONAA >90 12/28/2013   GFRAA >90 12/28/2013   INR 1.12 12/28/2013   AFPTM 619.4* 10/12/2013   Results for orders placed during the hospital encounter of 01/04/14  GLUCOSE, CAPILLARY      Result Value Ref Range   Glucose-Capillary 189 (*) 70 - 99 mg/dL  ABO/RH      Result Value Ref Range   ABO/RH(D) O POS  12/28/2013  BUN 11 CREAT 0.75  K 4.1    PT 14.4  INR 1.12       WBC 5.8  HGB 14.4  PLTS 202K   Assessment and Plan:Nicholas Becker is a 58 y.o. male with hepatitis C, cirrhosis, rising AFP and enlarging multifocal liver lesions compatible with [place] who presents today following IR consultation for elective CT guided percutaneous microwave/thermal ablation of the liver lesions. Details/risks of procedure d/w pt with his understanding and consent. Following procedure pt will be admitted to hospital for overnight observation.  Rowe Robert, PAC

## 2014-01-04 NOTE — Procedures (Signed)
CT guided MW RF ablation liver lesion No complication No blood loss. See complete dictation in [facility].

## 2014-01-04 NOTE — Progress Notes (Signed)
Patient ID: TAKSH HJORT, male   DOB: May 09, 1955, 58 y.o.   MRN: [id number]   Subjective: Pt with some intermittent nausea; no vomiting or sig abd pain/CP or dyspnea    Allergies: Review of patient's allergies indicates no known allergies.  Medications: Prior to Admission medications   Medication Sig Start Date End Date Taking? Authorizing Provider  amLODipine (NORVASC) 5 MG tablet Take 5 mg by mouth every morning.     Historical Provider, MD  aspirin 81 MG chewable tablet Chew 81 mg by mouth every morning.    Historical Provider, MD  Cholecalciferol (VITAMIN D) 2000 UNITS tablet Take 2,000 Units by mouth every morning.     Historical Provider, MD  citalopram (CELEXA) 40 MG tablet Take 40 mg by mouth every morning.     Historical Provider, MD  insulin detemir (LEVEMIR) 100 UNIT/ML injection Inject 41 Units into the skin every morning.     Historical Provider, MD  insulin lispro (HUMALOG) 100 UNIT/ML injection Inject 5 Units into the skin 3 (three) times daily before meals.    Historical Provider, MD  lisinopril-hydrochlorothiazide (PRINZIDE,ZESTORETIC) 20-12.5 MG per tablet Take 2 tablets by mouth every morning.     Historical Provider, MD  metFORMIN (GLUCOPHAGE) 1000 MG tablet Take 1,000 mg by mouth 2 (two) times daily with a meal.     Historical Provider, MD  Multiple Vitamin (MULTIVITAMIN) tablet Take 1 tablet by mouth every morning.     Historical Provider, MD  nadolol (CORGARD) 80 MG tablet Take 80 mg by mouth every morning.     Historical Provider, MD  Omega-3 Fatty Acids (FISH OIL) 1000 MG CAPS Take 1,000 mg by mouth every morning.     Historical Provider, MD  omeprazole (PRILOSEC) 20 MG capsule Take 20 mg by mouth every morning.     Historical Provider, MD  simvastatin (ZOCOR) 10 MG tablet Take 10 mg by mouth at bedtime.     Historical Provider, MD  traZODone (DESYREL) 150 MG tablet Take 75 mg by mouth at bedtime.     Historical Provider, MD    Review of Systems see  above  Vital Signs: BP 171/84  Pulse 53  Temp(Src) 97.7 F (36.5 C) (Oral)  Resp 18  SpO2 100%  Physical Exam pt awake/alert; chest - CTA bilat; heart- brady, reg; abd- soft,+BS,NT, puncture sites epigastric region clean and dry; ext- FROM, no edema  Imaging:No results found.   Labs: Results for orders placed during the hospital encounter of 01/04/14 (from the past 48 hour(s))  TYPE AND SCREEN     Status: None   Collection Time    01/04/14  7:20 AM      Result Value Ref Range   ABO/RH(D) O POS     Antibody Screen NEG     Sample Expiration 01/07/2014    ABO/RH     Status: None   Collection Time    01/04/14  7:30 AM      Result Value Ref Range   ABO/RH(D) O POS    GLUCOSE, CAPILLARY     Status: Abnormal   Collection Time    01/04/14  7:35 AM      Result Value Ref Range   Glucose-Capillary 189 (*) 70 - 99 mg/dL  GLUCOSE, CAPILLARY     Status: Abnormal   Collection Time    01/04/14 10:53 AM      Result Value Ref Range   Glucose-Capillary 201 (*) 70 - 99 mg/dL   Comment 1 Documented in  Chart     Comment 2 Notify RN    GLUCOSE, CAPILLARY     Status: Abnormal   Collection Time    01/04/14 12:25 PM      Result Value Ref Range   Glucose-Capillary 239 (*) 70 - 99 mg/dL   Comment 1 Documented in Chart     Comment 2 Notify RN      Assessment and Plan: s/p MWA one of two liver lesions (HCC) ; for overnight obs; check am labs; advance diet as tolerated; f/u IR clinic visit in 1 month; will need Cipro 500 mg po bid x 1 week upon d/c; consideration will be given to possible TACE of remaining liver lesion at time of next f/u   Nash Mantis

## 2014-01-04 NOTE — Anesthesia Preprocedure Evaluation (Addendum)
Anesthesia Evaluation  Patient identified by MRN, date of birth, ID band Patient awake    Reviewed: Allergy & Precautions, H&P , NPO status , Patient's Chart, lab work & pertinent test results  Airway Mallampati: II TM Distance: >3 FB Neck ROM: Full    Dental no notable dental hx.    Pulmonary Current Smoker,  breath sounds clear to auscultation  Pulmonary exam normal       Cardiovascular hypertension, Pt. on medications Rhythm:Regular Rate:Normal     Neuro/Psych negative neurological ROS  negative psych ROS   GI/Hepatic negative GI ROS, (+) Cirrhosis -      , Hepatitis -, C  Endo/Other  negative endocrine ROSdiabetes  Renal/GU Renal disease     Musculoskeletal negative musculoskeletal ROS (+)   Abdominal   Peds  Hematology negative hematology ROS (+)   Anesthesia Other Findings   Reproductive/Obstetrics                          Anesthesia Physical Anesthesia Plan  ASA: III  Anesthesia Plan: General   Post-op Pain Management:    Induction: Intravenous  Airway Management Planned: Oral ETT  Additional Equipment:   Intra-op Plan:   Post-operative Plan: Extubation in OR  Informed Consent: I have reviewed the patients History and Physical, chart, labs and discussed the procedure including the risks, benefits and alternatives for the proposed anesthesia with the patient or authorized representative who has indicated his/her understanding and acceptance.   Dental advisory given  Plan Discussed with: CRNA  Anesthesia Plan Comments:         Anesthesia Quick Evaluation

## 2014-01-04 NOTE — Anesthesia Postprocedure Evaluation (Signed)
Anesthesia Post Note  Patient: Nicholas Becker  Procedure(s) Performed: Procedure(s) (LRB): MICROWAVE LIVER ABLATION (N/A)  Anesthesia type: General  Patient location: PACU  Post pain: Pain level controlled  Post assessment: Post-op Vital signs reviewed  Last Vitals: BP 180/84  Pulse 50  Temp(Src) 36.2 C (Oral)  Resp 18  SpO2 100%  Post vital signs: Reviewed  Level of consciousness: sedated  Complications: No apparent anesthesia complications

## 2014-01-04 NOTE — Transfer of Care (Signed)
Immediate Anesthesia Transfer of Care Note  Patient: Nicholas Becker  Procedure(s) Performed: Procedure(s): MICROWAVE LIVER ABLATION (N/A)  Patient Location: PACU  Anesthesia Type:General  Level of Consciousness: awake, alert  and oriented  Airway & Oxygen Therapy: Patient Spontanous Breathing and Patient connected to face mask oxygen  Post-op Assessment: Report given to PACU RN and Post -op Vital signs reviewed and stable  Post vital signs: Reviewed and stable  Complications: No apparent anesthesia complications

## 2014-01-05 ENCOUNTER — Other Ambulatory Visit: Payer: Self-pay | Admitting: Radiology

## 2014-01-05 DIAGNOSIS — C228 Malignant neoplasm of liver, primary, unspecified as to type: Secondary | ICD-10-CM | POA: Diagnosis not present

## 2014-01-05 DIAGNOSIS — C220 Liver cell carcinoma: Secondary | ICD-10-CM

## 2014-01-05 LAB — COMPREHENSIVE METABOLIC PANEL
ALK PHOS: 88 U/L (ref 39–117)
ALT: 155 U/L — AB (ref 0–53)
AST: 354 U/L — ABNORMAL HIGH (ref 0–37)
Albumin: 3.6 g/dL (ref 3.5–5.2)
Anion gap: 13 (ref 5–15)
BUN: 6 mg/dL (ref 6–23)
CHLORIDE: 95 meq/L — AB (ref 96–112)
CO2: 26 mEq/L (ref 19–32)
Calcium: 8.8 mg/dL (ref 8.4–10.5)
Creatinine, Ser: 0.72 mg/dL (ref 0.50–1.35)
Glucose, Bld: 186 mg/dL — ABNORMAL HIGH (ref 70–99)
POTASSIUM: 3.4 meq/L — AB (ref 3.7–5.3)
Sodium: 134 mEq/L — ABNORMAL LOW (ref 137–147)
Total Bilirubin: 1 mg/dL (ref 0.3–1.2)
Total Protein: 6.8 g/dL (ref 6.0–8.3)

## 2014-01-05 LAB — CBC
HCT: 40.8 % (ref 39.0–52.0)
HEMOGLOBIN: 14 g/dL (ref 13.0–17.0)
MCH: 29.2 pg (ref 26.0–34.0)
MCHC: 34.3 g/dL (ref 30.0–36.0)
MCV: 85 fL (ref 78.0–100.0)
Platelets: 139 10*3/uL — ABNORMAL LOW (ref 150–400)
RBC: 4.8 MIL/uL (ref 4.22–5.81)
RDW: 15.3 % (ref 11.5–15.5)
WBC: 5.6 10*3/uL (ref 4.0–10.5)

## 2014-01-05 MED ORDER — DSS 100 MG PO CAPS: 100.0000 mg | ORAL_CAPSULE | Freq: Two times a day (BID) | ORAL | Status: DC

## 2014-01-05 MED ORDER — ONDANSETRON HCL 4 MG PO TABS: 4.0000 mg | ORAL_TABLET | Freq: Three times a day (TID) | ORAL | Status: DC | PRN

## 2014-01-05 MED ORDER — CIPROFLOXACIN HCL 500 MG PO TABS: 500.0000 mg | ORAL_TABLET | Freq: Two times a day (BID) | ORAL | Status: AC

## 2014-01-05 MED ORDER — OXYCODONE HCL 5 MG PO TABS: 5.0000 mg | ORAL_TABLET | ORAL | Status: DC | PRN

## 2014-01-05 NOTE — Progress Notes (Signed)
UR completed 

## 2014-01-05 NOTE — Progress Notes (Signed)
Pt given dc instructions, when to call MD, medications, prescriptions, denies pain, voiding, belongings packed. Pt asked appropriate questions and verbalized understanding.  Group home representative to pick up pt, discharge  packet to be given to group home.

## 2014-01-05 NOTE — Progress Notes (Signed)
Physician Discharge Summary  Patient ID: Nicholas Becker MRN: [id number] DOB/AGE: 01-10-1956 58 y.o.  Admit date: 01/04/2014 Discharge date: 01/05/2014  Admission Diagnoses: Active Problems:   Hepatoma  Discharge Diagnoses:  Active Problems:   Hepatoma  S/p microwave ablation right lobe liver lesion.  Procedures: Procedure(s): MICROWAVE LIVER ABLATION OF RIGHT LOBE LIVER LESION.  Discharged Condition: Good, stable.  Hospital Course: CAPONE SCHWINN is a 58 y.o. male with hepatitis C, cirrhosis, rising AFP and enlarging multifocal liver lesions compatible with [place] who presented 01/04/14 as an outpatient for elective CT guided percutaneous microwave/thermal ablation of the liver lesions after seen in consult with IR on 11/20/13. He underwent the procedure of right lobe liver lesion ablation only without complications and was admitted for overnight observation. He was extubated without complications and serial vitals monitored overnight. He is afebrile today and labs reveal minimal elevation in LFT's not unexpected. He admits to slight RUQ pain that is controlled with pain medication. He rates his RUQ pain 5/10. He denies any vomiting, but does have some nausea. He was able to eat breakfast without emesis. He has urinated on his own and ambulated the halls without lightheadedness or dizziness. He states he is ready to go home. He denies any bleeding, chest pain or shortness of breath. He was given instructions on medications prescribed and will follow up with Dr. Vernard Gambles in IR clinic in 1 month to discuss further treatment including DEB-TACE of second larger liver lesion. He states understanding of the above plan. Dr. Vernard Gambles has seen and examined the patient and he is deemed stable for discharge home today.  Consults: Anesthesia  Discharge Exam: Blood pressure 131/81, pulse 55, temperature 98.1 F (36.7 C), temperature source Oral, resp. rate 16, height 5\' 4"  (1.626 m), weight 133 lb  (60.328 kg), SpO2 100.00%.  PE: General: A&Ox3, NAD, sitting up in bed Heart: RRR without M/G/R Lungs: CTA bilaterally Abd: RUQ access sites dressing C/D/I, no signs of bleeding/hematoma, slight tenderness to deep palpation. ND, (+) BS Ext: FROM without edema bilaterally  Disposition: 01-Home or Self Care      Discharge Instructions   Call MD for:  difficulty breathing, headache or visual disturbances    Complete by:  As directed      Call MD for:  extreme fatigue    Complete by:  As directed      Call MD for:  hives    Complete by:  As directed      Call MD for:  persistant dizziness or light-headedness    Complete by:  As directed      Call MD for:  persistant nausea and vomiting    Complete by:  As directed      Call MD for:  redness, tenderness, or signs of infection (pain, swelling, redness, odor or green/yellow discharge around incision site)    Complete by:  As directed      Call MD for:  severe uncontrolled pain    Complete by:  As directed      Call MD for:  temperature >100.4    Complete by:  As directed      Diet - low sodium heart healthy    Complete by:  As directed      Driving Restrictions    Complete by:  As directed   No driving x 3 days     Increase activity slowly    Complete by:  As directed      Lifting restrictions  Complete by:  As directed   No lifting > 15 lbs x 1 week     Remove dressing in 24 hours    Complete by:  As directed             Medication List         amLODipine 5 MG tablet  Commonly known as:  NORVASC  Take 5 mg by mouth every morning.     aspirin 81 MG chewable tablet  Chew 81 mg by mouth every morning.     ciprofloxacin 500 MG tablet  Commonly known as:  CIPRO  Take 1 tablet (500 mg total) by mouth 2 (two) times daily. X 1 week     citalopram 40 MG tablet  Commonly known as:  CELEXA  Take 40 mg by mouth every morning.     DSS 100 MG Caps  Take 100 mg by mouth 2 (two) times daily.     Fish Oil 1000 MG Caps   Take 1,000 mg by mouth every morning.     insulin lispro 100 UNIT/ML injection  Commonly known as:  HUMALOG  Inject 5 Units into the skin 3 (three) times daily before meals.     LEVEMIR 100 UNIT/ML injection  Generic drug:  insulin detemir  Inject 41 Units into the skin every morning.     lisinopril-hydrochlorothiazide 20-12.5 MG per tablet  Commonly known as:  PRINZIDE,ZESTORETIC  Take 2 tablets by mouth every morning.     metFORMIN 1000 MG tablet  Commonly known as:  GLUCOPHAGE  Take 1,000 mg by mouth 2 (two) times daily with a meal.     multivitamin tablet  Take 1 tablet by mouth every morning.     nadolol 80 MG tablet  Commonly known as:  CORGARD  Take 80 mg by mouth every morning.     omeprazole 20 MG capsule  Commonly known as:  PRILOSEC  Take 20 mg by mouth every morning.     ondansetron 4 MG tablet  Commonly known as:  ZOFRAN  Take 1 tablet (4 mg total) by mouth every 8 (eight) hours as needed for nausea or vomiting.     oxyCODONE 5 MG immediate release tablet  Commonly known as:  ROXICODONE  Take 1 tablet (5 mg total) by mouth every 4 (four) hours as needed for severe pain.     simvastatin 10 MG tablet  Commonly known as:  ZOCOR  Take 10 mg by mouth at bedtime.     traZODone 150 MG tablet  Commonly known as:  DESYREL  Take 75 mg by mouth at bedtime.     Vitamin D 2000 UNITS tablet  Take 2,000 Units by mouth every morning.       Follow-up Information   Follow up with HASSELL Enrigue Catena, MD In 1 month. (Our office will call with appointment [phone])    Specialty:  Interventional Radiology   Contact information:   [address] [place] [address] [phone]       Signed: Hedy Jacob PA-C 01/05/2014, 9:18 AM

## 2014-01-05 NOTE — Progress Notes (Signed)
POD#1 post hepatoma percutaneous ablation  Subjective: Minimal body wall soreness, controlled with Vicodin. No more nausea.  Objective: Vital signs in last 24 hours: Temp:  [97.1 F (36.2 C)-98.8 F (37.1 C)] 98.1 F (36.7 C) (09/12 0539) Pulse Rate:  [50-58] 55 (09/12 0539) Resp:  [16-18] 16 (09/12 0539) BP: (131-186)/(56-84) 131/81 mmHg (09/12 0539) SpO2:  [99 %-100 %] 100 % (09/12 0539) Weight:  [133 lb (60.328 kg)] 133 lb (60.328 kg) (09/11 1230)    Intake/Output from previous day: 09/11 0701 - 09/12 0700 In: 1799.1 [I.V.:1749.1; IV Piggyback:50] Out: 2920 [Urine:2920] Intake/Output this shift: Total I/O In: 1799.1 [I.V.:1749.1; IV Piggyback:50] Out: 2920 [Urine:2920]  GI: normal findings: soft, non-tender  Lab Results:   Recent Labs  01/05/14 0405  WBC 5.6  HGB 14.0  HCT 40.8  PLT 139*   BMET  Recent Labs  01/05/14 0405  NA 134*  K 3.4*  CL 95*  CO2 26  GLUCOSE 186*  BUN 6  CREATININE 0.72  CALCIUM 8.8   PT/INR No results found for this basename: LABPROT, INR,  in the last 72 hours ABG No results found for this basename: PHART, PCO2, PO2, HCO3,  in the last 72 hours  Studies/Results: Ct Guide Tissue Ablation  01/04/2014   : Clinical Data: History of cirrhosis, hepatitis-C. Elevated AFP of with Two enlarging liver lesions on MR , consistent with hepatocellular carcinoma.  CT GUIDED MICROWAVE RF TISSUE ABLATION  Comparison:  CT abdomen performed the same day, and earlier studies  Operators: Mena Pauls  Technique and findings: The procedure, risks (including but not limited to bleeding, infection, organ damage), benefits, and alternatives were explained to the patient. Questions regarding the procedure were encouraged and answered. The patient understands and consents to the procedure.See previous consultation note.  After the patient had been intubated by staff of the Department of Anesthesia, survey axial scanning of the upper abdomen was  performed. The lesion was again localized. Site was marked, prepped with Betadine, draped in usual sterile fashion, infiltrated locally with 1% lidocaine. A 22 gauge needle was placed in the scan under CT fluoroscopy. Following this trajectory, a 15 cm NeuWave 17 gauge needle microwave RF ablation needle was advanced of the lesion under intermittent CT fluoroscopy. Parallel 15 cm NeuWave 17 gauge needle was then placed as planned. The lesion was treated with 65 watts at 10 minutes through both needles. Intermittent scanning during ablation confirmed expected microbubble creation around the tumor. The treatment needles were then removed using cautery setting. The patient was extubated uneventfully and admitted for overnight observation. No immediate complication.  IMPRESSION: 1. Technically successful percutaneous microwave RF ablation of Right lobe liver lesion , under CT guidance.  1.   Electronically Signed   By: Arne Cleveland M.D.   On: 01/04/2014 17:27    Anti-infectives: Anti-infectives   Start     Dose/Rate Route Frequency Ordered Stop   01/04/14 1400  piperacillin-tazobactam (ZOSYN) IVPB 3.375 g  Status:  Discontinued     3.375 g 12.5 mL/hr over 240 Minutes Intravenous 3 times per day 01/04/14 1148 01/04/14 2251   01/04/14 0700  ceFAZolin (ANCEF) IVPB 2 g/50 mL premix     2 g 100 mL/hr over 30 Minutes Intravenous On call 01/04/14 0656 01/04/14 0830      Assessment/Plan: S/p Liver lesion ablation, doing well  Discharge  LOS: 0 days    Nicholas Becker,Nicholas Becker 01/05/2014

## 2014-01-07 LAB — GLUCOSE, CAPILLARY: GLUCOSE-CAPILLARY: 177 mg/dL — AB (ref 70–99)

## 2014-01-09 ENCOUNTER — Other Ambulatory Visit: Payer: Self-pay | Admitting: Interventional Radiology

## 2014-01-09 ENCOUNTER — Telehealth: Payer: Self-pay | Admitting: Interventional Radiology

## 2014-01-09 DIAGNOSIS — C220 Liver cell carcinoma: Secondary | ICD-10-CM

## 2014-01-09 NOTE — Progress Notes (Signed)
Reviewed recent RF ablation procedure with brother Nicholas Becker over the phone. Discussed increase in size of seg 2 lesion, too large for percutaneous ablation. Discussed recommendation of DEB-TACE of residual lesion. He understood and was in agreement to proceed. We can set this up ASAP.

## 2014-01-21 ENCOUNTER — Other Ambulatory Visit (HOSPITAL_COMMUNITY): Payer: Medicare Other

## 2014-01-23 ENCOUNTER — Encounter (HOSPITAL_COMMUNITY): Payer: Self-pay | Admitting: Pharmacy Technician

## 2014-01-24 ENCOUNTER — Other Ambulatory Visit: Payer: Self-pay | Admitting: Radiology

## 2014-01-24 MED ORDER — DEXAMETHASONE SODIUM PHOSPHATE 10 MG/ML IJ SOLN: 10.0000 mg | Freq: Once | INTRAMUSCULAR | Status: DC

## 2014-01-25 ENCOUNTER — Ambulatory Visit (HOSPITAL_COMMUNITY)
Admission: RE | Admit: 2014-01-25 | Discharge: 2014-01-25 | Disposition: A | Discharge status: Home / Self Care | Payer: Medicare Other | Source: Ambulatory Visit | Attending: Interventional Radiology | Admitting: Interventional Radiology

## 2014-01-25 ENCOUNTER — Encounter (HOSPITAL_COMMUNITY): Payer: Self-pay

## 2014-01-25 ENCOUNTER — Observation Stay (HOSPITAL_COMMUNITY)
Admission: RE | Admit: 2014-01-25 | Discharge: 2014-01-26 | Disposition: A | Discharge status: Home / Self Care | Payer: Medicare Other | Source: Ambulatory Visit | Attending: Interventional Radiology | Admitting: Interventional Radiology

## 2014-01-25 VITALS — BP 150/80 | HR 60 | Temp 98.2°F | Resp 14 | Ht 64.0 in | Wt 132.9 lb

## 2014-01-25 DIAGNOSIS — Z7982 Long term (current) use of aspirin: Secondary | ICD-10-CM | POA: Diagnosis not present

## 2014-01-25 DIAGNOSIS — E785 Hyperlipidemia, unspecified: Secondary | ICD-10-CM | POA: Diagnosis not present

## 2014-01-25 DIAGNOSIS — K861 Other chronic pancreatitis: Secondary | ICD-10-CM | POA: Insufficient documentation

## 2014-01-25 DIAGNOSIS — Z794 Long term (current) use of insulin: Secondary | ICD-10-CM | POA: Insufficient documentation

## 2014-01-25 DIAGNOSIS — K7460 Unspecified cirrhosis of liver: Secondary | ICD-10-CM | POA: Diagnosis not present

## 2014-01-25 DIAGNOSIS — F17210 Nicotine dependence, cigarettes, uncomplicated: Secondary | ICD-10-CM | POA: Insufficient documentation

## 2014-01-25 DIAGNOSIS — C220 Liver cell carcinoma: Principal | ICD-10-CM | POA: Insufficient documentation

## 2014-01-25 DIAGNOSIS — E119 Type 2 diabetes mellitus without complications: Secondary | ICD-10-CM | POA: Insufficient documentation

## 2014-01-25 DIAGNOSIS — I10 Essential (primary) hypertension: Secondary | ICD-10-CM | POA: Insufficient documentation

## 2014-01-25 DIAGNOSIS — E663 Overweight: Secondary | ICD-10-CM | POA: Insufficient documentation

## 2014-01-25 DIAGNOSIS — B182 Chronic viral hepatitis C: Secondary | ICD-10-CM | POA: Insufficient documentation

## 2014-01-25 LAB — GLUCOSE, CAPILLARY
Glucose-Capillary: 160 mg/dL — ABNORMAL HIGH (ref 70–99)
Glucose-Capillary: 179 mg/dL — ABNORMAL HIGH (ref 70–99)
Glucose-Capillary: 316 mg/dL — ABNORMAL HIGH (ref 70–99)
Glucose-Capillary: 241 mg/dL — ABNORMAL HIGH (ref 70–99)

## 2014-01-25 LAB — COMPREHENSIVE METABOLIC PANEL
ALK PHOS: 129 U/L — AB (ref 39–117)
ALT: 14 U/L (ref 0–53)
AST: 20 U/L (ref 0–37)
Albumin: 3.3 g/dL — ABNORMAL LOW (ref 3.5–5.2)
Anion gap: 12 (ref 5–15)
BUN: 10 mg/dL (ref 6–23)
CHLORIDE: 98 meq/L (ref 96–112)
CO2: 26 meq/L (ref 19–32)
Calcium: 9.2 mg/dL (ref 8.4–10.5)
Creatinine, Ser: 0.68 mg/dL (ref 0.50–1.35)
GFR calc Af Amer: >90 mL/min (ref >90)
Glucose, Bld: 161 mg/dL — ABNORMAL HIGH (ref 70–99)
POTASSIUM: 3.9 meq/L (ref 3.7–5.3)
Sodium: 136 mEq/L — ABNORMAL LOW (ref 137–147)
Total Bilirubin: 0.5 mg/dL (ref 0.3–1.2)
Total Protein: 7.3 g/dL (ref 6.0–8.3)

## 2014-01-25 LAB — CBC WITH DIFFERENTIAL/PLATELET
BASOS ABS: 0 10*3/uL (ref 0.0–0.1)
Basophils Relative: 1 % (ref 0–1)
Eosinophils Absolute: 0.1 10*3/uL (ref 0.0–0.7)
Eosinophils Relative: 2 % (ref 0–5)
HCT: 38.2 % — ABNORMAL LOW (ref 39.0–52.0)
Hemoglobin: 13 g/dL (ref 13.0–17.0)
LYMPHS PCT: 30 % (ref 12–46)
Lymphs Abs: 1.8 10*3/uL (ref 0.7–4.0)
MCH: 28.5 pg (ref 26.0–34.0)
MCHC: 34 g/dL (ref 30.0–36.0)
MCV: 83.8 fL (ref 78.0–100.0)
MONO ABS: 0.6 10*3/uL (ref 0.1–1.0)
Monocytes Relative: 9 % (ref 3–12)
NEUTROS ABS: 3.5 10*3/uL (ref 1.7–7.7)
NEUTROS PCT: 58 % (ref 43–77)
Platelets: 253 10*3/uL (ref 150–400)
RBC: 4.56 MIL/uL (ref 4.22–5.81)
RDW: 15.6 % — AB (ref 11.5–15.5)
WBC: 6.1 10*3/uL (ref 4.0–10.5)

## 2014-01-25 LAB — PROTIME-INR
INR: 1.21 (ref 0.00–1.49)
Prothrombin Time: 15.4 seconds — ABNORMAL HIGH (ref 11.6–15.2)

## 2014-01-25 MED ORDER — NADOLOL 80 MG PO TABS
80.0000 mg | ORAL_TABLET | Freq: Every morning | ORAL | Status: DC
  Administered 2014-01-26: 80 mg via ORAL
  Filled 2014-01-25: qty 1

## 2014-01-25 MED ORDER — INSULIN DETEMIR 100 UNIT/ML ~~LOC~~ SOLN
41.0000 [IU] | Freq: Every morning | SUBCUTANEOUS | Status: DC
  Administered 2014-01-26: 41 [IU] via SUBCUTANEOUS
  Filled 2014-01-25: qty 0.41

## 2014-01-25 MED ORDER — ACETAMINOPHEN 650 MG RE SUPP
650.0000 mg | Freq: Four times a day (QID) | RECTAL | Status: DC | PRN
Start: 2014-01-25 — End: 2014-01-25

## 2014-01-25 MED ORDER — LIDOCAINE HCL 1 % IJ SOLN
INTRAMUSCULAR | Status: AC
  Filled 2014-01-25: qty 20

## 2014-01-25 MED ORDER — FENTANYL CITRATE 0.05 MG/ML IJ SOLN
INTRAMUSCULAR | Status: AC
  Filled 2014-01-25: qty 6

## 2014-01-25 MED ORDER — INSULIN LISPRO 100 UNIT/ML ~~LOC~~ SOLN: 5.0000 [IU] | Freq: Three times a day (TID) | SUBCUTANEOUS | Status: DC

## 2014-01-25 MED ORDER — FENTANYL CITRATE 0.05 MG/ML IJ SOLN
INTRAMUSCULAR | Status: AC | PRN
  Administered 2014-01-25: 25 ug via INTRAVENOUS
  Administered 2014-01-25: 50 ug via INTRAVENOUS
  Administered 2014-01-25: 25 ug via INTRAVENOUS
  Administered 2014-01-25: 50 ug via INTRAVENOUS

## 2014-01-25 MED ORDER — NITROGLYCERIN 1 MG/10 ML FOR IR/CATH LAB
100.0000 ug | INTRA_ARTERIAL | Status: AC
  Administered 2014-01-25: 100 ug via INTRA_ARTERIAL

## 2014-01-25 MED ORDER — ACETAMINOPHEN 325 MG PO TABS
650.0000 mg | ORAL_TABLET | Freq: Four times a day (QID) | ORAL | Status: DC | PRN
Start: 2014-01-25 — End: 2014-01-25

## 2014-01-25 MED ORDER — DOXORUBICIN HCL 50 MG IV SOLR
150.0000 mg | Freq: Once | INTRAVENOUS | Status: AC
  Administered 2014-01-25: 150 mg via INTRA_ARTERIAL
  Filled 2014-01-25 (×2): qty 150

## 2014-01-25 MED ORDER — OXYCODONE HCL 5 MG PO TABS
5.0000 mg | ORAL_TABLET | ORAL | Status: DC | PRN
  Administered 2014-01-25: 5 mg via ORAL
  Filled 2014-01-25: qty 1

## 2014-01-25 MED ORDER — LISINOPRIL 40 MG PO TABS
40.0000 mg | ORAL_TABLET | Freq: Every day | ORAL | Status: DC
  Administered 2014-01-26: 40 mg via ORAL
  Filled 2014-01-25: qty 1

## 2014-01-25 MED ORDER — MIDAZOLAM HCL 2 MG/2ML IJ SOLN
INTRAMUSCULAR | Status: AC | PRN
  Administered 2014-01-25: 0.5 mg via INTRAVENOUS
  Administered 2014-01-25 (×2): 1 mg via INTRAVENOUS
  Administered 2014-01-25: 0.5 mg via INTRAVENOUS
  Administered 2014-01-25: 1 mg via INTRAVENOUS
  Administered 2014-01-25 (×2): 0.5 mg via INTRAVENOUS

## 2014-01-25 MED ORDER — CITALOPRAM HYDROBROMIDE 40 MG PO TABS
40.0000 mg | ORAL_TABLET | Freq: Every morning | ORAL | Status: DC
Start: 2014-01-26 — End: 2014-01-26
  Administered 2014-01-26: 40 mg via ORAL
  Filled 2014-01-25: qty 1

## 2014-01-25 MED ORDER — PROCHLORPERAZINE EDISYLATE 5 MG/ML IJ SOLN
10.0000 mg | Freq: Four times a day (QID) | INTRAMUSCULAR | Status: DC | PRN
  Administered 2014-01-25: 10 mg via INTRAVENOUS
  Filled 2014-01-25: qty 2

## 2014-01-25 MED ORDER — HYDROCODONE-ACETAMINOPHEN 5-325 MG PO TABS
1.0000 | ORAL_TABLET | ORAL | Status: DC | PRN
  Administered 2014-01-25 – 2014-01-26 (×2): 2 via ORAL
  Filled 2014-01-25 (×2): qty 2

## 2014-01-25 MED ORDER — DOCUSATE SODIUM 100 MG PO CAPS: 100.0000 mg | ORAL_CAPSULE | Freq: Two times a day (BID) | ORAL | Status: DC

## 2014-01-25 MED ORDER — SENNOSIDES-DOCUSATE SODIUM 8.6-50 MG PO TABS: 1.0000 | ORAL_TABLET | Freq: Every evening | ORAL | Status: DC | PRN

## 2014-01-25 MED ORDER — ONDANSETRON 8 MG/NS 50 ML IVPB
8.0000 mg | Freq: Once | INTRAVENOUS | Status: AC
  Administered 2014-01-25: 8 mg via INTRAVENOUS
  Filled 2014-01-25: qty 8

## 2014-01-25 MED ORDER — ONDANSETRON HCL 4 MG PO TABS: 4.0000 mg | ORAL_TABLET | Freq: Three times a day (TID) | ORAL | Status: DC | PRN

## 2014-01-25 MED ORDER — LISINOPRIL-HYDROCHLOROTHIAZIDE 20-12.5 MG PO TABS: 2.0000 | ORAL_TABLET | Freq: Every morning | ORAL | Status: DC

## 2014-01-25 MED ORDER — INSULIN ASPART 100 UNIT/ML ~~LOC~~ SOLN
0.0000 [IU] | Freq: Three times a day (TID) | SUBCUTANEOUS | Status: DC
  Administered 2014-01-25: 11 [IU] via SUBCUTANEOUS
  Administered 2014-01-26: 5 [IU] via SUBCUTANEOUS

## 2014-01-25 MED ORDER — SODIUM CHLORIDE 0.9 % IV SOLN
INTRAVENOUS | Status: DC
  Administered 2014-01-25: 500 mL via INTRAVENOUS

## 2014-01-25 MED ORDER — MIDAZOLAM HCL 2 MG/2ML IJ SOLN
INTRAMUSCULAR | Status: AC
  Filled 2014-01-25: qty 6

## 2014-01-25 MED ORDER — PIPERACILLIN-TAZOBACTAM 3.375 G IVPB
3.3750 g | Freq: Once | INTRAVENOUS | Status: AC
  Administered 2014-01-25: 3.375 g via INTRAVENOUS
  Filled 2014-01-25: qty 50

## 2014-01-25 MED ORDER — ONDANSETRON HCL 4 MG/2ML IJ SOLN
4.0000 mg | Freq: Four times a day (QID) | INTRAMUSCULAR | Status: DC | PRN
  Administered 2014-01-25 – 2014-01-26 (×2): 4 mg via INTRAVENOUS
  Filled 2014-01-25 (×2): qty 2

## 2014-01-25 MED ORDER — IOHEXOL 300 MG/ML  SOLN
80.0000 mL | Freq: Once | INTRAMUSCULAR | Status: AC | PRN
  Administered 2014-01-25: 80 mL via INTRA_ARTERIAL

## 2014-01-25 MED ORDER — PANTOPRAZOLE SODIUM 40 MG PO TBEC
40.0000 mg | DELAYED_RELEASE_TABLET | Freq: Every day | ORAL | Status: DC
  Administered 2014-01-25 – 2014-01-26 (×2): 40 mg via ORAL
  Filled 2014-01-25 (×2): qty 1

## 2014-01-25 MED ORDER — AMLODIPINE BESYLATE 5 MG PO TABS
5.0000 mg | ORAL_TABLET | Freq: Every morning | ORAL | Status: DC
  Administered 2014-01-26: 5 mg via ORAL
  Filled 2014-01-25: qty 1

## 2014-01-25 MED ORDER — INSULIN ASPART 100 UNIT/ML ~~LOC~~ SOLN
3.0000 [IU] | Freq: Three times a day (TID) | SUBCUTANEOUS | Status: DC
  Administered 2014-01-26: 3 [IU] via SUBCUTANEOUS

## 2014-01-25 MED ORDER — INSULIN ASPART 100 UNIT/ML ~~LOC~~ SOLN: 0.0000 [IU] | Freq: Every day | SUBCUTANEOUS | Status: DC

## 2014-01-25 MED ORDER — TRAZODONE HCL 50 MG PO TABS
75.0000 mg | ORAL_TABLET | Freq: Every day | ORAL | Status: DC
  Administered 2014-01-25: 75 mg via ORAL
  Filled 2014-01-25 (×2): qty 1

## 2014-01-25 MED ORDER — SIMVASTATIN 10 MG PO TABS
10.0000 mg | ORAL_TABLET | Freq: Every day | ORAL | Status: DC
Start: 2014-01-25 — End: 2014-01-26
  Administered 2014-01-25: 10 mg via ORAL
  Filled 2014-01-25 (×2): qty 1

## 2014-01-25 MED ORDER — DEXAMETHASONE SODIUM PHOSPHATE 10 MG/ML IJ SOLN
10.0000 mg | Freq: Once | INTRAMUSCULAR | Status: AC
  Administered 2014-01-25: 10 mg via INTRAVENOUS
  Filled 2014-01-25: qty 1

## 2014-01-25 MED ORDER — HYDROCHLOROTHIAZIDE 25 MG PO TABS
25.0000 mg | ORAL_TABLET | Freq: Every day | ORAL | Status: DC
  Administered 2014-01-26: 25 mg via ORAL
  Filled 2014-01-25: qty 1

## 2014-01-25 MED ORDER — ENOXAPARIN SODIUM 40 MG/0.4ML ~~LOC~~ SOLN
40.0000 mg | SUBCUTANEOUS | Status: DC
  Administered 2014-01-25: 40 mg via SUBCUTANEOUS
  Filled 2014-01-25 (×2): qty 0.4

## 2014-01-25 NOTE — Sedation Documentation (Signed)
5 Fr sheath removed from R femoral artery by Dr. Vernard Gambles.  Hemostasis achieved using Exoseal device. R groin level 0, 3+RDP.

## 2014-01-25 NOTE — H&P (Addendum)
Chief Complaint: Hepatocellular carcinoma  Referring Physician(s): Drs. Rehman/Byerly  History of Present Illness: Nicholas Becker is a 58 y.o. male with history of hepatitis C, cirrhosis, elevated AFP and enlarging multifocal liver lesions compatible with [place]. He is s/p CT guided microwave RF ablation of right lobe (segment 5) liver lesion on 01/04/14. The additional segment 2 hepatic lesion is too large for percutaneous ablation and he presents today for DEB-TACE treatment of this lesion.  Past Medical History  Diagnosis Date  . Diabetes mellitus   . Hypertension     normal Bmet in 2012  . Hyperlipemia   . Chronic pancreatitis     calcified  . Tobacco abuse     1/2 pack per day  . Chronic hepatitis C with cirrhosis 2006    Secondary to hepatitis C and ethanol abuse, which is now in remission; varices-status post banding at [facility]; normal LFTs in 2009; thrombocytopenia In the past, platelets-153,000 in 2012  . Cholelithiasis 2009  . Bronchiectasis 2009    Left lower lobe; normal chest x-ray in 2012  . Cataracts, bilateral   . Overweight(278.02)   . 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 Nephrolithiasis 12/31/2010  . Substance abuse     Cocaine, marijuana, alcohol, stopped 10 yrs ago    Past Surgical History  Procedure Laterality Date  . Percutaneous liver biopsy  2006    Allergies: Review of patient's allergies indicates no known allergies.  Medications: Prior to Admission medications   Medication Sig Start Date End Date Taking? Authorizing Provider  amLODipine (NORVASC) 5 MG tablet Take 5 mg by mouth every morning.    Yes Historical Provider, MD  aspirin 81 MG chewable tablet Chew 81 mg by mouth every morning.   Yes Historical Provider, MD  Cholecalciferol (VITAMIN D) 2000 UNITS tablet Take 2,000 Units by mouth every morning.    Yes Historical Provider, MD  citalopram (CELEXA) 40 MG tablet Take 40 mg by mouth every morning.    Yes Historical Provider, MD    docusate sodium 100 MG CAPS Take 100 mg by mouth 2 (two) times daily. 01/05/14  Yes Hedy Jacob, PA-C  insulin detemir (LEVEMIR) 100 UNIT/ML injection Inject 41 Units into the skin every morning.    Yes Historical Provider, MD  insulin lispro (HUMALOG) 100 UNIT/ML injection Inject 5 Units into the skin 3 (three) times daily before meals.   Yes Historical Provider, MD  lisinopril-hydrochlorothiazide (PRINZIDE,ZESTORETIC) 20-12.5 MG per tablet Take 2 tablets by mouth every morning.    Yes Historical Provider, MD  metFORMIN (GLUCOPHAGE) 1000 MG tablet Take 1,000 mg by mouth 2 (two) times daily with a meal.    Yes Historical Provider, MD  Multiple Vitamin (MULTIVITAMIN) tablet Take 1 tablet by mouth every morning.    Yes Historical Provider, MD  nadolol (CORGARD) 80 MG tablet Take 80 mg by mouth every morning.    Yes Historical Provider, MD  Omega-3 Fatty Acids (FISH OIL) 1000 MG CAPS Take 1,000 mg by mouth every morning.    Yes Historical Provider, MD  omeprazole (PRILOSEC) 20 MG capsule Take 20 mg by mouth every morning.    Yes Historical Provider, MD  ondansetron (ZOFRAN) 4 MG tablet Take 1 tablet (4 mg total) by mouth every 8 (eight) hours as needed for nausea or vomiting. 01/05/14  Yes Hedy Jacob, PA-C  simvastatin (ZOCOR) 10 MG tablet Take 10 mg by mouth at bedtime.    Yes Historical Provider, MD  traZODone (DESYREL) 150 MG tablet Take 75 mg by mouth at bedtime.    Yes Historical Provider, MD  oxyCODONE (ROXICODONE) 5 MG immediate release tablet Take 1 tablet (5 mg total) by mouth every 4 (four) hours as needed for severe pain. 01/05/14   Hedy Jacob, PA-C    Family History  Problem Relation Age of Onset  . Healthy Sister   . Lung cancer Brother   . Healthy Sister   . Healthy Sister   . Healthy Sister   . Healthy Brother   . Healthy Brother   . Diabetes Daughter   . Healthy Son     History   Social History  . Marital Status: Divorced    Spouse Name: N/A    Number of  Children: N/A  . Years of Education: N/A   Social History Main Topics  . Smoking status: Current Every Day Smoker -- 0.50 packs/day for 30 years    Types: Cigarettes  . Smokeless tobacco: Never Used     Comment: Patient smokes 1 pack a day  . Alcohol Use: No  . Drug Use: No     Comment: quit coaine alcohol and marijuana 10 years ago  . Sexual Activity: No   Other Topics Concern  . None   Social History Narrative  . None         Review of Systems  Constitutional: Negative for fever, chills and fatigue.  HENT: Positive for congestion.   Respiratory: Negative for cough and shortness of breath.   Cardiovascular: Negative for chest pain.  Gastrointestinal: Negative for nausea, vomiting, abdominal pain and blood in stool.  Genitourinary: Negative for dysuria and hematuria.  Musculoskeletal: Negative for back pain.  Neurological: Negative for headaches.  Hematological: Does not bruise/bleed easily.    Vital Signs: BP 135/74  Pulse 58  Temp(Src) 98.1 F (36.7 C) (Oral)  Resp 16  SpO2 100%  Physical Exam  Constitutional: He is oriented to person, place, and time. He appears well-developed and well-nourished.  Cardiovascular: Normal rate and regular rhythm.   Pulmonary/Chest: Effort normal and breath sounds normal.  Abdominal: Soft. Bowel sounds are normal. He exhibits no distension. There is no tenderness.  Musculoskeletal: Normal range of motion. He exhibits no edema.  Neurological: He is alert and oriented to person, place, and time.    Imaging: Dg Chest 2 View  12/28/2013   CLINICAL DATA:  58 year old male preoperative study for hepatic cellular carcinoma ablation in the setting of chronic hepatitis-C. Initial encounter.  EXAM: CHEST  2 VIEW  COMPARISON:  CT Abdomen and Pelvis 10/23/2012. Chest radiographs 02/17/2008.  FINDINGS: Lung volumes are within normal limits. Mild eventration of the diaphragm. Normal cardiac size and mediastinal contours. Visualized tracheal  air column is within normal limits. Chronic paraseptal emphysema or cavitary lesion in the left lower lobe posterior basal segment. Elsewhere the lungs are clear. No pneumothorax or effusion. No acute osseous abnormality identified.  IMPRESSION: Stable.  No acute or metastatic cardiopulmonary abnormality.   Electronically Signed   By: Lars Pinks M.D.   On: 12/28/2013 11:28   Ct Abd Wo & W Cm  01/05/2014   CLINICAL DATA:  Hepatitis, cirrhosis, enlarging liver lesions. Preop planning for percutaneous ablation.  EXAM: CT ABDOMEN WITHOUT AND WITH CONTRAST  TECHNIQUE: Multidetector CT imaging of the abdomen was performed following the standard protocol before and following the bolus administration of intravenous contrast.  CONTRAST:  100 mL Omnipaque 300 IV  COMPARISON:  MR 10/12/2013 and earlier  studies  FINDINGS: There has been interval enlargement of the two hepatic lesions. The far lateral segment 2 lesion measures 4.4 x 4.5 cm maximum transverse dimensions, previously 2.4 cm. The segment 5 lesion measures 2.6 x 3.7 cm, previously 14 mm. No new enhancing liver lesion identified.  Multiple partially calcified sub sub cm stones layer in the dependent aspect of the nondistended gallbladder. Diffuse pancreatic parenchymal calcifications and pancreatic duct dilatation with stones at the level of the ampulla possibly obstructive.  Unremarkable spleen, adrenal glands, kidneys. Patchy aortic plaque without aneurysm. Patent visceral and renal arteries. Patent portal vein. Patent renal veins. No ascites. No free air. No adenopathy localized.  The large bullae in the posterior basal segment left lower lobe are now nearly completely filled with fluid/debris. There has been some increase in atelectasis/consolidation throughout the visualized dependent aspect of both lower lobes. No pleural effusion. Visualized bones unremarkable.  IMPRESSION: 1. Interval enlargement of both of hepatic lesions since previous imaging of  10/12/2013. The segment 2 lesion is no longer approachable for percutaneous ablation. The segment 5 lesion remains approachable, therefore we will proceed as previously planned with ablation of this lesion. 2. Opacification of left lower lobe bullae suggesting  infection. 3. New pancreatic duct dilatation since previous CT of 10/23/2012, with calculi projecting within the duct near the ampulla. Correlate with any clinical or laboratory evidence of pancreatitis or pancreatic obstruction.   Electronically Signed   By: Arne Cleveland M.D.   On: 01/05/2014 09:37   Ct Guide Tissue Ablation  01/04/2014   : Clinical Data: History of cirrhosis, hepatitis-C. Elevated AFP of with Two enlarging liver lesions on MR , consistent with hepatocellular carcinoma.  CT GUIDED MICROWAVE RF TISSUE ABLATION  Comparison:  CT abdomen performed the same day, and earlier studies  Operators: Mena Pauls  Technique and findings: The procedure, risks (including but not limited to bleeding, infection, organ damage), benefits, and alternatives were explained to the patient. Questions regarding the procedure were encouraged and answered. The patient understands and consents to the procedure.See previous consultation note.  After the patient had been intubated by staff of the Department of Anesthesia, survey axial scanning of the upper abdomen was performed. The lesion was again localized. Site was marked, prepped with Betadine, draped in usual sterile fashion, infiltrated locally with 1% lidocaine. A 22 gauge needle was placed in the scan under CT fluoroscopy. Following this trajectory, a 15 cm NeuWave 17 gauge needle microwave RF ablation needle was advanced of the lesion under intermittent CT fluoroscopy. Parallel 15 cm NeuWave 17 gauge needle was then placed as planned. The lesion was treated with 65 watts at 10 minutes through both needles. Intermittent scanning during ablation confirmed expected microbubble creation around the  tumor. The treatment needles were then removed using cautery setting. The patient was extubated uneventfully and admitted for overnight observation. No immediate complication.  IMPRESSION: 1. Technically successful percutaneous microwave RF ablation of Right lobe liver lesion , under CT guidance.  1.   Electronically Signed   By: Arne Cleveland M.D.   On: 01/04/2014 17:27    Labs: Lab Results  Component Value Date   WBC 6.1 01/25/2014   HCT 38.2* 01/25/2014   MCV 83.8 01/25/2014   PLT 253 01/25/2014   NA 136* 01/25/2014   K 3.9 01/25/2014   CL 98 01/25/2014   CO2 26 01/25/2014   GLUCOSE 161* 01/25/2014   BUN 10 01/25/2014   CREATININE 0.68 01/25/2014   CALCIUM 9.2 01/25/2014  PROT 7.3 01/25/2014   ALBUMIN 3.3* 01/25/2014   AST 20 01/25/2014   ALT 14 01/25/2014   ALKPHOS 129* 01/25/2014   BILITOT 0.5 01/25/2014   GFRNONAA >90 01/25/2014   GFRAA >90 01/25/2014   INR 1.21 01/25/2014   AFPTM 619.4* 10/12/2013    Assessment and Plan: LASHAWN ORREGO is a 58 y.o. male with history of hepatitis C, cirrhosis, elevated AFP and enlarging multifocal liver lesions compatible with [place]. He is s/p CT guided microwave RF ablation of right lobe (segment 5) liver lesion on 01/04/14. The additional segment 2 hepatic lesion is too large for percutaneous ablation and he presents today for DEB-TACE treatment of this lesion. Details/risks of procedure d/w pt with his understanding and consent. Following procedure pt will be monitored overnight in hospital.          I spent a total of 15 minutes face to face in clinical consultation, greater than 50% of which was counseling/coordinating care for hepatic chemoembolization.  Signed: Autumn Messing 01/25/2014, 8:32 AM

## 2014-01-25 NOTE — Procedures (Signed)
Transarterial chemoembol L hepatic artery branches No complication No blood loss. See complete dictation in [facility].

## 2014-01-25 NOTE — Progress Notes (Signed)
Patient ID: Nicholas Becker, male   DOB: 1955-06-22, 58 y.o.   MRN: [id number]      Subjective: pt resting quietly; nausea has resolved; denies abd pain    Allergies: Review of patient's allergies indicates no known allergies.  Medications: Prior to Admission medications   Medication Sig Start Date End Date Taking? Authorizing Provider  amLODipine (NORVASC) 5 MG tablet Take 5 mg by mouth every morning.    Yes Historical Provider, MD  aspirin 81 MG chewable tablet Chew 81 mg by mouth every morning.   Yes Historical Provider, MD  Cholecalciferol (VITAMIN D) 2000 UNITS tablet Take 2,000 Units by mouth every morning.    Yes Historical Provider, MD  citalopram (CELEXA) 40 MG tablet Take 40 mg by mouth every morning.    Yes Historical Provider, MD  docusate sodium 100 MG CAPS Take 100 mg by mouth 2 (two) times daily. 01/05/14  Yes Hedy Jacob, PA-C  insulin detemir (LEVEMIR) 100 UNIT/ML injection Inject 41 Units into the skin every morning.    Yes Historical Provider, MD  insulin lispro (HUMALOG) 100 UNIT/ML injection Inject 5 Units into the skin 3 (three) times daily before meals.   Yes Historical Provider, MD  lisinopril-hydrochlorothiazide (PRINZIDE,ZESTORETIC) 20-12.5 MG per tablet Take 2 tablets by mouth every morning.    Yes Historical Provider, MD  metFORMIN (GLUCOPHAGE) 1000 MG tablet Take 1,000 mg by mouth 2 (two) times daily with a meal.    Yes Historical Provider, MD  Multiple Vitamin (MULTIVITAMIN) tablet Take 1 tablet by mouth every morning.    Yes Historical Provider, MD  nadolol (CORGARD) 80 MG tablet Take 80 mg by mouth every morning.    Yes Historical Provider, MD  Omega-3 Fatty Acids (FISH OIL) 1000 MG CAPS Take 1,000 mg by mouth every morning.    Yes Historical Provider, MD  omeprazole (PRILOSEC) 20 MG capsule Take 20 mg by mouth every morning.    Yes Historical Provider, MD  ondansetron (ZOFRAN) 4 MG tablet Take 1 tablet (4 mg total) by mouth every 8 (eight) hours as  needed for nausea or vomiting. 01/05/14  Yes Hedy Jacob, PA-C  simvastatin (ZOCOR) 10 MG tablet Take 10 mg by mouth at bedtime.    Yes Historical Provider, MD  traZODone (DESYREL) 150 MG tablet Take 75 mg by mouth at bedtime.    Yes Historical Provider, MD  oxyCODONE (ROXICODONE) 5 MG immediate release tablet Take 1 tablet (5 mg total) by mouth every 4 (four) hours as needed for severe pain. 01/05/14   Hedy Jacob, PA-C    Review of Systems see above  Vital Signs: BP 156/86  Pulse 62  Temp(Src) 98.7 F (37.1 C) (Oral)  Resp 18  SpO2 99%  Physical Exam pt awake.alert; chest- CTA bilat; heart- RRR; abd- soft +BS,NT/ND; puncture site rt CFA clean and dry,NT/ND,no hematoma; intact distal pulses  Imaging: Ir Angiogram Visceral Selective  01/25/2014   CLINICAL DATA:  History of cirrhosis, hepatitis-C. Multifocal hepatoma. The smaller segment 5 of lesion was treated with percutaneous ablation. He presents for chemoembolization of the larger segment 2 lesion with drug-eluting beads.  FLUOROSCOPY TIME:  30 min 0 seconds  TECHNIQUE: ULTRASOUND GUIDANCE FOR VASCULAR ACCESS  SELECTIVE LEFT HEPATIC ARTERY PERIPHERAL CATHETERIZATION x2  PRETREATMENT ANGIOGRAM  TRANS ARTERIAL CHEMOEMBOLIZATION POSTEMBOLIZATION ANGIOGRAMS  Technique and findings: Informed consent was obtained from the patient following explanation of the procedure, risks, benefits and alternatives. The patient understands, agrees and consents for the procedure. All questions  were addressed. A time out was performed.As antibiotic prophylaxis, 3.375 g Zosyn IV was ordered pre-procedure and administered intravenously within one hour of incision. Maximal barrier sterile technique utilized including caps, mask, sterile gowns, sterile gloves, large sterile drape, hand hygiene, and betadine.  Intravenous Fentanyl and Versed were administered as conscious sedation during continuous cardiorespiratory monitoring by the radiology RN, with a total  moderate sedation time of seventy-eight minutes.  Under sterile conditions and local anesthesia, 21 gauge single wall ultrasound access performed of the right common femoral artery. Needle exchanged over a 018 wire for transitional dilator, which allowed advancement of a 5 [place] Bentson wire.  Over the [facility] guide wire, 5-French sheath was inserted. A 5- French C2 catheter was utilized to select the celiac origin. Selective arteriography was performed. This demonstrated classic celiac trifurcation anatomy. No significant left hepatic arterial supply from the left gastric artery. The catheter was advanced into the common hepatic artery. A Renegade high-flow micro catheter was advanced over a 0.018 guidewire into the proper hepatic artery. Selective hepatic pretreatment angiogram was performed. This confirmed good antegrade flow into distal hepatic branches. The 2 dominant lateral left hepatic segment branches were identified.  The micro catheter was advanced into the posterior left hepatic arterial branch. Confirmatory arteriography was performed. From this location, the doxorubicin enhanced LCB beads in dilute contrast were slowly administered until there was near stasis of flow. The micro catheter was flushed and partially withdrawn. Follow-up arteriography demonstrates no further significant flow through this branch into the the lateral left hepatic segment.  In similar fashion, the micro catheter was advanced into anterior left hepatic arterial branch. Confirmatory arteriography was performed. From this location, additional it doxorubicin enhanced LCB beads in dilute contrast were slowly administered until there was near stasis of flow. The micro catheter was flushed and partially withdrawn. Follow-up arteriography demonstrates no further significant flow through this branch into the the lateral left hepatic segment.  The micro catheter and a 5-F catheter were removed safely.  After confirmatory femoral  arteriography, Hemostasis was obtained at the right common femoral artery access site with an Exoseal device. No immediate complication. Distal pedal pulses remained normal. The patient tolerated the treatment well.  IMPRESSION: 1. Successful subselective left hepatic artery drug-eluting bead transarterial chemoembolization for hepatocellular carcinoma.   Electronically Signed   By: Arne Cleveland M.D.   On: 01/25/2014 17:29   Ir Angiogram Selective Each Additional Vessel  01/25/2014   CLINICAL DATA:  History of cirrhosis, hepatitis-C. Multifocal hepatoma. The smaller segment 5 of lesion was treated with percutaneous ablation. He presents for chemoembolization of the larger segment 2 lesion with drug-eluting beads.  FLUOROSCOPY TIME:  30 min 0 seconds  TECHNIQUE: ULTRASOUND GUIDANCE FOR VASCULAR ACCESS  SELECTIVE LEFT HEPATIC ARTERY PERIPHERAL CATHETERIZATION x2  PRETREATMENT ANGIOGRAM  TRANS ARTERIAL CHEMOEMBOLIZATION POSTEMBOLIZATION ANGIOGRAMS  Technique and findings: Informed consent was obtained from the patient following explanation of the procedure, risks, benefits and alternatives. The patient understands, agrees and consents for the procedure. All questions were addressed. A time out was performed.As antibiotic prophylaxis, 3.375 g Zosyn IV was ordered pre-procedure and administered intravenously within one hour of incision. Maximal barrier sterile technique utilized including caps, mask, sterile gowns, sterile gloves, large sterile drape, hand hygiene, and betadine.  Intravenous Fentanyl and Versed were administered as conscious sedation during continuous cardiorespiratory monitoring by the radiology RN, with a total moderate sedation time of seventy-eight minutes.  Under sterile conditions and local anesthesia, 21 gauge single wall ultrasound access performed  of the right common femoral artery. Needle exchanged over a 018 wire for transitional dilator, which allowed advancement of a 5 [place] Bentson  wire.  Over the [facility] guide wire, 5-French sheath was inserted. A 5- French C2 catheter was utilized to select the celiac origin. Selective arteriography was performed. This demonstrated classic celiac trifurcation anatomy. No significant left hepatic arterial supply from the left gastric artery. The catheter was advanced into the common hepatic artery. A Renegade high-flow micro catheter was advanced over a 0.018 guidewire into the proper hepatic artery. Selective hepatic pretreatment angiogram was performed. This confirmed good antegrade flow into distal hepatic branches. The 2 dominant lateral left hepatic segment branches were identified.  The micro catheter was advanced into the posterior left hepatic arterial branch. Confirmatory arteriography was performed. From this location, the doxorubicin enhanced LCB beads in dilute contrast were slowly administered until there was near stasis of flow. The micro catheter was flushed and partially withdrawn. Follow-up arteriography demonstrates no further significant flow through this branch into the the lateral left hepatic segment.  In similar fashion, the micro catheter was advanced into anterior left hepatic arterial branch. Confirmatory arteriography was performed. From this location, additional it doxorubicin enhanced LCB beads in dilute contrast were slowly administered until there was near stasis of flow. The micro catheter was flushed and partially withdrawn. Follow-up arteriography demonstrates no further significant flow through this branch into the the lateral left hepatic segment.  The micro catheter and a 5-F catheter were removed safely.  After confirmatory femoral arteriography, Hemostasis was obtained at the right common femoral artery access site with an Exoseal device. No immediate complication. Distal pedal pulses remained normal. The patient tolerated the treatment well.  IMPRESSION: 1. Successful subselective left hepatic artery drug-eluting bead  transarterial chemoembolization for hepatocellular carcinoma.   Electronically Signed   By: Arne Cleveland M.D.   On: 01/25/2014 17:29   Ir Angiogram Selective Each Additional Vessel  01/25/2014   CLINICAL DATA:  History of cirrhosis, hepatitis-C. Multifocal hepatoma. The smaller segment 5 of lesion was treated with percutaneous ablation. He presents for chemoembolization of the larger segment 2 lesion with drug-eluting beads.  FLUOROSCOPY TIME:  30 min 0 seconds  TECHNIQUE: ULTRASOUND GUIDANCE FOR VASCULAR ACCESS  SELECTIVE LEFT HEPATIC ARTERY PERIPHERAL CATHETERIZATION x2  PRETREATMENT ANGIOGRAM  TRANS ARTERIAL CHEMOEMBOLIZATION POSTEMBOLIZATION ANGIOGRAMS  Technique and findings: Informed consent was obtained from the patient following explanation of the procedure, risks, benefits and alternatives. The patient understands, agrees and consents for the procedure. All questions were addressed. A time out was performed.As antibiotic prophylaxis, 3.375 g Zosyn IV was ordered pre-procedure and administered intravenously within one hour of incision. Maximal barrier sterile technique utilized including caps, mask, sterile gowns, sterile gloves, large sterile drape, hand hygiene, and betadine.  Intravenous Fentanyl and Versed were administered as conscious sedation during continuous cardiorespiratory monitoring by the radiology RN, with a total moderate sedation time of seventy-eight minutes.  Under sterile conditions and local anesthesia, 21 gauge single wall ultrasound access performed of the right common femoral artery. Needle exchanged over a 018 wire for transitional dilator, which allowed advancement of a 5 [place] Bentson wire.  Over the [facility] guide wire, 5-French sheath was inserted. A 5- French C2 catheter was utilized to select the celiac origin. Selective arteriography was performed. This demonstrated classic celiac trifurcation anatomy. No significant left hepatic arterial supply from the left gastric  artery. The catheter was advanced into the common hepatic artery. A Renegade high-flow micro catheter was advanced  over a 0.018 guidewire into the proper hepatic artery. Selective hepatic pretreatment angiogram was performed. This confirmed good antegrade flow into distal hepatic branches. The 2 dominant lateral left hepatic segment branches were identified.  The micro catheter was advanced into the posterior left hepatic arterial branch. Confirmatory arteriography was performed. From this location, the doxorubicin enhanced LCB beads in dilute contrast were slowly administered until there was near stasis of flow. The micro catheter was flushed and partially withdrawn. Follow-up arteriography demonstrates no further significant flow through this branch into the the lateral left hepatic segment.  In similar fashion, the micro catheter was advanced into anterior left hepatic arterial branch. Confirmatory arteriography was performed. From this location, additional it doxorubicin enhanced LCB beads in dilute contrast were slowly administered until there was near stasis of flow. The micro catheter was flushed and partially withdrawn. Follow-up arteriography demonstrates no further significant flow through this branch into the the lateral left hepatic segment.  The micro catheter and a 5-F catheter were removed safely.  After confirmatory femoral arteriography, Hemostasis was obtained at the right common femoral artery access site with an Exoseal device. No immediate complication. Distal pedal pulses remained normal. The patient tolerated the treatment well.  IMPRESSION: 1. Successful subselective left hepatic artery drug-eluting bead transarterial chemoembolization for hepatocellular carcinoma.   Electronically Signed   By: Arne Cleveland M.D.   On: 01/25/2014 17:29   Ir Angiogram Selective Each Additional Vessel  01/25/2014   CLINICAL DATA:  History of cirrhosis, hepatitis-C. Multifocal hepatoma. The smaller segment  5 of lesion was treated with percutaneous ablation. He presents for chemoembolization of the larger segment 2 lesion with drug-eluting beads.  FLUOROSCOPY TIME:  30 min 0 seconds  TECHNIQUE: ULTRASOUND GUIDANCE FOR VASCULAR ACCESS  SELECTIVE LEFT HEPATIC ARTERY PERIPHERAL CATHETERIZATION x2  PRETREATMENT ANGIOGRAM  TRANS ARTERIAL CHEMOEMBOLIZATION POSTEMBOLIZATION ANGIOGRAMS  Technique and findings: Informed consent was obtained from the patient following explanation of the procedure, risks, benefits and alternatives. The patient understands, agrees and consents for the procedure. All questions were addressed. A time out was performed.As antibiotic prophylaxis, 3.375 g Zosyn IV was ordered pre-procedure and administered intravenously within one hour of incision. Maximal barrier sterile technique utilized including caps, mask, sterile gowns, sterile gloves, large sterile drape, hand hygiene, and betadine.  Intravenous Fentanyl and Versed were administered as conscious sedation during continuous cardiorespiratory monitoring by the radiology RN, with a total moderate sedation time of seventy-eight minutes.  Under sterile conditions and local anesthesia, 21 gauge single wall ultrasound access performed of the right common femoral artery. Needle exchanged over a 018 wire for transitional dilator, which allowed advancement of a 5 [place] Bentson wire.  Over the [facility] guide wire, 5-French sheath was inserted. A 5- French C2 catheter was utilized to select the celiac origin. Selective arteriography was performed. This demonstrated classic celiac trifurcation anatomy. No significant left hepatic arterial supply from the left gastric artery. The catheter was advanced into the common hepatic artery. A Renegade high-flow micro catheter was advanced over a 0.018 guidewire into the proper hepatic artery. Selective hepatic pretreatment angiogram was performed. This confirmed good antegrade flow into distal hepatic branches. The 2  dominant lateral left hepatic segment branches were identified.  The micro catheter was advanced into the posterior left hepatic arterial branch. Confirmatory arteriography was performed. From this location, the doxorubicin enhanced LCB beads in dilute contrast were slowly administered until there was near stasis of flow. The micro catheter was flushed and partially withdrawn. Follow-up arteriography demonstrates no  further significant flow through this branch into the the lateral left hepatic segment.  In similar fashion, the micro catheter was advanced into anterior left hepatic arterial branch. Confirmatory arteriography was performed. From this location, additional it doxorubicin enhanced LCB beads in dilute contrast were slowly administered until there was near stasis of flow. The micro catheter was flushed and partially withdrawn. Follow-up arteriography demonstrates no further significant flow through this branch into the the lateral left hepatic segment.  The micro catheter and a 5-F catheter were removed safely.  After confirmatory femoral arteriography, Hemostasis was obtained at the right common femoral artery access site with an Exoseal device. No immediate complication. Distal pedal pulses remained normal. The patient tolerated the treatment well.  IMPRESSION: 1. Successful subselective left hepatic artery drug-eluting bead transarterial chemoembolization for hepatocellular carcinoma.   Electronically Signed   By: Arne Cleveland M.D.   On: 01/25/2014 17:29   Ir US Guide Vasc Access Right  01/25/2014   CLINICAL DATA:  History of cirrhosis, hepatitis-C. Multifocal hepatoma. The smaller segment 5 of lesion was treated with percutaneous ablation. He presents for chemoembolization of the larger segment 2 lesion with drug-eluting beads.  FLUOROSCOPY TIME:  30 min 0 seconds  TECHNIQUE: ULTRASOUND GUIDANCE FOR VASCULAR ACCESS  SELECTIVE LEFT HEPATIC ARTERY PERIPHERAL CATHETERIZATION x2  PRETREATMENT  ANGIOGRAM  TRANS ARTERIAL CHEMOEMBOLIZATION POSTEMBOLIZATION ANGIOGRAMS  Technique and findings: Informed consent was obtained from the patient following explanation of the procedure, risks, benefits and alternatives. The patient understands, agrees and consents for the procedure. All questions were addressed. A time out was performed.As antibiotic prophylaxis, 3.375 g Zosyn IV was ordered pre-procedure and administered intravenously within one hour of incision. Maximal barrier sterile technique utilized including caps, mask, sterile gowns, sterile gloves, large sterile drape, hand hygiene, and betadine.  Intravenous Fentanyl and Versed were administered as conscious sedation during continuous cardiorespiratory monitoring by the radiology RN, with a total moderate sedation time of seventy-eight minutes.  Under sterile conditions and local anesthesia, 21 gauge single wall ultrasound access performed of the right common femoral artery. Needle exchanged over a 018 wire for transitional dilator, which allowed advancement of a 5 [place] Bentson wire.  Over the [facility] guide wire, 5-French sheath was inserted. A 5- French C2 catheter was utilized to select the celiac origin. Selective arteriography was performed. This demonstrated classic celiac trifurcation anatomy. No significant left hepatic arterial supply from the left gastric artery. The catheter was advanced into the common hepatic artery. A Renegade high-flow micro catheter was advanced over a 0.018 guidewire into the proper hepatic artery. Selective hepatic pretreatment angiogram was performed. This confirmed good antegrade flow into distal hepatic branches. The 2 dominant lateral left hepatic segment branches were identified.  The micro catheter was advanced into the posterior left hepatic arterial branch. Confirmatory arteriography was performed. From this location, the doxorubicin enhanced LCB beads in dilute contrast were slowly administered until there was  near stasis of flow. The micro catheter was flushed and partially withdrawn. Follow-up arteriography demonstrates no further significant flow through this branch into the the lateral left hepatic segment.  In similar fashion, the micro catheter was advanced into anterior left hepatic arterial branch. Confirmatory arteriography was performed. From this location, additional it doxorubicin enhanced LCB beads in dilute contrast were slowly administered until there was near stasis of flow. The micro catheter was flushed and partially withdrawn. Follow-up arteriography demonstrates no further significant flow through this branch into the the lateral left hepatic segment.  The micro catheter and  a 5-F catheter were removed safely.  After confirmatory femoral arteriography, Hemostasis was obtained at the right common femoral artery access site with an Exoseal device. No immediate complication. Distal pedal pulses remained normal. The patient tolerated the treatment well.  IMPRESSION: 1. Successful subselective left hepatic artery drug-eluting bead transarterial chemoembolization for hepatocellular carcinoma.   Electronically Signed   By: Arne Cleveland M.D.   On: 01/25/2014 17:29   Ir Embo Arterial Not [place]  01/25/2014   CLINICAL DATA:  History of cirrhosis, hepatitis-C. Multifocal hepatoma. The smaller segment 5 of lesion was treated with percutaneous ablation. He presents for chemoembolization of the larger segment 2 lesion with drug-eluting beads.  FLUOROSCOPY TIME:  30 min 0 seconds  TECHNIQUE: ULTRASOUND GUIDANCE FOR VASCULAR ACCESS  SELECTIVE LEFT HEPATIC ARTERY PERIPHERAL CATHETERIZATION x2  PRETREATMENT ANGIOGRAM  TRANS ARTERIAL CHEMOEMBOLIZATION POSTEMBOLIZATION ANGIOGRAMS  Technique and findings: Informed consent was obtained from the patient following explanation of the procedure, risks, benefits and alternatives. The patient understands, agrees and consents for the procedure.  All questions were addressed. A time out was performed.As antibiotic prophylaxis, 3.375 g Zosyn IV was ordered pre-procedure and administered intravenously within one hour of incision. Maximal barrier sterile technique utilized including caps, mask, sterile gowns, sterile gloves, large sterile drape, hand hygiene, and betadine.  Intravenous Fentanyl and Versed were administered as conscious sedation during continuous cardiorespiratory monitoring by the radiology RN, with a total moderate sedation time of seventy-eight minutes.  Under sterile conditions and local anesthesia, 21 gauge single wall ultrasound access performed of the right common femoral artery. Needle exchanged over a 018 wire for transitional dilator, which allowed advancement of a 5 [place] Bentson wire.  Over the [facility] guide wire, 5-French sheath was inserted. A 5- French C2 catheter was utilized to select the celiac origin. Selective arteriography was performed. This demonstrated classic celiac trifurcation anatomy. No significant left hepatic arterial supply from the left gastric artery. The catheter was advanced into the common hepatic artery. A Renegade high-flow micro catheter was advanced over a 0.018 guidewire into the proper hepatic artery. Selective hepatic pretreatment angiogram was performed. This confirmed good antegrade flow into distal hepatic branches. The 2 dominant lateral left hepatic segment branches were identified.  The micro catheter was advanced into the posterior left hepatic arterial branch. Confirmatory arteriography was performed. From this location, the doxorubicin enhanced LCB beads in dilute contrast were slowly administered until there was near stasis of flow. The micro catheter was flushed and partially withdrawn. Follow-up arteriography demonstrates no further significant flow through this branch into the the lateral left hepatic segment.  In similar fashion, the micro catheter was advanced into anterior left hepatic  arterial branch. Confirmatory arteriography was performed. From this location, additional it doxorubicin enhanced LCB beads in dilute contrast were slowly administered until there was near stasis of flow. The micro catheter was flushed and partially withdrawn. Follow-up arteriography demonstrates no further significant flow through this branch into the the lateral left hepatic segment.  The micro catheter and a 5-F catheter were removed safely.  After confirmatory femoral arteriography, Hemostasis was obtained at the right common femoral artery access site with an Exoseal device. No immediate complication. Distal pedal pulses remained normal. The patient tolerated the treatment well.  IMPRESSION: 1. Successful subselective left hepatic artery drug-eluting bead transarterial chemoembolization for hepatocellular carcinoma.   Electronically Signed   By: Arne Cleveland M.D.   On: 01/25/2014 17:29    Labs:  CBC:  Recent Labs  06/25/13 1304 12/28/13 1055 01/05/14 0405 01/25/14 0755  WBC 6.7 5.8 5.6 6.1  HGB 16.4 14.4 14.0 13.0  HCT 45.7 41.3 40.8 38.2*  PLT 155 202 139* 253    COAGS:  Recent Labs  06/25/13 1304 12/28/13 1055 01/25/14 0755  INR 1.10 1.12 1.21  APTT 32 33  --     BMP:  Recent Labs  08/28/13 1150 12/28/13 1055 01/05/14 0405 01/25/14 0755  NA 140 139 134* 136*  K 4.2 4.1 3.4* 3.9  CL 100 98 95* 98  CO2 28 24 26 26   GLUCOSE 145* 157* 186* 161*  BUN 9 11 6 10   CALCIUM 9.3 9.9 8.8 9.2  CREATININE 0.90 0.75 0.72 0.68  GFRNONAA  --  >90 >90 >90  GFRAA  --  >90 >90 >90    LIVER FUNCTION TESTS:  Recent Labs  08/28/13 1150 12/28/13 1055 01/05/14 0405 01/25/14 0755  BILITOT 0.4 0.4 1.0 0.5  AST 19 27 354* 20  ALT 11 13 155* 14  ALKPHOS 62 89 88 129*  PROT 6.5 7.8 6.8 7.3  ALBUMIN 4.1 4.0 3.6 3.3*    Assessment and Plan:  Pt with HCC; s/p DEB-TACE segment 2 liver lesion 10/2; for overnight obs; f/u CT /IR clinic visit in 1  month       Signed: Sarahi Borland,D [facility] 01/25/2014, 6:23 PM

## 2014-01-26 ENCOUNTER — Other Ambulatory Visit: Payer: Self-pay | Admitting: Radiology

## 2014-01-26 DIAGNOSIS — C220 Liver cell carcinoma: Secondary | ICD-10-CM

## 2014-01-26 LAB — COMPREHENSIVE METABOLIC PANEL
ALBUMIN: 3 g/dL — AB (ref 3.5–5.2)
ALK PHOS: 126 U/L — AB (ref 39–117)
ALT: 34 U/L (ref 0–53)
AST: 62 U/L — ABNORMAL HIGH (ref 0–37)
Anion gap: 13 (ref 5–15)
BUN: 9 mg/dL (ref 6–23)
CHLORIDE: 97 meq/L (ref 96–112)
CO2: 24 mEq/L (ref 19–32)
Calcium: 8.4 mg/dL (ref 8.4–10.5)
Creatinine, Ser: 0.6 mg/dL (ref 0.50–1.35)
GFR calc non Af Amer: >90 mL/min (ref >90)
GLUCOSE: 253 mg/dL — AB (ref 70–99)
POTASSIUM: 3.8 meq/L (ref 3.7–5.3)
Sodium: 134 mEq/L — ABNORMAL LOW (ref 137–147)
Total Bilirubin: 0.4 mg/dL (ref 0.3–1.2)
Total Protein: 7 g/dL (ref 6.0–8.3)

## 2014-01-26 LAB — CBC
HEMATOCRIT: 38.1 % — AB (ref 39.0–52.0)
HEMOGLOBIN: 13.3 g/dL (ref 13.0–17.0)
MCH: 28.3 pg (ref 26.0–34.0)
MCHC: 34.9 g/dL (ref 30.0–36.0)
MCV: 81.1 fL (ref 78.0–100.0)
Platelets: 262 10*3/uL (ref 150–400)
RBC: 4.7 MIL/uL (ref 4.22–5.81)
RDW: 14.9 % (ref 11.5–15.5)
WBC: 7.8 10*3/uL (ref 4.0–10.5)

## 2014-01-26 LAB — GLUCOSE, CAPILLARY: GLUCOSE-CAPILLARY: 227 mg/dL — AB (ref 70–99)

## 2014-01-26 MED ORDER — PROCHLORPERAZINE EDISYLATE 5 MG/ML IJ SOLN: 10.0000 mg | Freq: Four times a day (QID) | INTRAMUSCULAR | Status: DC | PRN

## 2014-01-26 NOTE — Progress Notes (Signed)
Pt discharged to home.  Reviewed discharge instructions and medications with patient and patient verbalized understanding with no further questions.  Zacharee Gaddie B, RN  

## 2014-01-26 NOTE — Discharge Summary (Signed)
Patient ID: Nicholas Becker MRN: [id number] DOB/AGE: 56-Dec-1957 56 y.o.  Admit date: 01/25/2014 Discharge date: 01/26/2014  Admission Diagnoses: Hepatocellular carcinoma  Discharge Diagnoses: Hepatocellular carcinoma, status post successful subselective left hepatic artery drug-eluting bead transarterial chemoembolization on 01/25/2014 Active Problems:   Hepatoma  Past Medical History  Diagnosis Date  . Diabetes mellitus   . Hypertension     normal Bmet in 2012  . Hyperlipemia   . Chronic pancreatitis     calcified  . Tobacco abuse     1/2 pack per day  . Chronic hepatitis C with cirrhosis 2006    Secondary to hepatitis C and ethanol abuse, which is now in remission; varices-status post banding at [facility]; normal LFTs in 2009; thrombocytopenia In the past, platelets-153,000 in 2012  . Cholelithiasis 2009  . Bronchiectasis 2009    Left lower lobe; normal chest x-ray in 2012  . Cataracts, bilateral   . Overweight(278.02)   . 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 Nephrolithiasis 12/31/2010  . Substance abuse     Cocaine, marijuana, alcohol, stopped 10 yrs ago   Past Surgical History  Procedure Laterality Date  . Percutaneous liver biopsy  2006      Discharged Condition: good  Hospital Course: Mr. Vetsch is a 58 year old black male, patient of Drs. Rehman and Byerly,  who was previously referred to the interventional radiology service for treatment options regarding enlarging multifocal liver lesions compatible with hepatocellular carcinoma. Following consultation the patient underwent CT-guided microwave radiofrequency ablation of a segment 5 right lobe liver lesion on 01/04/2014. An additional segment 2 liver lesion was too large for percutaneous ablation at that time. The patient presented on 01/25/2014 for elective drug-eluting bead (doxorubicin)/transarterial chemoembolization of this left hepatic lobe lesion. The procedure was performed via conscious sedation and  without immediate complications. Postprocedure the patient was admitted to the hospital for overnight observation . Overnight the patient did well with no significant complaints. He was able to tolerate his diet, ambulate and void without difficulty. The patient was seen by Dr. Vernard Gambles and deemed stable for discharge at this time. He will undergo follow up CT and IR clinic visit with Dr. Vernard Gambles in one month. He'll continue his current home medications and  follow up with Drs. Rehman and [place] as needed. The patient was told to contact our service with any additional questions or concerns.  Consults: none  Significant Diagnostic Studies:  Results for orders placed during the hospital encounter of 01/25/14  CBC WITH DIFFERENTIAL      Result Value Ref Range   WBC 6.1  4.0 - 10.5 K/uL   RBC 4.56  4.22 - 5.81 MIL/uL   Hemoglobin 13.0  13.0 - 17.0 g/dL   HCT 38.2 (*) 39.0 - 52.0 %   MCV 83.8  78.0 - 100.0 fL   MCH 28.5  26.0 - 34.0 pg   MCHC 34.0  30.0 - 36.0 g/dL   RDW 15.6 (*) 11.5 - 15.5 %   Platelets 253  150 - 400 K/uL   Neutrophils Relative % 58  43 - 77 %   Neutro Abs 3.5  1.7 - 7.7 K/uL   Lymphocytes Relative 30  12 - 46 %   Lymphs Abs 1.8  0.7 - 4.0 K/uL   Monocytes Relative 9  3 - 12 %   Monocytes Absolute 0.6  0.1 - 1.0 K/uL   Eosinophils Relative 2  0 - 5 %   Eosinophils  Absolute 0.1  0.0 - 0.7 K/uL   Basophils Relative 1  0 - 1 %   Basophils Absolute 0.0  0.0 - 0.1 K/uL  COMPREHENSIVE METABOLIC PANEL      Result Value Ref Range   Sodium 136 (*) 137 - 147 mEq/L   Potassium 3.9  3.7 - 5.3 mEq/L   Chloride 98  96 - 112 mEq/L   CO2 26  19 - 32 mEq/L   Glucose, Bld 161 (*) 70 - 99 mg/dL   BUN 10  6 - 23 mg/dL   Creatinine, Ser 0.68  0.50 - 1.35 mg/dL   Calcium 9.2  8.4 - 10.5 mg/dL   Total Protein 7.3  6.0 - 8.3 g/dL   Albumin 3.3 (*) 3.5 - 5.2 g/dL   AST 20  0 - 37 U/L   ALT 14  0 - 53 U/L   Alkaline Phosphatase 129 (*) 39 - 117 U/L   Total Bilirubin 0.5  0.3 - 1.2  mg/dL   GFR calc non Af Amer >90  >90 mL/min   GFR calc Af Amer >90  >90 mL/min   Anion gap 12  5 - 15  PROTIME-INR      Result Value Ref Range   Prothrombin Time 15.4 (*) 11.6 - 15.2 seconds   INR 1.21  0.00 - 1.49  GLUCOSE, CAPILLARY      Result Value Ref Range   Glucose-Capillary 179 (*) 70 - 99 mg/dL  GLUCOSE, CAPILLARY      Result Value Ref Range   Glucose-Capillary 316 (*) 70 - 99 mg/dL   Comment 1 Documented in Chart     Comment 2 Notify RN    CBC      Result Value Ref Range   WBC 7.8  4.0 - 10.5 K/uL   RBC 4.70  4.22 - 5.81 MIL/uL   Hemoglobin 13.3  13.0 - 17.0 g/dL   HCT 38.1 (*) 39.0 - 52.0 %   MCV 81.1  78.0 - 100.0 fL   MCH 28.3  26.0 - 34.0 pg   MCHC 34.9  30.0 - 36.0 g/dL   RDW 14.9  11.5 - 15.5 %   Platelets 262  150 - 400 K/uL  COMPREHENSIVE METABOLIC PANEL      Result Value Ref Range   Sodium 134 (*) 137 - 147 mEq/L   Potassium 3.8  3.7 - 5.3 mEq/L   Chloride 97  96 - 112 mEq/L   CO2 24  19 - 32 mEq/L   Glucose, Bld 253 (*) 70 - 99 mg/dL   BUN 9  6 - 23 mg/dL   Creatinine, Ser 0.60  0.50 - 1.35 mg/dL   Calcium 8.4  8.4 - 10.5 mg/dL   Total Protein 7.0  6.0 - 8.3 g/dL   Albumin 3.0 (*) 3.5 - 5.2 g/dL   AST 62 (*) 0 - 37 U/L   ALT 34  0 - 53 U/L   Alkaline Phosphatase 126 (*) 39 - 117 U/L   Total Bilirubin 0.4  0.3 - 1.2 mg/dL   GFR calc non Af Amer >90  >90 mL/min   GFR calc Af Amer >90  >90 mL/min   Anion gap 13  5 - 15  GLUCOSE, CAPILLARY      Result Value Ref Range   Glucose-Capillary 241 (*) 70 - 99 mg/dL   Comment 1 Notify RN    GLUCOSE, CAPILLARY      Result Value Ref Range   Glucose-Capillary  227 (*) 70 - 99 mg/dL     Treatments: Dg Chest 2 View  12/28/2013   CLINICAL DATA:  58 year old male preoperative study for hepatic cellular carcinoma ablation in the setting of chronic hepatitis-C. Initial encounter.  EXAM: CHEST  2 VIEW  COMPARISON:  CT Abdomen and Pelvis 10/23/2012. Chest radiographs 02/17/2008.  FINDINGS: Lung volumes are within  normal limits. Mild eventration of the diaphragm. Normal cardiac size and mediastinal contours. Visualized tracheal air column is within normal limits. Chronic paraseptal emphysema or cavitary lesion in the left lower lobe posterior basal segment. Elsewhere the lungs are clear. No pneumothorax or effusion. No acute osseous abnormality identified.  IMPRESSION: Stable.  No acute or metastatic cardiopulmonary abnormality.   Electronically Signed   By: Lars Pinks M.D.   On: 12/28/2013 11:28   Ir Angiogram Visceral Selective  01/25/2014   CLINICAL DATA:  History of cirrhosis, hepatitis-C. Multifocal hepatoma. The smaller segment 5 of lesion was treated with percutaneous ablation. He presents for chemoembolization of the larger segment 2 lesion with drug-eluting beads.  FLUOROSCOPY TIME:  30 min 0 seconds  TECHNIQUE: ULTRASOUND GUIDANCE FOR VASCULAR ACCESS  SELECTIVE LEFT HEPATIC ARTERY PERIPHERAL CATHETERIZATION x2  PRETREATMENT ANGIOGRAM  TRANS ARTERIAL CHEMOEMBOLIZATION POSTEMBOLIZATION ANGIOGRAMS  Technique and findings: Informed consent was obtained from the patient following explanation of the procedure, risks, benefits and alternatives. The patient understands, agrees and consents for the procedure. All questions were addressed. A time out was performed.As antibiotic prophylaxis, 3.375 g Zosyn IV was ordered pre-procedure and administered intravenously within one hour of incision. Maximal barrier sterile technique utilized including caps, mask, sterile gowns, sterile gloves, large sterile drape, hand hygiene, and betadine.  Intravenous Fentanyl and Versed were administered as conscious sedation during continuous cardiorespiratory monitoring by the radiology RN, with a total moderate sedation time of seventy-eight minutes.  Under sterile conditions and local anesthesia, 21 gauge single wall ultrasound access performed of the right common femoral artery. Needle exchanged over a 018 wire for transitional dilator,  which allowed advancement of a 5 [place] Bentson wire.  Over the [facility] guide wire, 5-French sheath was inserted. A 5- French C2 catheter was utilized to select the celiac origin. Selective arteriography was performed. This demonstrated classic celiac trifurcation anatomy. No significant left hepatic arterial supply from the left gastric artery. The catheter was advanced into the common hepatic artery. A Renegade high-flow micro catheter was advanced over a 0.018 guidewire into the proper hepatic artery. Selective hepatic pretreatment angiogram was performed. This confirmed good antegrade flow into distal hepatic branches. The 2 dominant lateral left hepatic segment branches were identified.  The micro catheter was advanced into the posterior left hepatic arterial branch. Confirmatory arteriography was performed. From this location, the doxorubicin enhanced LCB beads in dilute contrast were slowly administered until there was near stasis of flow. The micro catheter was flushed and partially withdrawn. Follow-up arteriography demonstrates no further significant flow through this branch into the the lateral left hepatic segment.  In similar fashion, the micro catheter was advanced into anterior left hepatic arterial branch. Confirmatory arteriography was performed. From this location, additional it doxorubicin enhanced LCB beads in dilute contrast were slowly administered until there was near stasis of flow. The micro catheter was flushed and partially withdrawn. Follow-up arteriography demonstrates no further significant flow through this branch into the the lateral left hepatic segment.  The micro catheter and a 5-F catheter were removed safely.  After confirmatory femoral arteriography, Hemostasis was obtained at the right common femoral artery  access site with an Exoseal device. No immediate complication. Distal pedal pulses remained normal. The patient tolerated the treatment well.  IMPRESSION: 1. Successful  subselective left hepatic artery drug-eluting bead transarterial chemoembolization for hepatocellular carcinoma.   Electronically Signed   By: Arne Cleveland M.D.   On: 01/25/2014 17:29   Ir Angiogram Selective Each Additional Vessel  01/25/2014   CLINICAL DATA:  History of cirrhosis, hepatitis-C. Multifocal hepatoma. The smaller segment 5 of lesion was treated with percutaneous ablation. He presents for chemoembolization of the larger segment 2 lesion with drug-eluting beads.  FLUOROSCOPY TIME:  30 min 0 seconds  TECHNIQUE: ULTRASOUND GUIDANCE FOR VASCULAR ACCESS  SELECTIVE LEFT HEPATIC ARTERY PERIPHERAL CATHETERIZATION x2  PRETREATMENT ANGIOGRAM  TRANS ARTERIAL CHEMOEMBOLIZATION POSTEMBOLIZATION ANGIOGRAMS  Technique and findings: Informed consent was obtained from the patient following explanation of the procedure, risks, benefits and alternatives. The patient understands, agrees and consents for the procedure. All questions were addressed. A time out was performed.As antibiotic prophylaxis, 3.375 g Zosyn IV was ordered pre-procedure and administered intravenously within one hour of incision. Maximal barrier sterile technique utilized including caps, mask, sterile gowns, sterile gloves, large sterile drape, hand hygiene, and betadine.  Intravenous Fentanyl and Versed were administered as conscious sedation during continuous cardiorespiratory monitoring by the radiology RN, with a total moderate sedation time of seventy-eight minutes.  Under sterile conditions and local anesthesia, 21 gauge single wall ultrasound access performed of the right common femoral artery. Needle exchanged over a 018 wire for transitional dilator, which allowed advancement of a 5 [place] Bentson wire.  Over the [facility] guide wire, 5-French sheath was inserted. A 5- French C2 catheter was utilized to select the celiac origin. Selective arteriography was performed. This demonstrated classic celiac trifurcation anatomy. No significant left  hepatic arterial supply from the left gastric artery. The catheter was advanced into the common hepatic artery. A Renegade high-flow micro catheter was advanced over a 0.018 guidewire into the proper hepatic artery. Selective hepatic pretreatment angiogram was performed. This confirmed good antegrade flow into distal hepatic branches. The 2 dominant lateral left hepatic segment branches were identified.  The micro catheter was advanced into the posterior left hepatic arterial branch. Confirmatory arteriography was performed. From this location, the doxorubicin enhanced LCB beads in dilute contrast were slowly administered until there was near stasis of flow. The micro catheter was flushed and partially withdrawn. Follow-up arteriography demonstrates no further significant flow through this branch into the the lateral left hepatic segment.  In similar fashion, the micro catheter was advanced into anterior left hepatic arterial branch. Confirmatory arteriography was performed. From this location, additional it doxorubicin enhanced LCB beads in dilute contrast were slowly administered until there was near stasis of flow. The micro catheter was flushed and partially withdrawn. Follow-up arteriography demonstrates no further significant flow through this branch into the the lateral left hepatic segment.  The micro catheter and a 5-F catheter were removed safely.  After confirmatory femoral arteriography, Hemostasis was obtained at the right common femoral artery access site with an Exoseal device. No immediate complication. Distal pedal pulses remained normal. The patient tolerated the treatment well.  IMPRESSION: 1. Successful subselective left hepatic artery drug-eluting bead transarterial chemoembolization for hepatocellular carcinoma.   Electronically Signed   By: Arne Cleveland M.D.   On: 01/25/2014 17:29   Ir Angiogram Selective Each Additional Vessel  01/25/2014   CLINICAL DATA:  History of cirrhosis,  hepatitis-C. Multifocal hepatoma. The smaller segment 5 of lesion was treated with percutaneous ablation.  He presents for chemoembolization of the larger segment 2 lesion with drug-eluting beads.  FLUOROSCOPY TIME:  30 min 0 seconds  TECHNIQUE: ULTRASOUND GUIDANCE FOR VASCULAR ACCESS  SELECTIVE LEFT HEPATIC ARTERY PERIPHERAL CATHETERIZATION x2  PRETREATMENT ANGIOGRAM  TRANS ARTERIAL CHEMOEMBOLIZATION POSTEMBOLIZATION ANGIOGRAMS  Technique and findings: Informed consent was obtained from the patient following explanation of the procedure, risks, benefits and alternatives. The patient understands, agrees and consents for the procedure. All questions were addressed. A time out was performed.As antibiotic prophylaxis, 3.375 g Zosyn IV was ordered pre-procedure and administered intravenously within one hour of incision. Maximal barrier sterile technique utilized including caps, mask, sterile gowns, sterile gloves, large sterile drape, hand hygiene, and betadine.  Intravenous Fentanyl and Versed were administered as conscious sedation during continuous cardiorespiratory monitoring by the radiology RN, with a total moderate sedation time of seventy-eight minutes.  Under sterile conditions and local anesthesia, 21 gauge single wall ultrasound access performed of the right common femoral artery. Needle exchanged over a 018 wire for transitional dilator, which allowed advancement of a 5 [place] Bentson wire.  Over the [facility] guide wire, 5-French sheath was inserted. A 5- French C2 catheter was utilized to select the celiac origin. Selective arteriography was performed. This demonstrated classic celiac trifurcation anatomy. No significant left hepatic arterial supply from the left gastric artery. The catheter was advanced into the common hepatic artery. A Renegade high-flow micro catheter was advanced over a 0.018 guidewire into the proper hepatic artery. Selective hepatic pretreatment angiogram was performed. This confirmed  good antegrade flow into distal hepatic branches. The 2 dominant lateral left hepatic segment branches were identified.  The micro catheter was advanced into the posterior left hepatic arterial branch. Confirmatory arteriography was performed. From this location, the doxorubicin enhanced LCB beads in dilute contrast were slowly administered until there was near stasis of flow. The micro catheter was flushed and partially withdrawn. Follow-up arteriography demonstrates no further significant flow through this branch into the the lateral left hepatic segment.  In similar fashion, the micro catheter was advanced into anterior left hepatic arterial branch. Confirmatory arteriography was performed. From this location, additional it doxorubicin enhanced LCB beads in dilute contrast were slowly administered until there was near stasis of flow. The micro catheter was flushed and partially withdrawn. Follow-up arteriography demonstrates no further significant flow through this branch into the the lateral left hepatic segment.  The micro catheter and a 5-F catheter were removed safely.  After confirmatory femoral arteriography, Hemostasis was obtained at the right common femoral artery access site with an Exoseal device. No immediate complication. Distal pedal pulses remained normal. The patient tolerated the treatment well.  IMPRESSION: 1. Successful subselective left hepatic artery drug-eluting bead transarterial chemoembolization for hepatocellular carcinoma.   Electronically Signed   By: Arne Cleveland M.D.   On: 01/25/2014 17:29   Ir Angiogram Selective Each Additional Vessel  01/25/2014   CLINICAL DATA:  History of cirrhosis, hepatitis-C. Multifocal hepatoma. The smaller segment 5 of lesion was treated with percutaneous ablation. He presents for chemoembolization of the larger segment 2 lesion with drug-eluting beads.  FLUOROSCOPY TIME:  30 min 0 seconds  TECHNIQUE: ULTRASOUND GUIDANCE FOR VASCULAR ACCESS  SELECTIVE  LEFT HEPATIC ARTERY PERIPHERAL CATHETERIZATION x2  PRETREATMENT ANGIOGRAM  TRANS ARTERIAL CHEMOEMBOLIZATION POSTEMBOLIZATION ANGIOGRAMS  Technique and findings: Informed consent was obtained from the patient following explanation of the procedure, risks, benefits and alternatives. The patient understands, agrees and consents for the procedure. All questions were addressed. A time out was performed.As antibiotic  prophylaxis, 3.375 g Zosyn IV was ordered pre-procedure and administered intravenously within one hour of incision. Maximal barrier sterile technique utilized including caps, mask, sterile gowns, sterile gloves, large sterile drape, hand hygiene, and betadine.  Intravenous Fentanyl and Versed were administered as conscious sedation during continuous cardiorespiratory monitoring by the radiology RN, with a total moderate sedation time of seventy-eight minutes.  Under sterile conditions and local anesthesia, 21 gauge single wall ultrasound access performed of the right common femoral artery. Needle exchanged over a 018 wire for transitional dilator, which allowed advancement of a 5 [place] Bentson wire.  Over the [facility] guide wire, 5-French sheath was inserted. A 5- French C2 catheter was utilized to select the celiac origin. Selective arteriography was performed. This demonstrated classic celiac trifurcation anatomy. No significant left hepatic arterial supply from the left gastric artery. The catheter was advanced into the common hepatic artery. A Renegade high-flow micro catheter was advanced over a 0.018 guidewire into the proper hepatic artery. Selective hepatic pretreatment angiogram was performed. This confirmed good antegrade flow into distal hepatic branches. The 2 dominant lateral left hepatic segment branches were identified.  The micro catheter was advanced into the posterior left hepatic arterial branch. Confirmatory arteriography was performed. From this location, the doxorubicin enhanced LCB beads  in dilute contrast were slowly administered until there was near stasis of flow. The micro catheter was flushed and partially withdrawn. Follow-up arteriography demonstrates no further significant flow through this branch into the the lateral left hepatic segment.  In similar fashion, the micro catheter was advanced into anterior left hepatic arterial branch. Confirmatory arteriography was performed. From this location, additional it doxorubicin enhanced LCB beads in dilute contrast were slowly administered until there was near stasis of flow. The micro catheter was flushed and partially withdrawn. Follow-up arteriography demonstrates no further significant flow through this branch into the the lateral left hepatic segment.  The micro catheter and a 5-F catheter were removed safely.  After confirmatory femoral arteriography, Hemostasis was obtained at the right common femoral artery access site with an Exoseal device. No immediate complication. Distal pedal pulses remained normal. The patient tolerated the treatment well.  IMPRESSION: 1. Successful subselective left hepatic artery drug-eluting bead transarterial chemoembolization for hepatocellular carcinoma.   Electronically Signed   By: Arne Cleveland M.D.   On: 01/25/2014 17:29   Ir US Guide Vasc Access Right  01/25/2014   CLINICAL DATA:  History of cirrhosis, hepatitis-C. Multifocal hepatoma. The smaller segment 5 of lesion was treated with percutaneous ablation. He presents for chemoembolization of the larger segment 2 lesion with drug-eluting beads.  FLUOROSCOPY TIME:  30 min 0 seconds  TECHNIQUE: ULTRASOUND GUIDANCE FOR VASCULAR ACCESS  SELECTIVE LEFT HEPATIC ARTERY PERIPHERAL CATHETERIZATION x2  PRETREATMENT ANGIOGRAM  TRANS ARTERIAL CHEMOEMBOLIZATION POSTEMBOLIZATION ANGIOGRAMS  Technique and findings: Informed consent was obtained from the patient following explanation of the procedure, risks, benefits and alternatives. The patient understands, agrees  and consents for the procedure. All questions were addressed. A time out was performed.As antibiotic prophylaxis, 3.375 g Zosyn IV was ordered pre-procedure and administered intravenously within one hour of incision. Maximal barrier sterile technique utilized including caps, mask, sterile gowns, sterile gloves, large sterile drape, hand hygiene, and betadine.  Intravenous Fentanyl and Versed were administered as conscious sedation during continuous cardiorespiratory monitoring by the radiology RN, with a total moderate sedation time of seventy-eight minutes.  Under sterile conditions and local anesthesia, 21 gauge single wall ultrasound access performed of the right common femoral artery. Needle exchanged  over a 018 wire for transitional dilator, which allowed advancement of a 5 [place] Bentson wire.  Over the [facility] guide wire, 5-French sheath was inserted. A 5- French C2 catheter was utilized to select the celiac origin. Selective arteriography was performed. This demonstrated classic celiac trifurcation anatomy. No significant left hepatic arterial supply from the left gastric artery. The catheter was advanced into the common hepatic artery. A Renegade high-flow micro catheter was advanced over a 0.018 guidewire into the proper hepatic artery. Selective hepatic pretreatment angiogram was performed. This confirmed good antegrade flow into distal hepatic branches. The 2 dominant lateral left hepatic segment branches were identified.  The micro catheter was advanced into the posterior left hepatic arterial branch. Confirmatory arteriography was performed. From this location, the doxorubicin enhanced LCB beads in dilute contrast were slowly administered until there was near stasis of flow. The micro catheter was flushed and partially withdrawn. Follow-up arteriography demonstrates no further significant flow through this branch into the the lateral left hepatic segment.  In similar fashion, the micro catheter was  advanced into anterior left hepatic arterial branch. Confirmatory arteriography was performed. From this location, additional it doxorubicin enhanced LCB beads in dilute contrast were slowly administered until there was near stasis of flow. The micro catheter was flushed and partially withdrawn. Follow-up arteriography demonstrates no further significant flow through this branch into the the lateral left hepatic segment.  The micro catheter and a 5-F catheter were removed safely.  After confirmatory femoral arteriography, Hemostasis was obtained at the right common femoral artery access site with an Exoseal device. No immediate complication. Distal pedal pulses remained normal. The patient tolerated the treatment well.  IMPRESSION: 1. Successful subselective left hepatic artery drug-eluting bead transarterial chemoembolization for hepatocellular carcinoma.   Electronically Signed   By: Arne Cleveland M.D.   On: 01/25/2014 17:29   Ct Abd Wo & W Cm  01/05/2014   CLINICAL DATA:  Hepatitis, cirrhosis, enlarging liver lesions. Preop planning for percutaneous ablation.  EXAM: CT ABDOMEN WITHOUT AND WITH CONTRAST  TECHNIQUE: Multidetector CT imaging of the abdomen was performed following the standard protocol before and following the bolus administration of intravenous contrast.  CONTRAST:  100 mL Omnipaque 300 IV  COMPARISON:  MR 10/12/2013 and earlier studies  FINDINGS: There has been interval enlargement of the two hepatic lesions. The far lateral segment 2 lesion measures 4.4 x 4.5 cm maximum transverse dimensions, previously 2.4 cm. The segment 5 lesion measures 2.6 x 3.7 cm, previously 14 mm. No new enhancing liver lesion identified.  Multiple partially calcified sub sub cm stones layer in the dependent aspect of the nondistended gallbladder. Diffuse pancreatic parenchymal calcifications and pancreatic duct dilatation with stones at the level of the ampulla possibly obstructive.  Unremarkable spleen, adrenal  glands, kidneys. Patchy aortic plaque without aneurysm. Patent visceral and renal arteries. Patent portal vein. Patent renal veins. No ascites. No free air. No adenopathy localized.  The large bullae in the posterior basal segment left lower lobe are now nearly completely filled with fluid/debris. There has been some increase in atelectasis/consolidation throughout the visualized dependent aspect of both lower lobes. No pleural effusion. Visualized bones unremarkable.  IMPRESSION: 1. Interval enlargement of both of hepatic lesions since previous imaging of 10/12/2013. The segment 2 lesion is no longer approachable for percutaneous ablation. The segment 5 lesion remains approachable, therefore we will proceed as previously planned with ablation of this lesion. 2. Opacification of left lower lobe bullae suggesting  infection. 3. New pancreatic duct dilatation  since previous CT of 10/23/2012, with calculi projecting within the duct near the ampulla. Correlate with any clinical or laboratory evidence of pancreatitis or pancreatic obstruction.   Electronically Signed   By: Arne Cleveland M.D.   On: 01/05/2014 09:37   Ct Guide Tissue Ablation  01/04/2014   : Clinical Data: History of cirrhosis, hepatitis-C. Elevated AFP of with Two enlarging liver lesions on MR , consistent with hepatocellular carcinoma.  CT GUIDED MICROWAVE RF TISSUE ABLATION  Comparison:  CT abdomen performed the same day, and earlier studies  Operators: Mena Pauls  Technique and findings: The procedure, risks (including but not limited to bleeding, infection, organ damage), benefits, and alternatives were explained to the patient. Questions regarding the procedure were encouraged and answered. The patient understands and consents to the procedure.See previous consultation note.  After the patient had been intubated by staff of the Department of Anesthesia, survey axial scanning of the upper abdomen was performed. The lesion was again  localized. Site was marked, prepped with Betadine, draped in usual sterile fashion, infiltrated locally with 1% lidocaine. A 22 gauge needle was placed in the scan under CT fluoroscopy. Following this trajectory, a 15 cm NeuWave 17 gauge needle microwave RF ablation needle was advanced of the lesion under intermittent CT fluoroscopy. Parallel 15 cm NeuWave 17 gauge needle was then placed as planned. The lesion was treated with 65 watts at 10 minutes through both needles. Intermittent scanning during ablation confirmed expected microbubble creation around the tumor. The treatment needles were then removed using cautery setting. The patient was extubated uneventfully and admitted for overnight observation. No immediate complication.  IMPRESSION: 1. Technically successful percutaneous microwave RF ablation of Right lobe liver lesion , under CT guidance.  1.   Electronically Signed   By: Arne Cleveland M.D.   On: 01/04/2014 17:27   Ir Embo Arterial Not [place]  01/25/2014   CLINICAL DATA:  History of cirrhosis, hepatitis-C. Multifocal hepatoma. The smaller segment 5 of lesion was treated with percutaneous ablation. He presents for chemoembolization of the larger segment 2 lesion with drug-eluting beads.  FLUOROSCOPY TIME:  30 min 0 seconds  TECHNIQUE: ULTRASOUND GUIDANCE FOR VASCULAR ACCESS  SELECTIVE LEFT HEPATIC ARTERY PERIPHERAL CATHETERIZATION x2  PRETREATMENT ANGIOGRAM  TRANS ARTERIAL CHEMOEMBOLIZATION POSTEMBOLIZATION ANGIOGRAMS  Technique and findings: Informed consent was obtained from the patient following explanation of the procedure, risks, benefits and alternatives. The patient understands, agrees and consents for the procedure. All questions were addressed. A time out was performed.As antibiotic prophylaxis, 3.375 g Zosyn IV was ordered pre-procedure and administered intravenously within one hour of incision. Maximal barrier sterile technique utilized including caps, mask,  sterile gowns, sterile gloves, large sterile drape, hand hygiene, and betadine.  Intravenous Fentanyl and Versed were administered as conscious sedation during continuous cardiorespiratory monitoring by the radiology RN, with a total moderate sedation time of seventy-eight minutes.  Under sterile conditions and local anesthesia, 21 gauge single wall ultrasound access performed of the right common femoral artery. Needle exchanged over a 018 wire for transitional dilator, which allowed advancement of a 5 [place] Bents[place] wire.  Over the [facility] guide wire, 5-French sheath was inserted. A 5- French C2 catheter was utilized to select the celiac origin. Selective arteriography was performed. This demonstrated classic celiac trifurcation anatomy. No significant left hepatic arterial supply from the left gastric artery. The catheter was advanced into the common hepatic artery. A Renegade high-flow micro catheter was advanced over a 0.018 guidewire into the proper  hepatic artery. Selective hepatic pretreatment angiogram was performed. This confirmed good antegrade flow into distal hepatic branches. The 2 dominant lateral left hepatic segment branches were identified.  The micro catheter was advanced into the posterior left hepatic arterial branch. Confirmatory arteriography was performed. From this location, the doxorubicin enhanced LCB beads in dilute contrast were slowly administered until there was near stasis of flow. The micro catheter was flushed and partially withdrawn. Follow-up arteriography demonstrates no further significant flow through this branch into the the lateral left hepatic segment.  In similar fashion, the micro catheter was advanced into anterior left hepatic arterial branch. Confirmatory arteriography was performed. From this location, additional it doxorubicin enhanced LCB beads in dilute contrast were slowly administered until there was near stasis of flow. The micro catheter was flushed and partially  withdrawn. Follow-up arteriography demonstrates no further significant flow through this branch into the the lateral left hepatic segment.  The micro catheter and a 5-F catheter were removed safely.  After confirmatory femoral arteriography, Hemostasis was obtained at the right common femoral artery access site with an Exoseal device. No immediate complication. Distal pedal pulses remained normal. The patient tolerated the treatment well.  IMPRESSION: 1. Successful subselective left hepatic artery drug-eluting bead transarterial chemoembolization for hepatocellular carcinoma.   Electronically Signed   By: Arne Cleveland M.D.   On: 01/25/2014 17:29    Discharge Exam: Blood pressure 156/82, pulse 57, temperature 98.2 F (36.8 C), temperature source Oral, resp. rate 14, height 5\' 4"  (1.626 m), weight 132 lb 15 oz (60.3 kg), SpO2 100.00%. Patient is awake, alert and oriented. Chest is clear auscultation bilaterally; heart with regular rate and rhythm ; abdomen soft, positive bowel sounds, nontender, nondistended; extremities with full range of motion and no edema, intact distal pulses; puncture site right common femoral artery clean, dry, intact gauze dressing, nontender, no hematoma  Disposition: home; radiology service will contact patient regarding follow up with Dr. Vernard Gambles in the IR clinic in approximately one month; patient was instructed to call our service at [phone] or [phone] with any questions or concerns.    Medication List    ASK your doctor about these medications       amLODipine 5 MG tablet  Commonly known as:  NORVASC  Take 5 mg by mouth every morning.     aspirin 81 MG chewable tablet  Chew 81 mg by mouth every morning.     citalopram 40 MG tablet  Commonly known as:  CELEXA  Take 40 mg by mouth every morning.     DSS 100 MG Caps  Take 100 mg by mouth 2 (two) times daily.     Fish Oil 1000 MG Caps  Take 1,000 mg by mouth every morning.     insulin lispro 100 UNIT/ML  injection  Commonly known as:  HUMALOG  Inject 5 Units into the skin 3 (three) times daily before meals.     LEVEMIR 100 UNIT/ML injection  Generic drug:  insulin detemir  Inject 41 Units into the skin every morning.     lisinopril-hydrochlorothiazide 20-12.5 MG per tablet  Commonly known as:  PRINZIDE,ZESTORETIC  Take 2 tablets by mouth every morning.     metFORMIN 1000 MG tablet  Commonly known as:  GLUCOPHAGE  Take 1,000 mg by mouth 2 (two) times daily with a meal.     multivitamin tablet  Take 1 tablet by mouth every morning.     nadolol 80 MG tablet  Commonly known as:  CORGARD  Take 80 mg by mouth every morning.     omeprazole 20 MG capsule  Commonly known as:  PRILOSEC  Take 20 mg by mouth every morning.     ondansetron 4 MG tablet  Commonly known as:  ZOFRAN  Take 1 tablet (4 mg total) by mouth every 8 (eight) hours as needed for nausea or vomiting.     oxyCODONE 5 MG immediate release tablet  Commonly known as:  ROXICODONE  Take 1 tablet (5 mg total) by mouth every 4 (four) hours as needed for severe pain.     simvastatin 10 MG tablet  Commonly known as:  ZOCOR  Take 10 mg by mouth at bedtime.     traZODone 150 MG tablet  Commonly known as:  DESYREL  Take 75 mg by mouth at bedtime.     Vitamin D 2000 UNITS tablet  Take 2,000 Units by mouth every morning.       Approximately 30 mins were spent coordinating above discharge plans  Signed: Nash Mantis 01/26/2014, 8:39 AM

## 2014-01-26 NOTE — Discharge Instructions (Signed)
Arteriogram Care After These instructions give you information on caring for yourself after your procedure. Your doctor may also give you more specific instructions. Call your doctor if you have any problems or questions after your procedure. HOME CARE  Keep your leg straight for at least 6 hours.  Do not bathe, swim, or use a hot tub until directed by your doctor. You can shower.  Do not lift anything heavier than 10 pounds (about a gallon of milk) for 2 days.  Do not walk a lot, run, or drive for 2 days.  Return to normal activities in 2 days or as told by your doctor. Finding out the results of your test Ask when your test results will be ready. Make sure you get your test results. GET HELP RIGHT AWAY IF:   You have fever.  You have more pain in your leg.  The leg that was cut is:  Bleeding.  Puffy (swollen) or red.  Cold.  Pale or changes color.  Weak.  Tingly or numb. If you go to the Emergency Room, tell your nurse that you have had an arteriogram. Take this paper with you to show the nurse. MAKE SURE YOU:  Understand these instructions.  Will watch your condition.  Will get help right away if you are not doing well or get worse. Document Released: 07/09/2008 Document Revised: 04/17/2013 Document Reviewed: 07/09/2008 [facility] Patient Information 2015 [place], [place]. This information is not intended to replace advice given to you by your health care provider. Make sure you discuss any questions you have with your health care provider. Conscious Sedation Sedation is the use of medicines to promote relaxation and relieve discomfort and anxiety. Conscious sedation is a type of sedation. Under conscious sedation you are less alert than normal but are still able to respond to instructions or stimulation. Conscious sedation is used during short medical and dental procedures. It is milder than deep sedation or general anesthesia and allows you to return to your regular  activities sooner.  LET [facility] CARE PROVIDER KNOW ABOUT:   Any allergies you have.  All medicines you are taking, including vitamins, herbs, eye drops, creams, and over-the-counter medicines.  Use of steroids (by mouth or creams).  Previous problems you or members of your family have had with the use of anesthetics.  Any blood disorders you have.  Previous surgeries you have had.  Medical conditions you have.  Possibility of pregnancy, if this applies.  Use of cigarettes, alcohol, or illegal drugs. RISKS AND COMPLICATIONS Generally, this is a safe procedure. However, as with any procedure, problems can occur. Possible problems include:  Oversedation.  Trouble breathing on your own. You may need to have a breathing tube until you are awake and breathing on your own.  Allergic reaction to any of the medicines used for the procedure. BEFORE THE PROCEDURE  You may have blood tests done. These tests can help show how well your kidneys and liver are working. They can also show how well your blood clots.  A physical exam will be done.  Only take medicines as directed by your health care provider. You may need to stop taking medicines (such as blood thinners, aspirin, or nonsteroidal anti-inflammatory drugs) before the procedure.   Do not eat or drink at least 6 hours before the procedure or as directed by your health care provider.  Arrange for a responsible adult, family member, or friend to take you home after the procedure. He or she should stay  with you for at least 24 hours after the procedure, until the medicine has worn off. PROCEDURE   An intravenous (IV) catheter will be inserted into one of your veins. Medicine will be able to flow directly into your body through this catheter. You may be given medicine through this tube to help prevent pain and help you relax.  The medical or dental procedure will be done. AFTER THE PROCEDURE  You will stay in a recovery area  until the medicine has worn off. Your blood pressure and pulse will be checked.   Depending on the procedure you had, you may be allowed to go home when you can tolerate liquids and your pain is under control. Document Released: 01/05/2001 Document Revised: 04/17/2013 Document Reviewed: 12/18/2012 [facility] Patient Information 2015 [place], [place]. This information is not intended to replace advice given to you by your health care provider. Make sure you discuss any questions you have with your health care provider.  Chemoembolization Chemoembolization is a procedure that combines two things: chemotherapy and embolization. It delivers drugs that kill cancer cells (chemotherapy) through a blood vessel directly to a tumor. At the same time, another substance is put into the blood vessel to create a blockage (embolization). This keeps blood from "feeding" the tumor and traps the chemotherapy in place.  Chemoembolization is most often used to treat liver cancer. It is well suited to the liver because the liver has two large blood vessels that supply it with blood. One blood vessel can be used to deliver chemotherapy and then be blocked. The other blood vessel can still get blood to the liver. For most people, chemoembolization helps slow the growth of the tumor or stop it from growing. If the tumor continues to grow or returns after treatment, the procedure can be done again. LET [facility] CARE PROVIDER KNOW ABOUT:  Any allergies you have.  All medicines you are taking, including vitamins, herbs, eye drops, creams, and over-the-counter medicines.  Previous problems you or members of your family have had with the use of anesthetics.  Any blood disorders you have.  Previous surgeries you have had. RISKS AND COMPLICATIONS  Generally, this is a safe procedure. However, as with any procedure, complications can occur. Possible complications include:  Infection or numbness near the surgical cut  (incision).  Swelling or bruising.  Slow healing.  Blood clots.  Damage to the blood vessel.  Damage to healthy cells close to the chemoembolization site.  Allergic reaction to the contrast dye used in the procedure.  Side effects from the chemotherapy. These could include nausea, hair loss, and a decrease in red blood cells (anemia).  Damage to the liver. This is rare. BEFORE THE PROCEDURE  Your health care provider may want you to have blood tests. These tests can help tell how well your kidneys and liver are working. They can also show how well your blood clots.  You may be asked to stop using aspirin and nonsteroidal anti-inflammatory drugs (NSAIDs) for pain relief before your procedure. This includes prescription drugs and over-the-counter drugs. You may also need to stop taking vitamin E. Your health care provider will tell you when to stop taking these medicines and when it is safe to start taking them again.  If you take blood thinners, ask your health care provider when you should stop taking them.  If you are going to be given a medicine that makes you go to sleep (general anesthetic) during your procedure, do not eat or drink  anything for 8 hours before your procedure. Required medicines may be taken with a small sip of water.  You might be asked to shower or wash with an antibacterial soap before the procedure.  Make arrangements for someone to drive you home. Depending on the procedure, you may be able to go home the same day. However, most people stay overnight in the hospital after this procedure. Ask your health care provider what to expect. PROCEDURE The procedure may vary depending on which organ or area of the body is being treated. If the liver is the target, the following will be done:  An IV tube will be inserted into one of your veins. Medicine will be able to flow directly into your body through this tube.  You may be given medicine that numbs the  chemoembolization insertion site (local anesthetic).  You may be given medicine to relax you (sedative). Some people, including children, may be given a medicine to make them sleep (general anesthetic).  A needle will be inserted into the femoral artery in the groin.  A thin, flexible tube (catheter) will be inserted into the needle and guided to a blood vessel that enters the liver.  A dye will be injected through your IV tube. Then, X-ray exams will be done. This helps to visualize the exact location of the blood vessels that lead into your liver.  The chemotherapy drugs and the substance used for embolization will be injected into the blood vessel.  More X-ray exams will be done to make sure the procedure has closed the blood vessel.  The catheter will be removed. Pressure will be put on the incision to stop any bleeding, and a bandage (dressing) will be applied. AFTER THE PROCEDURE  You will stay in a recovery area until the anesthesia has worn off. Your blood pressure and pulse will be checked.  Pain, nausea, vomiting, and fever are all common right after chemoembolization. This is called post-embolization syndrome. You may be given medicine to control this. Be sure to tell your health care provider how you are feeling.  You will need to remain lying down for 6-8 hours. This often means an overnight stay in the hospital. Sometimes people stay longer, but most people go home within 24 hours of the procedure.  You will be sent home with prescriptions and instructions for taking antibiotics to prevent infection, pain medicine, and medicine to stop nausea and vomiting. Document Released: 07/09/2008 Document Revised: 04/17/2013 Document Reviewed: 12/18/2012 [facility] Patient Information 2015 [place], [place]. This information is not intended to replace advice given to you by your health care provider. Make sure you discuss any questions you have with your health care provider.

## 2014-01-26 NOTE — Discharge Summary (Signed)
Pt comfortable, eating breakfast. No more nausea. Looks good. R groin site C/D/I. Abd soft, NT, benign. WIll d/c this AM. Plan f/u CT w/ RTC in 1 mo .

## 2014-01-29 ENCOUNTER — Other Ambulatory Visit: Payer: Self-pay | Admitting: Interventional Radiology

## 2014-01-29 DIAGNOSIS — C220 Liver cell carcinoma: Secondary | ICD-10-CM

## 2014-01-30 MED FILL — Medication: Qty: 1 | Status: AC

## 2014-02-04 ENCOUNTER — Other Ambulatory Visit: Payer: Self-pay | Admitting: Emergency Medicine

## 2014-02-04 ENCOUNTER — Other Ambulatory Visit (HOSPITAL_COMMUNITY): Payer: Self-pay | Admitting: Interventional Radiology

## 2014-02-04 DIAGNOSIS — C220 Liver cell carcinoma: Secondary | ICD-10-CM

## 2014-02-12 ENCOUNTER — Telehealth: Payer: Self-pay | Admitting: Interventional Radiology

## 2014-02-12 NOTE — Telephone Encounter (Signed)
Returned call to pt brother Danney Bungert reviewing procedural findings and followup plans. All questions answered.

## 2014-03-13 ENCOUNTER — Ambulatory Visit (HOSPITAL_COMMUNITY)
Admission: RE | Admit: 2014-03-13 | Discharge: 2014-03-13 | Disposition: A | Discharge status: Home / Self Care | Payer: Medicare Other | Source: Ambulatory Visit | Attending: Interventional Radiology | Admitting: Interventional Radiology

## 2014-03-13 ENCOUNTER — Ambulatory Visit
Admission: RE | Admit: 2014-03-13 | Discharge: 2014-03-13 | Disposition: A | Discharge status: Home / Self Care | Payer: Medicare Other | Source: Ambulatory Visit | Attending: Radiology | Admitting: Radiology

## 2014-03-13 ENCOUNTER — Encounter (HOSPITAL_COMMUNITY): Payer: Self-pay

## 2014-03-13 DIAGNOSIS — C220 Liver cell carcinoma: Secondary | ICD-10-CM | POA: Diagnosis not present

## 2014-03-13 DIAGNOSIS — Z9221 Personal history of antineoplastic chemotherapy: Secondary | ICD-10-CM | POA: Diagnosis not present

## 2014-03-13 DIAGNOSIS — Z08 Encounter for follow-up examination after completed treatment for malignant neoplasm: Secondary | ICD-10-CM | POA: Diagnosis present

## 2014-03-13 MED ORDER — IOHEXOL 300 MG/ML  SOLN
100.0000 mL | Freq: Once | INTRAMUSCULAR | Status: AC | PRN
  Administered 2014-03-13: 100 mL via INTRAVENOUS

## 2014-03-13 NOTE — Consult Note (Signed)
Chief Complaint: Chief Complaint  Patient presents with  . Follow-up    6 wk follow up DEB-TACE    Referring Physician(s): Allred,D Lennette Bihari  History of Present Illness: Nicholas Becker is a 58 y.o. male returning for his scheduled follow-up appointment 6 weeks post carotid eluding bead chemoembolization of segment 2 hepatocellular carcinoma. He had previously undergone for cutaneous microwave ablation of a segment 5 lesion, and at that time the segment 2 lesion had grown to such a size that it was not approachable for percutaneous ablation. He's done very well since the recent embolization procedure. No nausea or residual abdominal pain. His appetite remains good. He's up to his usual level of activity.  Past Medical History  Diagnosis Date  . Diabetes mellitus   . Hypertension     normal Bmet in 2012  . Hyperlipemia   . Chronic pancreatitis     calcified  . Tobacco abuse     1/2 pack per day  . Chronic hepatitis C with cirrhosis 2006    Secondary to hepatitis C and ethanol abuse, which is now in remission; varices-status post banding at [facility]; normal LFTs in 2009; thrombocytopenia In the past, platelets-153,000 in 2012  . Cholelithiasis 2009  . Bronchiectasis 2009    Left lower lobe; normal chest x-ray in 2012  . Cataracts, bilateral   . Overweight(278.02)   . 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 Nephrolithiasis 12/31/2010  . Substance abuse     Cocaine, marijuana, alcohol, stopped 10 yrs ago    Past Surgical History  Procedure Laterality Date  . Percutaneous liver biopsy  2006    Allergies: Review of patient's allergies indicates no known allergies.  Medications: Prior to Admission medications   Medication Sig Start Date End Date Taking? Authorizing Provider  amLODipine (NORVASC) 5 MG tablet Take 5 mg by mouth every morning.    Yes Historical Provider, MD  aspirin 81 MG chewable tablet Chew 81 mg by mouth every morning.   Yes Historical Provider, MD    Cholecalciferol (VITAMIN D) 2000 UNITS tablet Take 2,000 Units by mouth every morning.    Yes Historical Provider, MD  citalopram (CELEXA) 40 MG tablet Take 40 mg by mouth every morning.    Yes Historical Provider, MD  docusate sodium 100 MG CAPS Take 100 mg by mouth 2 (two) times daily. 01/05/14  Yes Hedy Jacob, PA-C  insulin detemir (LEVEMIR) 100 UNIT/ML injection Inject 41 Units into the skin every morning.    Yes Historical Provider, MD  insulin lispro (HUMALOG) 100 UNIT/ML injection Inject 5 Units into the skin 3 (three) times daily before meals.   Yes Historical Provider, MD  lisinopril-hydrochlorothiazide (PRINZIDE,ZESTORETIC) 20-12.5 MG per tablet Take 2 tablets by mouth every morning.    Yes Historical Provider, MD  metFORMIN (GLUCOPHAGE) 1000 MG tablet Take 1,000 mg by mouth 2 (two) times daily with a meal.    Yes Historical Provider, MD  Multiple Vitamin (MULTIVITAMIN) tablet Take 1 tablet by mouth every morning.    Yes Historical Provider, MD  nadolol (CORGARD) 80 MG tablet Take 80 mg by mouth every morning.    Yes Historical Provider, MD  Omega-3 Fatty Acids (FISH OIL) 1000 MG CAPS Take 1,000 mg by mouth every morning.    Yes Historical Provider, MD  omeprazole (PRILOSEC) 20 MG capsule Take 20 mg by mouth every morning.    Yes Historical Provider, MD  ondansetron (ZOFRAN) 4 MG tablet Take 1 tablet (  4 mg total) by mouth every 8 (eight) hours as needed for nausea or vomiting. 01/05/14  Yes Hedy Jacob, PA-C  oxyCODONE (ROXICODONE) 5 MG immediate release tablet Take 1 tablet (5 mg total) by mouth every 4 (four) hours as needed for severe pain. 01/05/14  Yes Hedy Jacob, PA-C  simvastatin (ZOCOR) 10 MG tablet Take 10 mg by mouth at bedtime.    Yes Historical Provider, MD  traZODone (DESYREL) 150 MG tablet Take 75 mg by mouth at bedtime.    Yes Historical Provider, MD    Family History  Problem Relation Age of Onset  . Healthy Sister   . Lung cancer Brother   . Healthy Sister    . Healthy Sister   . Healthy Sister   . Healthy Brother   . Healthy Brother   . Diabetes Daughter   . Healthy Son     History   Social History  . Marital Status: Divorced    Spouse Name: N/A    Number of Children: N/A  . Years of Education: N/A   Social History Main Topics  . Smoking status: Current Every Day Smoker -- 0.50 packs/day for 30 years    Types: Cigarettes  . Smokeless tobacco: Never Used     Comment: Patient smokes 1 pack a day  . Alcohol Use: No  . Drug Use: No     Comment: quit coaine alcohol and marijuana 10 years ago  . Sexual Activity: No   Other Topics Concern  . Not on file   Social History Narrative    ECOG Status: 1 - Symptomatic but completely ambulatory  Review of Systems: A 12 point ROS discussed and pertinent positives are indicated in the HPI above.  All other systems are negative.  Review of Systems  Constitutional: Positive for unexpected weight change. Negative for appetite change.  Gastrointestinal: Negative for nausea, vomiting, abdominal pain, diarrhea and abdominal distention.    Vital Signs: BP 146/79 mmHg  Pulse 56  Temp(Src) 98 F (36.7 C) (Oral)  Resp 14  Ht 5\' 4"  (1.626 m)  Wt 130 lb (58.968 kg)  BMI 22.30 kg/m2  SpO2 100%  Physical Exam  Constitutional: He is oriented to person, place, and time. He appears well-developed and well-nourished. No distress.  HENT:  Head: Normocephalic and atraumatic.  Eyes: EOM are normal. Right eye exhibits no discharge. No scleral icterus.  Pulmonary/Chest: Effort normal. No respiratory distress.  Abdominal: Soft. He exhibits no distension. There is no tenderness.  Neurological: He is alert and oriented to person, place, and time.  Skin: Skin is warm and dry. He is not diaphoretic.  Psychiatric: He has a normal mood and affect. His behavior is normal. Judgment and thought content normal.    Imaging: Ct Abd Wo & W Cm  03/13/2014   CLINICAL DATA:  Hepatocellular carcinoma  diagnosed 6 months ago. Six weeks status post selective left hepatic artery transarterial chemoembolization using drug eluting beads.  EXAM: CT ABDOMEN WITHOUT AND WITH CONTRAST  TECHNIQUE: Multidetector CT imaging of the abdomen was performed following the standard protocol before and following the bolus administration of intravenous contrast.  CONTRAST:  120mL OMNIPAQUE IOHEXOL 300 MG/ML  SOLN  COMPARISON:  Multiple exams, including 01/04/2014  FINDINGS: Lower chest: Complex fluid-filled bulla in the left lower lobe measures 8.4 by 5.0 cm, formerly 8.2 by 5.7 cm. This appears to have complex elements and several small locules of gas, although a smaller gaseous component then previous. Posterior basal segment  right lower lobe opacity appears to have resolved.  Hepatobiliary: The segment 2 mass previously measured 4.5 by 4.4 cm and currently measures 2.3 by 4.5 cm, with some faint internal enhancement but within increased cystic/ necrotic component especially superiorly, and overall reduction in the degree of enhancement on portal venous phase images compared to prior. The lesions have been molar most conspicuous on the portal venous phase images. The previous enhancing segment 4 lesion which on my measurements was approximately 4.1 by 2.8 cm has been replaced by generalized hypodensity throughout much of segment 4, with the hypodense region measuring 5.0 by 4.8 cm on image 37 of series 5. The degree of associated enhancement is certainly reduced. There is also some lesser degree of portal venous phase enhancement in the anterior portion of segment 6 as on image 47 of series 5, probably related to the embolization rather than tumor spread.  Nodular liver contour compatible with cirrhosis. I do not observe any new abnormally enhancing lesions. Cholelithiasis is observed.  Pancreas: Chronic calcific pancreatitis with Mild beading and dilatation of the distal most portion of the dorsal pancreatic duct in the pancreatic  tail, similar to prior. Stones are once again noted in the vicinity of the ampullary portion of the dorsal pancreatic duct.  Spleen: Unremarkable  Adrenals/Urinary Tract: Unremarkable  Stomach/Bowel: Unremarkable  Vascular/Lymphatic: Aortoiliac atherosclerotic vascular disease.  Other: No supplemental non-categorized findings.  Musculoskeletal: Unremarkable  IMPRESSION: 1. Significant in reduction in size in the segment 2 lesion, with reduced enhancement. Generalized hypodensity in segment 4 and a portion of segment 5 of the liver probably reflecting infarct related to the embolization ; the previous lesion is difficult to identify within this hypodensity but is certainly enhancing less than before. I doubt that this represents enlargement of the original lesion with large necrotic portion, although observation imaging would be prudent. 2. Cirrhosis. 3. Chronic calcific pancreatitis. Stable dilatation of the distal most portion of the dorsal pancreatic duct in the pancreatic tail.   Electronically Signed   By: Sherryl Barters M.D.   On: 03/13/2014 10:22    Labs:  CBC:  Recent Labs  12/28/13 1055 01/05/14 0405 01/25/14 0755 01/26/14 0518  WBC 5.8 5.6 6.1 7.8  HGB 14.4 14.0 13.0 13.3  HCT 41.3 40.8 38.2* 38.1*  PLT 202 139* 253 262    COAGS:  Recent Labs  06/25/13 1304 12/28/13 1055 01/25/14 0755  INR 1.10 1.12 1.21  APTT 32 33  --     BMP:  Recent Labs  12/28/13 1055 01/05/14 0405 01/25/14 0755 01/26/14 0518  NA 139 134* 136* 134*  K 4.1 3.4* 3.9 3.8  CL 98 95* 98 97  CO2 24 26 26 24   GLUCOSE 157* 186* 161* 253*  BUN 11 6 10 9   CALCIUM 9.9 8.8 9.2 8.4  CREATININE 0.75 0.72 0.68 0.60  GFRNONAA >90 >90 >90 >90  GFRAA >90 >90 >90 >90    LIVER FUNCTION TESTS:  Recent Labs  12/28/13 1055 01/05/14 0405 01/25/14 0755 01/26/14 0518  BILITOT 0.4 1.0 0.5 0.4  AST 27 354* 20 62*  ALT 13 155* 14 34  ALKPHOS 89 88 129* 126*  PROT 7.8 6.8 7.3 7.0  ALBUMIN 4.0 3.6 3.3*  3.0*    TUMOR MARKERS:  Recent Labs  08/28/13 1150 10/12/13 0001  AFPTM 298.2* 619.4*    Assessment and Plan:  My impression is that he has done very well post chemoembolization with drug-eluting beads of the hepatic segment 2 hepatocellular carcinoma. I'm  very happy with the significant tumor response on today's follow-up CT. There is no evidence of residual enhancing tumor in the site of the segment 5 lesion ablation. I also Review the imaging with Dr. Kathlene Cote. We agree that no additional intervention is indicated at  this point. We'll plan on surveillance imaging in about 3 months. The segment 2 lesion is in a  challenging location for percutaneous ablation secondary to its proximity to the diaphragm and right ventricle. However, it would be approachable for repeat DEB/TACE if needed. I reviewed the findings and plan with the patient and caretaker, who seemed to  understand and had  questions answered. Accordingly, we'll plan follow-up MR liver with contrast in 3 months with a clinic visit at that time.     I spent a total of 30 minutes face to face in clinical consultation, greater than 50% of which was counseling/coordinating care for multifocal hepatocellular carcinoma.  Signed: Giang Hemme III,DAYNE Renata Gambino 03/13/2014, 12:18 PM

## 2014-04-16 ENCOUNTER — Encounter (INDEPENDENT_AMBULATORY_CARE_PROVIDER_SITE_OTHER): Payer: Self-pay

## 2014-04-22 ENCOUNTER — Encounter (INDEPENDENT_AMBULATORY_CARE_PROVIDER_SITE_OTHER): Payer: Self-pay

## 2014-04-25 ENCOUNTER — Encounter (INDEPENDENT_AMBULATORY_CARE_PROVIDER_SITE_OTHER): Payer: Self-pay

## 2014-05-16 ENCOUNTER — Other Ambulatory Visit (HOSPITAL_COMMUNITY): Payer: Self-pay | Admitting: Interventional Radiology

## 2014-05-16 DIAGNOSIS — C220 Liver cell carcinoma: Secondary | ICD-10-CM

## 2014-05-21 ENCOUNTER — Other Ambulatory Visit: Payer: Self-pay | Admitting: Radiology

## 2014-05-21 DIAGNOSIS — C220 Liver cell carcinoma: Secondary | ICD-10-CM

## 2014-06-05 ENCOUNTER — Ambulatory Visit (HOSPITAL_COMMUNITY)
Admission: RE | Admit: 2014-06-05 | Discharge: 2014-06-05 | Disposition: A | Discharge status: Home / Self Care | Payer: Medicare Other | Source: Ambulatory Visit | Attending: Interventional Radiology | Admitting: Interventional Radiology

## 2014-06-05 ENCOUNTER — Ambulatory Visit
Admission: RE | Admit: 2014-06-05 | Discharge: 2014-06-05 | Disposition: A | Discharge status: Home / Self Care | Payer: Medicare Other | Source: Ambulatory Visit | Attending: Interventional Radiology | Admitting: Interventional Radiology

## 2014-06-05 DIAGNOSIS — C220 Liver cell carcinoma: Secondary | ICD-10-CM

## 2014-06-05 DIAGNOSIS — K861 Other chronic pancreatitis: Secondary | ICD-10-CM | POA: Diagnosis not present

## 2014-06-05 DIAGNOSIS — K8020 Calculus of gallbladder without cholecystitis without obstruction: Secondary | ICD-10-CM | POA: Diagnosis not present

## 2014-06-05 DIAGNOSIS — Z08 Encounter for follow-up examination after completed treatment for malignant neoplasm: Secondary | ICD-10-CM | POA: Insufficient documentation

## 2014-06-05 DIAGNOSIS — Z9221 Personal history of antineoplastic chemotherapy: Secondary | ICD-10-CM | POA: Diagnosis not present

## 2014-06-05 HISTORY — PX: IR GENERIC HISTORICAL: IMG1180011

## 2014-06-05 MED ORDER — GADOXETATE DISODIUM 0.25 MMOL/ML IV SOLN
5.0000 mL | Freq: Once | INTRAVENOUS | Status: AC | PRN
  Administered 2014-06-05: 6 mL via INTRAVENOUS

## 2014-06-05 NOTE — Consult Note (Signed)
Chief Complaint: Chief Complaint  Patient presents with  . Follow-up    5 mo follow up [facility] for [facility]    Referring Physician(s): Rickard Rhymes III  History of Present Illness: Nicholas Becker is a 59 y.o. male with a long history of cirrhosis who was noted to have new enhancing lesions on surveillance MRI of 05/23/2013. Follow-up scan June 2015 showed progression in size, with a concurrent elevated AFP consistent with multifocal hepatocellular carcinoma. He underwent percutaneous microwave RF ablation of a segment 4/5 lesion in September 2015. There had been interval enlargement of the segment 2 lesion which was no longer amenable to percutaneous ablation, so this was treated with subselective drug-eluting beads transarterial chemo embolization in October 2015. He presents today for follow-up after his scheduled postprocedure surveillance liver MRI. He's currently asymptomatic. Appetite is good. No weight loss. No new symptoms.  Past Medical History  Diagnosis Date  . Diabetes mellitus   . Hypertension     normal Bmet in 2012  . Hyperlipemia   . Chronic pancreatitis     calcified  . Tobacco abuse     1/2 pack per day  . Chronic hepatitis C with cirrhosis 2006    Secondary to hepatitis C and ethanol abuse, which is now in remission; varices-status post banding at [facility]; normal LFTs in 2009; thrombocytopenia In the past, platelets-153,000 in 2012  . Cholelithiasis 2009  . Bronchiectasis 2009    Left lower lobe; normal chest x-ray in 2012  . Cataracts, bilateral   . Overweight(278.02)   . 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 Nephrolithiasis 12/31/2010  . Substance abuse     Cocaine, marijuana, alcohol, stopped 10 yrs ago    Past Surgical History  Procedure Laterality Date  . Percutaneous liver biopsy  2006    Allergies: Review of patient's allergies indicates no known allergies.  Medications: Prior to Admission medications   Medication Sig Start Date  End Date Taking? Authorizing Provider  amLODipine (NORVASC) 5 MG tablet Take 5 mg by mouth every morning.    Yes Historical Provider, MD  aspirin 81 MG chewable tablet Chew 81 mg by mouth every morning.   Yes Historical Provider, MD  Cholecalciferol (VITAMIN D) 2000 UNITS tablet Take 2,000 Units by mouth every morning.    Yes Historical Provider, MD  citalopram (CELEXA) 40 MG tablet Take 40 mg by mouth every morning.    Yes Historical Provider, MD  insulin detemir (LEVEMIR) 100 UNIT/ML injection Inject 41 Units into the skin every morning.    Yes Historical Provider, MD  insulin lispro (HUMALOG) 100 UNIT/ML injection Inject 5 Units into the skin 3 (three) times daily before meals.   Yes Historical Provider, MD  lisinopril-hydrochlorothiazide (PRINZIDE,ZESTORETIC) 20-12.5 MG per tablet Take 2 tablets by mouth every morning.    Yes Historical Provider, MD  metFORMIN (GLUCOPHAGE) 1000 MG tablet Take 1,000 mg by mouth 2 (two) times daily with a meal.    Yes Historical Provider, MD  Multiple Vitamin (MULTIVITAMIN) tablet Take 1 tablet by mouth every morning.    Yes Historical Provider, MD  nadolol (CORGARD) 80 MG tablet Take 80 mg by mouth every morning.    Yes Historical Provider, MD  Omega-3 Fatty Acids (FISH OIL) 1000 MG CAPS Take 1,000 mg by mouth every morning.    Yes Historical Provider, MD  omeprazole (PRILOSEC) 20 MG capsule Take 20 mg by mouth every morning.    Yes Historical Provider, MD  simvastatin (ZOCOR) 10 MG tablet Take 10 mg by mouth at bedtime.    Yes Historical Provider, MD  traZODone (DESYREL) 150 MG tablet Take 75 mg by mouth at bedtime.    Yes Historical Provider, MD  docusate sodium 100 MG CAPS Take 100 mg by mouth 2 (two) times daily. 01/05/14   Hedy Jacob, PA-C  ondansetron (ZOFRAN) 4 MG tablet Take 1 tablet (4 mg total) by mouth every 8 (eight) hours as needed for nausea or vomiting. 01/05/14   Hedy Jacob, PA-C  oxyCODONE (ROXICODONE) 5 MG immediate release tablet Take 1  tablet (5 mg total) by mouth every 4 (four) hours as needed for severe pain. 01/05/14   Hedy Jacob, PA-C    Family History  Problem Relation Age of Onset  . Healthy Sister   . Lung cancer Brother   . Healthy Sister   . Healthy Sister   . Healthy Sister   . Healthy Brother   . Healthy Brother   . Diabetes Daughter   . Healthy Son     History   Social History  . Marital Status: Divorced    Spouse Name: N/A  . Number of Children: N/A  . Years of Education: N/A   Social History Main Topics  . Smoking status: Current Every Day Smoker -- 0.50 packs/day for 30 years    Types: Cigarettes  . Smokeless tobacco: Never Used     Comment: Patient smokes 1 pack a day  . Alcohol Use: No  . Drug Use: No     Comment: quit coaine alcohol and marijuana 10 years ago  . Sexual Activity: No   Other Topics Concern  . Not on file   Social History Narrative    ECOG Status: 0 - Asymptomatic    Review of Systems Review of Systems: A 12 point ROS discussed and pertinent positives are indicated in the HPI above.  All other systems are negative.   Vital Signs: BP 167/82 mmHg  Pulse 55  Temp(Src) 97.6 F (36.4 C) (Oral)  Resp 14  Ht 5\' 4"  (1.626 m)  Wt 130 lb (58.968 kg)  BMI 22.30 kg/m2  SpO2 100%  Physical Exam  Imaging: MRI ABDOMEN WITHOUT AND WITH CONTRAST   TECHNIQUE:  Multiplanar multisequence MR imaging of the abdomen was performed  both before and after the administration of intravenous contrast.   CONTRAST: 5 cc Eovist   COMPARISON: Multiple exams, including 03/13/2014 and 10/12/2013   FINDINGS:  Lower chest: Essentially stable appearance of complex left lower  lobe bulla or unusual bronchocele, without appreciable enhancement.   Hepatobiliary: Continued volume loss and lack of appreciable  abnormal enhancement in the lesion previously along segments 4 and  5. Subtraction images demonstrate hypoenhancement in this vicinity  relative to the rest of liver  parenchyma compatible with successful  ablation with no findings of recurrence.   Similarly, the previous large lesion in segment 2 of the liver which  was notably arterial phase hyperenhancing on 01/04/2014 is no longer  readily apparent. There is hypo enhancement in this vicinity for  example on image 33 of series 100, suggesting successful  chemoembolization.   Currently I do not observe significant abnormal arterial phase  enhancement in the liver. No local recurrence or new lesion is  visible. Lack of filling of segment 2 portal vein is branch, images  38 through 32 of series 503, compatible with thrombus. I do not  observe enhancement in this venous structure to suggest  tumor  thrombus.   Scattered small gallstones in the gallbladder. No biliary  dilatation.   Pancreas: Atrophic and with findings of chronic calcific  pancreatitis.   Spleen: Unremarkable   Adrenals/Urinary Tract: Unremarkable   Stomach/Bowel: Unremarkable   Vascular/Lymphatic: Unremarkable   Other: No supplemental non-categorized findings.   Musculoskeletal: Unremarkable   IMPRESSION:  1. No findings of recurrence at the site of prior segment 4/5  ablation or the segment 2 chemoembolization.  2. 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  3. Stable appearance of complex left lower lobe bulla or  bronchocele, without appreciable enhancement.  4. Atrophic pancreas with chronic calcific pancreatitis.  5. Cholelithiasis.    Electronically Signed  By: Van Clines M.D.  On: 06/05/2014 14:31           Labs:  CBC:  Recent Labs  12/28/13 1055 01/05/14 0405 01/25/14 0755 01/26/14 0518  WBC 5.8 5.6 6.1 7.8  HGB 14.4 14.0 13.0 13.3  HCT 41.3 40.8 38.2* 38.1*  PLT 202 139* 253 262    COAGS:  Recent Labs  06/25/13 1304 12/28/13 1055 01/25/14 0755  INR 1.10 1.12 1.21  APTT 32 33  --     BMP:  Recent Labs   12/28/13 1055 01/05/14 0405 01/25/14 0755 01/26/14 0518  NA 139 134* 136* 134*  K 4.1 3.4* 3.9 3.8  CL 98 95* 98 97  CO2 24 26 26 24   GLUCOSE 157* 186* 161* 253*  BUN 11 6 10 9   CALCIUM 9.9 8.8 9.2 8.4  CREATININE 0.75 0.72 0.68 0.60  GFRNONAA >90 >90 >90 >90  GFRAA >90 >90 >90 >90    LIVER FUNCTION TESTS:  Recent Labs  12/28/13 1055 01/05/14 0405 01/25/14 0755 01/26/14 0518  BILITOT 0.4 1.0 0.5 0.4  AST 27 354* 20 62*  ALT 13 155* 14 34  ALKPHOS 89 88 129* 126*  PROT 7.8 6.8 7.3 7.0  ALBUMIN 4.0 3.6 3.3* 3.0*    TUMOR MARKERS:  Recent Labs  08/28/13 1150 10/12/13 0001  AFPTM 298.2* 619.4*   AFP 512 from lacorp 04/01/2014  Assessment and Plan:  My impression is that he's had an excellent response to combined percutaneous and transarterial treatment of his multifocal hepatocellular carcinoma. My expectation was that the arterial embolization would decrease the segment 2 lesion to a size amenable to percutaneous ablation, but in fact no residual tumor is even evident, exceeding my expectation. No new lesions or other complicating features. Good AFP response post procedure. Symptomatically he is doing very well. Therefore, we will enter a surveillance mode, planning on  follow-up MR liver with contrast and AFP in 6 months, and see him back in clinic at that time.  Thank you for allowing me to participate in care of this pleasant patient. I greatly enjoyed seeing CAMBREN HELM and look forward to participating in their care.  Signed: Quoc Tome III, DAYNE Karrigan Messamore 06/05/2014, 3:22 PM   I spent a total of 30 minutes face to face in clinical consultation, greater than 50% of which was counseling/coordinating care for multifocal hepatocellular carcinoma.

## 2014-06-12 NOTE — Progress Notes (Signed)
So far so good. Agree patient has had response to transarterial chemoembolization

## 2014-06-13 ENCOUNTER — Encounter (INDEPENDENT_AMBULATORY_CARE_PROVIDER_SITE_OTHER): Payer: Self-pay

## 2014-08-13 ENCOUNTER — Encounter (INDEPENDENT_AMBULATORY_CARE_PROVIDER_SITE_OTHER): Payer: Self-pay | Admitting: Internal Medicine

## 2014-08-13 ENCOUNTER — Encounter (INDEPENDENT_AMBULATORY_CARE_PROVIDER_SITE_OTHER): Payer: Self-pay | Admitting: *Deleted

## 2014-08-13 ENCOUNTER — Ambulatory Visit (INDEPENDENT_AMBULATORY_CARE_PROVIDER_SITE_OTHER): Payer: Medicare Other | Admitting: Internal Medicine

## 2014-08-13 VITALS — BP 130/80 | HR 68 | Temp 98.1°F | Resp 18 | Ht 64.0 in | Wt 132.5 lb

## 2014-08-13 DIAGNOSIS — K7469 Other cirrhosis of liver: Secondary | ICD-10-CM | POA: Diagnosis not present

## 2014-08-13 DIAGNOSIS — R634 Abnormal weight loss: Secondary | ICD-10-CM | POA: Diagnosis not present

## 2014-08-13 DIAGNOSIS — C220 Liver cell carcinoma: Secondary | ICD-10-CM

## 2014-08-13 NOTE — Patient Instructions (Signed)
MRI of liver to be scheduled this week. Physician will call with results and further recommendations.

## 2014-08-13 NOTE — Progress Notes (Signed)
Presenting complaint;  Follow-up for cirrhosis and HCC.  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 He was found to have [place] on screening. He underwent percutaneous ablation of lesion in segment 4 and 5 in September 2015 and chemotherapy embolization of lesion in segment 2 in October 2015. He responded nicely with drop in AFP levels. 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 He was noted have cholelithiasis and atrophic pancreas with calcification. Patient had routine blood work and noted to have significant rise in AFP. Therefore this visit was arranged.  Subjective:  Patient is accompanied by Stanton Kidney who is an employee at rest home. He has no complaints. He has lost 11 pounds in the last 10 months. He states he has very good appetite. He denies abdominal or chest pain. He also denies fever chills or night sweats. His bowels move regularly. He denies melena or rectal bleeding.    Current Medications: Outpatient Encounter Prescriptions as of 08/13/2014  Medication Sig  . amLODipine (NORVASC) 5 MG tablet Take 5 mg by mouth every morning.   Marland Kitchen aspirin 81 MG chewable tablet Chew 81 mg by mouth every morning.  . Cholecalciferol (VITAMIN D) 2000 UNITS tablet Take 2,000 Units by mouth every morning.   . citalopram (CELEXA) 40 MG tablet Take 40 mg by mouth every morning.   . insulin detemir (LEVEMIR) 100 UNIT/ML injection Inject 41 Units into the skin every morning.   . insulin lispro (HUMALOG) 100 UNIT/ML injection Inject 5 Units into the skin 3 (three) times daily before meals.  Marland Kitchen lisinopril-hydrochlorothiazide (PRINZIDE,ZESTORETIC) 20-12.5 MG per tablet Take 2 tablets by mouth every morning.   . metFORMIN (GLUCOPHAGE) 1000 MG tablet Take 1,000 mg by  mouth 2 (two) times daily with a meal.   . Multiple Vitamin (MULTIVITAMIN) tablet Take 1 tablet by mouth every morning.   . nadolol (CORGARD) 80 MG tablet Take 80 mg by mouth every morning.   . Omega-3 Fatty Acids (FISH OIL) 1000 MG CAPS Take 1,000 mg by mouth every morning.   Marland Kitchen omeprazole (PRILOSEC) 20 MG capsule Take 20 mg by mouth every morning.   . ondansetron (ZOFRAN) 4 MG tablet Take 1 tablet (4 mg total) by mouth every 8 (eight) hours as needed for nausea or vomiting.  Marland Kitchen oxyCODONE (ROXICODONE) 5 MG immediate release tablet Take 1 tablet (5 mg total) by mouth every 4 (four) hours as needed for severe pain.  . simvastatin (ZOCOR) 10 MG tablet Take 10 mg by mouth at bedtime.   . traZODone (DESYREL) 150 MG tablet Take 75 mg by mouth at bedtime.   . [DISCONTINUED] docusate sodium 100 MG CAPS Take 100 mg by mouth 2 (two) times daily. (Patient not taking: Reported on 08/13/2014)     Objective: Blood pressure 130/80, pulse 68, temperature 98.1 F (36.7 C), temperature source Oral, resp. rate 18, height 5\' 4"  (1.626 m), weight 132 lb 8 oz (60.102 kg). Patient is alert and in no acute distress. Conjunctiva is pink. Sclera is nonicteric Oropharyngeal mucosa is normal. No neck masses or thyromegaly noted. Cardiac exam with regular rhythm normal S1 and S2. No murmur or gallop noted. Lungs are clear to auscultation. Abdomen is soft and nontender without organomegaly or masses. Liver span is 14 cm. No  LE edema or clubbing noted.  Labs/studies Results: Lab data from 07/29/2014  WBC 5.5, H&H 14.6 and 44.8 and platelet count is 210 K  Glucose 249, BUN 11, creatinine 0.76, sodium 139, potassium 4.7, chloride 97, CO2 26  Bilirubin 0.5, AP 108, AST 30, ALT 18, total protein 7.3, albumin 4.4 and serum calcium 9.8.  AFP is 3578   AFP was 619.4 on 10/12/2013  AFP was 11370 on 11/29/2013 AFP was  718.6 on 01/30/2014. AFP was 512.2 on 04/01/2014 AFP was 954.9 on 05/28/2014   Assessment:  #1.  Hepatocellular carcinoma. Patient was treated with combination of percutaneous ablation of lesion in segment 4 and 5 and chemoembolization with doxorubicin in segment 2.  Alpha-fetoprotein decreased and MRI in February 2016 did not reveal enhancing lesions but now his AFP has quadrupled to 3578 in 11 weeks. Therefore I'm concerned that [facility] has recovered at previous sites or at new location. #2. Weight loss may be related to [facility]. #3.  Cirrhosis secondary to successfully treated [place]. He appears to have well compensated hepatic function.   Plan:  MRI of liver with contrast. Possible referral for transplant evaluation.

## 2014-08-20 ENCOUNTER — Ambulatory Visit (HOSPITAL_COMMUNITY)
Admission: RE | Admit: 2014-08-20 | Discharge: 2014-08-20 | Disposition: A | Discharge status: Home / Self Care | Payer: Medicare Other | Source: Ambulatory Visit | Attending: Internal Medicine | Admitting: Internal Medicine

## 2014-08-20 DIAGNOSIS — K739 Chronic hepatitis, unspecified: Secondary | ICD-10-CM | POA: Insufficient documentation

## 2014-08-20 DIAGNOSIS — K861 Other chronic pancreatitis: Secondary | ICD-10-CM | POA: Diagnosis not present

## 2014-08-20 DIAGNOSIS — C220 Liver cell carcinoma: Secondary | ICD-10-CM

## 2014-08-20 DIAGNOSIS — K7469 Other cirrhosis of liver: Secondary | ICD-10-CM | POA: Insufficient documentation

## 2014-08-20 DIAGNOSIS — I81 Portal vein thrombosis: Secondary | ICD-10-CM | POA: Insufficient documentation

## 2014-08-20 DIAGNOSIS — R634 Abnormal weight loss: Secondary | ICD-10-CM

## 2014-08-20 LAB — POCT I-STAT CREATININE: CREATININE: 0.9 mg/dL (ref 0.50–1.35)

## 2014-08-20 MED ORDER — GADOXETATE DISODIUM 0.25 MMOL/ML IV SOLN
5.0000 mL | Freq: Once | INTRAVENOUS | Status: AC | PRN
  Administered 2014-08-20: 5 mL via INTRAVENOUS

## 2014-08-22 ENCOUNTER — Encounter (INDEPENDENT_AMBULATORY_CARE_PROVIDER_SITE_OTHER): Payer: Self-pay

## 2014-08-27 ENCOUNTER — Telehealth: Payer: Self-pay | Admitting: *Deleted

## 2014-08-27 NOTE — Telephone Encounter (Signed)
Pt brother Eastin Swing ([phone]) called and stated they found a new tumor on his brother liver. He also stated pt has been following up with Dr Laural Golden in [place] and he is also going to [facility] next week to discuss a transplant.  Pt brother would like for Dr Vernard Gambles to review the new findings and call him with any questions.

## 2014-09-19 ENCOUNTER — Ambulatory Visit
Admission: RE | Admit: 2014-09-19 | Discharge: 2014-09-19 | Disposition: A | Discharge status: Home / Self Care | Payer: Medicare Other | Source: Ambulatory Visit | Attending: Interventional Radiology | Admitting: Interventional Radiology

## 2014-09-19 ENCOUNTER — Other Ambulatory Visit (HOSPITAL_COMMUNITY): Payer: Self-pay | Admitting: Interventional Radiology

## 2014-09-19 DIAGNOSIS — C220 Liver cell carcinoma: Secondary | ICD-10-CM

## 2014-10-02 NOTE — Consult Note (Signed)
Chief Complaint:  progression of hepatoma  Referring Physician(s): Dalvin Clipper  History of Present Illness: Nicholas Becker is a 59 y.o. male well-known to our service from previous chemoembolization and RF ablation of  multifocal hepatoma in the setting of hepatitis C and alcohol-related cirrhosis. Clinically he is doing well. However, his most recent MR demonstrated significant progression of left-sided tumor with portal venous involvement. He's currently also being evaluated by the [facility] liver center.  Past Medical History  Diagnosis Date  . Diabetes mellitus   . Hypertension     normal Bmet in 2012  . Hyperlipemia   . Chronic pancreatitis     calcified  . Tobacco abuse     1/2 pack per day  . Chronic hepatitis C with cirrhosis 2006    Secondary to hepatitis C and ethanol abuse, which is now in remission; varices-status post banding at [facility]; normal LFTs in 2009; thrombocytopenia In the past, platelets-153,000 in 2012  . Cholelithiasis 2009  . Bronchiectasis 2009    Left lower lobe; normal chest x-ray in 2012  . Cataracts, bilateral   . Overweight(278.02)   . 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 Nephrolithiasis 12/31/2010  . Substance abuse     Cocaine, marijuana, alcohol, stopped 10 yrs ago    Past Surgical History  Procedure Laterality Date  . Percutaneous liver biopsy  2006    Allergies: Review of patient's allergies indicates no known allergies.  Medications: Prior to Admission medications   Medication Sig Start Date End Date Taking? Authorizing Provider  amLODipine (NORVASC) 5 MG tablet Take 5 mg by mouth every morning.     Historical Provider, MD  aspirin 81 MG chewable tablet Chew 81 mg by mouth every morning.    Historical Provider, MD  Cholecalciferol (VITAMIN D) 2000 UNITS tablet Take 2,000 Units by mouth every morning.     Historical Provider, MD  citalopram (CELEXA) 40 MG tablet Take 40 mg by mouth every morning.     Historical Provider, MD    insulin detemir (LEVEMIR) 100 UNIT/ML injection Inject 41 Units into the skin every morning.     Historical Provider, MD  insulin lispro (HUMALOG) 100 UNIT/ML injection Inject 5 Units into the skin 3 (three) times daily before meals.    Historical Provider, MD  lisinopril-hydrochlorothiazide (PRINZIDE,ZESTORETIC) 20-12.5 MG per tablet Take 2 tablets by mouth every morning.     Historical Provider, MD  metFORMIN (GLUCOPHAGE) 1000 MG tablet Take 1,000 mg by mouth 2 (two) times daily with a meal.     Historical Provider, MD  Multiple Vitamin (MULTIVITAMIN) tablet Take 1 tablet by mouth every morning.     Historical Provider, MD  nadolol (CORGARD) 80 MG tablet Take 80 mg by mouth every morning.     Historical Provider, MD  Omega-3 Fatty Acids (FISH OIL) 1000 MG CAPS Take 1,000 mg by mouth every morning.     Historical Provider, MD  omeprazole (PRILOSEC) 20 MG capsule Take 20 mg by mouth every morning.     Historical Provider, MD  ondansetron (ZOFRAN) 4 MG tablet Take 1 tablet (4 mg total) by mouth every 8 (eight) hours as needed for nausea or vomiting. 01/05/14   Hedy Jacob, PA-C  oxyCODONE (ROXICODONE) 5 MG immediate release tablet Take 1 tablet (5 mg total) by mouth every 4 (four) hours as needed for severe pain. 01/05/14   Hedy Jacob, PA-C  simvastatin (ZOCOR) 10 MG tablet Take 10 mg by  mouth at bedtime.     Historical Provider, MD  traZODone (DESYREL) 150 MG tablet Take 75 mg by mouth at bedtime.     Historical Provider, MD     Family History  Problem Relation Age of Onset  . Healthy Sister   . Lung cancer Brother   . Healthy Sister   . Healthy Sister   . Healthy Sister   . Healthy Brother   . Healthy Brother   . Diabetes Daughter   . Healthy Son     History   Social History  . Marital Status: Divorced    Spouse Name: N/A  . Number of Children: N/A  . Years of Education: N/A   Social History Main Topics  . Smoking status: Current Every Day Smoker -- 0.50 packs/day for 30  years    Types: Cigarettes  . Smokeless tobacco: Never Used     Comment: Patient smokes 1 pack a day  . Alcohol Use: No  . Drug Use: No     Comment: quit coaine alcohol and marijuana 10 years ago  . Sexual Activity: No   Other Topics Concern  . Not on file   Social History Narrative   Radiology EXAM: MRI ABDOMEN WITHOUT AND WITH CONTRAST  TECHNIQUE: Multiplanar multisequence MR imaging of the abdomen was performed both before and after the administration of intravenous contrast.  CONTRAST: 5.0 ml Eovist, a mixed extracellular and hepatocyte specific contrast agent.  COMPARISON: 06/05/2014 and CT of 03/13/2014.  FINDINGS: Mild motion degradation.  Lower chest: Similar appearance of left lower lobe macro lobulated "Mass" . This demonstrates T1 hyperintensity but no significant post-contrast enhancement. Similar morphology of a tiny lesion in the subdiaphragmatic perisplenic space. Similar. Normal heart size without pericardial or pleural effusion.  Hepatobiliary: A focus of T2 hypo intensity within segments 4B and 5 is likely related to prior treatment effects and measures similarly, including at 3.2 cm on image 38 of series 21. No well-defined enhancement in this area.  Vague T2 hyperintensity within the subcapsular portion of segment 2 is similar on image 30 of series 3.  There has been interval progression of left portal vein thrombus with development of venous enlargement and T2 hyperintensity/restricted diffusion extending about the vein. Example image 61 of series 22 and image 21 of series 23. This measures on the order of 3.0 x 6.2 cm on image 26 of series 18. Heterogeneous hypo enhancement within this area and the surrounding central portion of segments 2, 3, and 4B. Minimal extension of thrombus into the main portal vein, new on image 21 of series 3.  Cholelithiasis without acute cholecystitis.  Pancreas: Findings of chronic calcific  pancreatitis with atrophy and duct dilatation in the body and tail.  Spleen: Normal  Adrenals/Urinary Tract: Normal kidneys, without hydronephrosis.  Stomach/Bowel: Normal stomach and small bowel loops within the abdomen. Colonic stool burden suggests constipation.  Vascular/Lymphatic: Circumaortic left renal vein. Normal caliber of the aorta and branch vessels. Hepatic veins patent. No abdominal adenopathy.  Other: No ascites.  Musculoskeletal: No acute osseous abnormality.  IMPRESSION: 1. Similar appearance of treatment changes of ablation within segment 4/5 and chemo embolization within segment 2. No convincing evidence of locally recurrent disease at either site. 2. Progressive thrombus within the left portal vein with surrounding signal abnormality and venous enlargement. Findings are highly suspicious for malignant thrombus and direct tumor involvement of the surrounding central left hepatic lobe. Although some of the more diffuse hypo enhancement could be due to venous thrombosis,  the extent of T2 hyperintensity and restricted diffusion strongly suggests central tumor. 3. Similar appearance of left lower lobe process which is likely due to prior infection, superimposed upon bullous disease. 4. Chronic pancreatitis. 5. Cholelithiasis. 6. Mild motion degradation.   Electronically Signed  By: Abigail Miyamoto M.D.  On: 08/20/2014 11:18  ECOG Status: 1 - Symptomatic but completely ambulatory  Review of Systems: A 12 point ROS discussed and pertinent positives are indicated in the HPI above.  All other systems are negative.  Review of Systems  Vital Signs: There were no vitals taken for this visit.  Physical Exam  Mallampati Score:     Imaging: No results found.  Labs:  CBC:  Recent Labs  12/28/13 1055 01/05/14 0405 01/25/14 0755 01/26/14 0518  WBC 5.8 5.6 6.1 7.8  HGB 14.4 14.0 13.0 13.3  HCT 41.3 40.8 38.2* 38.1*  PLT 202 139* 253  262    COAGS:  Recent Labs  12/28/13 1055 01/25/14 0755  INR 1.12 1.21  APTT 33  --     BMP:  Recent Labs  12/28/13 1055 01/05/14 0405 01/25/14 0755 01/26/14 0518 08/20/14 0913  NA 139 134* 136* 134*  --   K 4.1 3.4* 3.9 3.8  --   CL 98 95* 98 97  --   CO2 24 26 26 24   --   GLUCOSE 157* 186* 161* 253*  --   BUN 11 6 10 9   --   CALCIUM 9.9 8.8 9.2 8.4  --   CREATININE 0.75 0.72 0.68 0.60 0.90  GFRNONAA >90 >90 >90 >90  --   GFRAA >90 >90 >90 >90  --     LIVER FUNCTION TESTS:  Recent Labs  12/28/13 1055 01/05/14 0405 01/25/14 0755 01/26/14 0518  BILITOT 0.4 1.0 0.5 0.4  AST 27 354* 20 62*  ALT 13 155* 14 34  ALKPHOS 89 88 129* 126*  PROT 7.8 6.8 7.3 7.0  ALBUMIN 4.0 3.6 3.3* 3.0*    TUMOR MARKERS:  Recent Labs  10/12/13 0001  AFPTM 619.4*    Assessment and Plan:  He's had significant tumor progression since his previous imaging. The lesion is too large for percutaneous ablation, and he has been previously deemed not to be a resection or transplant candidate. Treatment options include systemic or transarterial chemotherapy versus Y 90 radio embolization which could be  performed in conjunction with systemic chemotherapy. We again reviewed Y 90 radio embolization, details of the technique, possible risks and complications, anticipated benefits, and the fact that this represents a palliative measure and not curative. He'll need preoperative embolization of GDA and probably the right gastric artery if he decides to go forward with this. He's currently awaiting  the plan from the [facility] liver center evaluation, so I'll await that letter in order to coordinate care with them. Thank you for this interesting consult.  I greatly enjoyed meeting NYAIR DEPAULO and look forward to participating in their care.  Signed: Chanan Detwiler III, DAYNE Joud Ingwersen 10/02/2014, 11:52 AM   I spent a total of    25 Minutes in face to face in clinical consultation, greater than 50% of which  was counseling/coordinating care for recurrent hepatoma.

## 2014-10-08 ENCOUNTER — Encounter (HOSPITAL_COMMUNITY)
Admission: RE | Admit: 2014-10-08 | Discharge: 2014-10-08 | Disposition: A | Discharge status: Home / Self Care | Payer: Medicare Other | Source: Ambulatory Visit | Attending: Oral Surgery | Admitting: Oral Surgery

## 2014-10-08 ENCOUNTER — Encounter (HOSPITAL_COMMUNITY): Payer: Self-pay

## 2014-10-08 DIAGNOSIS — B182 Chronic viral hepatitis C: Secondary | ICD-10-CM | POA: Diagnosis not present

## 2014-10-08 DIAGNOSIS — E663 Overweight: Secondary | ICD-10-CM | POA: Diagnosis not present

## 2014-10-08 DIAGNOSIS — K7460 Unspecified cirrhosis of liver: Secondary | ICD-10-CM | POA: Diagnosis not present

## 2014-10-08 DIAGNOSIS — Z794 Long term (current) use of insulin: Secondary | ICD-10-CM | POA: Diagnosis not present

## 2014-10-08 DIAGNOSIS — K029 Dental caries, unspecified: Secondary | ICD-10-CM | POA: Diagnosis present

## 2014-10-08 DIAGNOSIS — F17210 Nicotine dependence, cigarettes, uncomplicated: Secondary | ICD-10-CM | POA: Diagnosis not present

## 2014-10-08 DIAGNOSIS — I10 Essential (primary) hypertension: Secondary | ICD-10-CM | POA: Diagnosis not present

## 2014-10-08 DIAGNOSIS — Z7982 Long term (current) use of aspirin: Secondary | ICD-10-CM | POA: Diagnosis not present

## 2014-10-08 DIAGNOSIS — M270 Developmental disorders of jaws: Secondary | ICD-10-CM | POA: Diagnosis not present

## 2014-10-08 DIAGNOSIS — Z6822 Body mass index (BMI) 22.0-22.9, adult: Secondary | ICD-10-CM | POA: Diagnosis not present

## 2014-10-08 DIAGNOSIS — E785 Hyperlipidemia, unspecified: Secondary | ICD-10-CM | POA: Diagnosis not present

## 2014-10-08 DIAGNOSIS — E119 Type 2 diabetes mellitus without complications: Secondary | ICD-10-CM | POA: Diagnosis not present

## 2014-10-08 HISTORY — DX: Gastro-esophageal reflux disease without esophagitis: K21.9

## 2014-10-08 HISTORY — DX: Unspecified cirrhosis of liver: K74.60

## 2014-10-08 HISTORY — DX: Esophageal varices without bleeding: I85.00

## 2014-10-08 LAB — COMPREHENSIVE METABOLIC PANEL
ALBUMIN: 3.4 g/dL — AB (ref 3.5–5.0)
ALT: 25 U/L (ref 17–63)
ANION GAP: 10 (ref 5–15)
AST: 51 U/L — ABNORMAL HIGH (ref 15–41)
Alkaline Phosphatase: 152 U/L — ABNORMAL HIGH (ref 38–126)
BUN: 7 mg/dL (ref 6–20)
CHLORIDE: 101 mmol/L (ref 101–111)
CO2: 24 mmol/L (ref 22–32)
CREATININE: 0.74 mg/dL (ref 0.61–1.24)
Calcium: 9.7 mg/dL (ref 8.9–10.3)
GFR calc Af Amer: >60 mL/min (ref >60)
Glucose, Bld: 195 mg/dL — ABNORMAL HIGH (ref 65–99)
Potassium: 3.9 mmol/L (ref 3.5–5.1)
Sodium: 135 mmol/L (ref 135–145)
Total Bilirubin: 0.8 mg/dL (ref 0.3–1.2)
Total Protein: 7.4 g/dL (ref 6.5–8.1)

## 2014-10-08 LAB — CBC
HEMATOCRIT: 37.4 % — AB (ref 39.0–52.0)
Hemoglobin: 13.1 g/dL (ref 13.0–17.0)
MCH: 28.2 pg (ref 26.0–34.0)
MCHC: 35 g/dL (ref 30.0–36.0)
MCV: 80.6 fL (ref 78.0–100.0)
Platelets: 134 10*3/uL — ABNORMAL LOW (ref 150–400)
RBC: 4.64 MIL/uL (ref 4.22–5.81)
RDW: 18.7 % — ABNORMAL HIGH (ref 11.5–15.5)
WBC: 6 10*3/uL (ref 4.0–10.5)

## 2014-10-08 LAB — GLUCOSE, CAPILLARY: Glucose-Capillary: 225 mg/dL — ABNORMAL HIGH (ref 65–99)

## 2014-10-08 NOTE — H&P (Signed)
HISTORY AND PHYSICAL  Nicholas Becker is a 59 y.o. male patient referred by general dentist for multiple dental extractions.  No diagnosis found.  Past Medical History  Diagnosis Date  . Diabetes mellitus   . Hypertension     normal Bmet in 2012  . Hyperlipemia   . Chronic pancreatitis     calcified  . Tobacco abuse     1/2 pack per day  . Chronic hepatitis C with cirrhosis 2006    Secondary to hepatitis C and ethanol abuse, which is now in remission; varices-status post banding at [facility]; normal LFTs in 2009; thrombocytopenia In the past, platelets-153,000 in 2012  . Cholelithiasis 2009  . Bronchiectasis 2009    Left lower lobe; normal chest x-ray in 2012  . Cataracts, bilateral   . Overweight(278.02)   . 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 Nephrolithiasis 12/31/2010  . Substance abuse     Cocaine, marijuana, alcohol, stopped 10 yrs ago    No current facility-administered medications for this encounter.   Current Outpatient Prescriptions  Medication Sig Dispense Refill  . amLODipine (NORVASC) 5 MG tablet Take 5 mg by mouth every morning.     Marland Kitchen aspirin 81 MG chewable tablet Chew 81 mg by mouth every morning.    . Cholecalciferol (VITAMIN D) 2000 UNITS tablet Take 2,000 Units by mouth every morning.     . citalopram (CELEXA) 40 MG tablet Take 40 mg by mouth every morning.     . insulin detemir (LEVEMIR) 100 UNIT/ML injection Inject 41 Units into the skin every morning.     . insulin lispro (HUMALOG) 100 UNIT/ML injection Inject 5 Units into the skin 3 (three) times daily before meals.    Marland Kitchen lisinopril-hydrochlorothiazide (PRINZIDE,ZESTORETIC) 20-12.5 MG per tablet Take 2 tablets by mouth every morning.     . metFORMIN (GLUCOPHAGE) 1000 MG tablet Take 1,000 mg by mouth 2 (two) times daily with a meal.     . Multiple Vitamin (MULTIVITAMIN) tablet Take 1 tablet by mouth every morning.     . nadolol (CORGARD) 80 MG tablet Take 80 mg by mouth every morning.     . Omega-3 Fatty  Acids (FISH OIL) 1000 MG CAPS Take 1,000 mg by mouth every morning.     Marland Kitchen omeprazole (PRILOSEC) 20 MG capsule Take 20 mg by mouth every morning.     . simvastatin (ZOCOR) 10 MG tablet Take 10 mg by mouth at bedtime.     . traZODone (DESYREL) 150 MG tablet Take 75 mg by mouth at bedtime.     . ondansetron (ZOFRAN) 4 MG tablet Take 1 tablet (4 mg total) by mouth every 8 (eight) hours as needed for nausea or vomiting. (Patient not taking: Reported on 10/07/2014) 20 tablet 0  . oxyCODONE (ROXICODONE) 5 MG immediate release tablet Take 1 tablet (5 mg total) by mouth every 4 (four) hours as needed for severe pain. (Patient not taking: Reported on 10/07/2014) 30 tablet 0   No Known Allergies Active Problems:   * No active hospital problems. *  Vitals: There were no vitals taken for this visit. Lab results:No results found for this or any previous visit (from the past 41 hour(s)). Radiology Results: No results found. General appearance: alert and cooperative Head: Normocephalic, without obvious abnormality, atraumatic Eyes: negative Nose: Nares normal. Septum midline. Mucosa normal. No drainage or sinus tenderness. Throat: multiple carious teeth. Pharynx clear. Bilateral lingual tori. Neck: no adenopathy, supple, symmetrical, trachea midline and  thyroid not enlarged, symmetric, no tenderness/mass/nodules Resp: clear to auscultation bilaterally Cardio: regular rate and rhythm, S1, S2 normal, no murmur, click, rub or gallop  Assessment: Multiple carious teeth. Bilateral mandibular lingual tori.  Plan: Extraction Multiple carious teeth. Removal Bilateral mandibular lingual tori. General anesthesia. Day surgery.  [name] 10/08/2014

## 2014-10-08 NOTE — Pre-Procedure Instructions (Signed)
Nicholas Becker  10/08/2014      Your procedure is scheduled on: Friday October 11, 2014 at 8: 30 AM.  Report to [facility] Admitting at 6:30 A.M.  Call this number if you have problems the morning of surgery: [phone]    Remember:  Do not eat food or drink liquids after midnight.  Take these medicines the morning of surgery with A SIP OF WATER: Amlodipine (Norvasc), Citalopram (Celexa), Nadolol (Corgard), Omeprazole (Prilosec)   Stop taking any vitamins, Fish oil, Ibuprofen, Advil, Motrin, Aleve, herbal medications, etc   Take only 20 units of Levemir insulin the morning of your surgery   Check blood sugar the morning of your surgery IF sugar is more than 220 take half dose of Humalog (Ex. 2 units of Humalog)   Do NOT take any diabetic pills the morning of your surgery (Ex. Metformin/Glucophage)   Do not wear jewelry.  Do not wear lotions, powders, or cologne.    Men may shave face and neck.  Do not bring valuables to the hospital.  [facility] is not responsible for any belongings or valuables.  Contacts, dentures or bridgework may not be worn into surgery.  Leave your suitcase in the car.  After surgery it may be brought to your room.  For patients admitted to the hospital, discharge time will be determined by your treatment team.  Patients discharged the day of surgery will not be allowed to drive home.   Name and phone number of your driver:    Special instructions: Shower using CHG soap the night before and the morning of your surgery  Please read over the following fact sheets that you were given. Pain Booklet, Coughing and Deep Breathing and Surgical Site Infection Prevention

## 2014-10-09 LAB — HEMOGLOBIN A1C
Hgb A1c MFr Bld: 7.3 % — ABNORMAL HIGH (ref 4.8–5.6)
Mean Plasma Glucose: 163 mg/dL

## 2014-10-10 MED ORDER — CEFAZOLIN SODIUM-DEXTROSE 2-3 GM-% IV SOLR
2.0000 g | INTRAVENOUS | Status: AC
  Administered 2014-10-11: 2 g via INTRAVENOUS
  Filled 2014-10-10: qty 50

## 2014-10-11 ENCOUNTER — Ambulatory Visit (HOSPITAL_COMMUNITY)
Admission: RE | Admit: 2014-10-11 | Discharge: 2014-10-11 | Disposition: A | Discharge status: Home / Self Care | Payer: Medicare Other | Source: Ambulatory Visit | Attending: Oral Surgery | Admitting: Oral Surgery

## 2014-10-11 ENCOUNTER — Encounter (HOSPITAL_COMMUNITY): Payer: Self-pay | Admitting: *Deleted

## 2014-10-11 ENCOUNTER — Ambulatory Visit (HOSPITAL_COMMUNITY): Payer: Medicare Other | Admitting: Certified Registered Nurse Anesthetist

## 2014-10-11 ENCOUNTER — Encounter (HOSPITAL_COMMUNITY)
Admission: RE | Disposition: A | Discharge status: Home / Self Care | Payer: Self-pay | Source: Ambulatory Visit | Attending: Oral Surgery

## 2014-10-11 ENCOUNTER — Ambulatory Visit (HOSPITAL_COMMUNITY): Payer: Medicare Other | Admitting: Emergency Medicine

## 2014-10-11 DIAGNOSIS — E119 Type 2 diabetes mellitus without complications: Secondary | ICD-10-CM | POA: Diagnosis not present

## 2014-10-11 DIAGNOSIS — K7460 Unspecified cirrhosis of liver: Secondary | ICD-10-CM | POA: Insufficient documentation

## 2014-10-11 DIAGNOSIS — E663 Overweight: Secondary | ICD-10-CM | POA: Insufficient documentation

## 2014-10-11 DIAGNOSIS — K029 Dental caries, unspecified: Secondary | ICD-10-CM | POA: Insufficient documentation

## 2014-10-11 DIAGNOSIS — E785 Hyperlipidemia, unspecified: Secondary | ICD-10-CM | POA: Insufficient documentation

## 2014-10-11 DIAGNOSIS — I10 Essential (primary) hypertension: Secondary | ICD-10-CM | POA: Diagnosis not present

## 2014-10-11 DIAGNOSIS — Z794 Long term (current) use of insulin: Secondary | ICD-10-CM | POA: Insufficient documentation

## 2014-10-11 DIAGNOSIS — Z6822 Body mass index (BMI) 22.0-22.9, adult: Secondary | ICD-10-CM | POA: Insufficient documentation

## 2014-10-11 DIAGNOSIS — B182 Chronic viral hepatitis C: Secondary | ICD-10-CM | POA: Insufficient documentation

## 2014-10-11 DIAGNOSIS — Z7982 Long term (current) use of aspirin: Secondary | ICD-10-CM | POA: Insufficient documentation

## 2014-10-11 DIAGNOSIS — M270 Developmental disorders of jaws: Secondary | ICD-10-CM | POA: Diagnosis not present

## 2014-10-11 DIAGNOSIS — F17210 Nicotine dependence, cigarettes, uncomplicated: Secondary | ICD-10-CM | POA: Insufficient documentation

## 2014-10-11 HISTORY — PX: MULTIPLE EXTRACTIONS WITH ALVEOLOPLASTY: SHX5342

## 2014-10-11 LAB — GLUCOSE, CAPILLARY
Glucose-Capillary: 162 mg/dL — ABNORMAL HIGH (ref 65–99)
Glucose-Capillary: 168 mg/dL — ABNORMAL HIGH (ref 65–99)

## 2014-10-11 SURGERY — MULTIPLE EXTRACTION WITH ALVEOLOPLASTY
Anesthesia: General | Site: Mouth

## 2014-10-11 MED ORDER — HYDROMORPHONE HCL 1 MG/ML IJ SOLN
INTRAMUSCULAR | Status: AC
  Filled 2014-10-11: qty 1

## 2014-10-11 MED ORDER — FENTANYL CITRATE (PF) 100 MCG/2ML IJ SOLN
INTRAMUSCULAR | Status: DC | PRN
  Administered 2014-10-11: 100 ug via INTRAVENOUS
  Administered 2014-10-11: 50 ug via INTRAVENOUS

## 2014-10-11 MED ORDER — LIDOCAINE-EPINEPHRINE 2 %-1:100000 IJ SOLN
INTRAMUSCULAR | Status: DC | PRN
  Administered 2014-10-11: 20 mL via INTRADERMAL

## 2014-10-11 MED ORDER — MIDAZOLAM HCL 5 MG/5ML IJ SOLN
INTRAMUSCULAR | Status: DC | PRN
  Administered 2014-10-11: 2 mg via INTRAVENOUS

## 2014-10-11 MED ORDER — LIDOCAINE HCL (CARDIAC) 20 MG/ML IV SOLN
INTRAVENOUS | Status: DC | PRN
  Administered 2014-10-11: 60 mg via INTRAVENOUS

## 2014-10-11 MED ORDER — 0.9 % SODIUM CHLORIDE (POUR BTL) OPTIME
TOPICAL | Status: DC | PRN
  Administered 2014-10-11: 1000 mL

## 2014-10-11 MED ORDER — LIDOCAINE HCL (CARDIAC) 20 MG/ML IV SOLN
INTRAVENOUS | Status: AC
  Filled 2014-10-11: qty 5

## 2014-10-11 MED ORDER — AMOXICILLIN 500 MG PO CAPS: 500.0000 mg | ORAL_CAPSULE | Freq: Three times a day (TID) | ORAL | Status: DC

## 2014-10-11 MED ORDER — OXYCODONE HCL 5 MG PO TABS: 5.0000 mg | ORAL_TABLET | ORAL | Status: DC | PRN

## 2014-10-11 MED ORDER — LACTATED RINGERS IV SOLN
INTRAVENOUS | Status: DC
  Administered 2014-10-11 (×2): via INTRAVENOUS

## 2014-10-11 MED ORDER — FENTANYL CITRATE (PF) 250 MCG/5ML IJ SOLN
INTRAMUSCULAR | Status: AC
  Filled 2014-10-11: qty 5

## 2014-10-11 MED ORDER — ONDANSETRON HCL 4 MG/2ML IJ SOLN
INTRAMUSCULAR | Status: DC | PRN
  Administered 2014-10-11: 4 mg via INTRAVENOUS

## 2014-10-11 MED ORDER — HYDROMORPHONE HCL 1 MG/ML IJ SOLN
0.2500 mg | INTRAMUSCULAR | Status: DC | PRN
  Administered 2014-10-11 (×2): 0.5 mg via INTRAVENOUS

## 2014-10-11 MED ORDER — PROPOFOL 10 MG/ML IV BOLUS
INTRAVENOUS | Status: DC | PRN
  Administered 2014-10-11: 150 mg via INTRAVENOUS

## 2014-10-11 MED ORDER — DEXAMETHASONE SODIUM PHOSPHATE 4 MG/ML IJ SOLN
INTRAMUSCULAR | Status: DC | PRN
  Administered 2014-10-11: 4 mg via INTRAVENOUS

## 2014-10-11 MED ORDER — ONDANSETRON HCL 4 MG/2ML IJ SOLN: 4.0000 mg | Freq: Once | INTRAMUSCULAR | Status: DC | PRN

## 2014-10-11 MED ORDER — SUCCINYLCHOLINE CHLORIDE 20 MG/ML IJ SOLN
INTRAMUSCULAR | Status: DC | PRN
  Administered 2014-10-11: 120 mg via INTRAVENOUS

## 2014-10-11 MED ORDER — OXYMETAZOLINE HCL 0.05 % NA SOLN
NASAL | Status: DC | PRN
  Administered 2014-10-11: 1

## 2014-10-11 MED ORDER — ONDANSETRON HCL 4 MG/2ML IJ SOLN
INTRAMUSCULAR | Status: AC
  Filled 2014-10-11: qty 2

## 2014-10-11 MED ORDER — SODIUM CHLORIDE 0.9 % IR SOLN
Status: DC | PRN
  Administered 2014-10-11: 1000 mL

## 2014-10-11 MED ORDER — MIDAZOLAM HCL 2 MG/2ML IJ SOLN
INTRAMUSCULAR | Status: AC
  Filled 2014-10-11: qty 2

## 2014-10-11 MED ORDER — EPHEDRINE SULFATE 50 MG/ML IJ SOLN
INTRAMUSCULAR | Status: DC | PRN
  Administered 2014-10-11 (×2): 10 mg via INTRAVENOUS

## 2014-10-11 SURGICAL SUPPLY — 26 items
BUR CROSS CUT FISSURE 1.6 (BURR) ×2 IMPLANT
BUR EGG ELITE 4.0 (BURR) ×2 IMPLANT
CANISTER SUCTION 2500CC (MISCELLANEOUS) ×2 IMPLANT
COVER SURGICAL LIGHT HANDLE (MISCELLANEOUS) ×2 IMPLANT
CRADLE DONUT ADULT HEAD (MISCELLANEOUS) ×2 IMPLANT
FLUID NSS /IRRIG 1000 ML XXX (MISCELLANEOUS) ×2 IMPLANT
GAUZE PACKING FOLDED 2  STR (GAUZE/BANDAGES/DRESSINGS) ×1
GAUZE PACKING FOLDED 2 STR (GAUZE/BANDAGES/DRESSINGS) ×1 IMPLANT
GLOVE BIO SURGEON STRL SZ 6.5 (GLOVE) ×2 IMPLANT
GLOVE BIO SURGEON STRL SZ7.5 (GLOVE) ×2 IMPLANT
GLOVE BIOGEL PI IND STRL 7.0 (GLOVE) ×1 IMPLANT
GLOVE BIOGEL PI INDICATOR 7.0 (GLOVE) ×1
GOWN STRL REUS W/ TWL LRG LVL3 (GOWN DISPOSABLE) ×1 IMPLANT
GOWN STRL REUS W/ TWL XL LVL3 (GOWN DISPOSABLE) ×1 IMPLANT
GOWN STRL REUS W/TWL LRG LVL3 (GOWN DISPOSABLE) ×1
GOWN STRL REUS W/TWL XL LVL3 (GOWN DISPOSABLE) ×1
KIT BASIN OR (CUSTOM PROCEDURE TRAY) ×2 IMPLANT
KIT ROOM TURNOVER OR (KITS) ×2 IMPLANT
NEEDLE 22X1 1/2 (OR ONLY) (NEEDLE) ×4 IMPLANT
NS IRRIG 1000ML POUR BTL (IV SOLUTION) ×2 IMPLANT
PAD ARMBOARD 7.5X6 YLW CONV (MISCELLANEOUS) ×2 IMPLANT
SUT CHROMIC 3 0 PS 2 (SUTURE) ×4 IMPLANT
SYR CONTROL 10ML LL (SYRINGE) ×2 IMPLANT
TRAY ENT MC OR (CUSTOM PROCEDURE TRAY) ×2 IMPLANT
TUBING IRRIGATION (MISCELLANEOUS) ×2 IMPLANT
YANKAUER SUCT BULB TIP NO VENT (SUCTIONS) ×2 IMPLANT

## 2014-10-11 NOTE — H&P (Signed)
H&P documentation  -History and Physical Reviewed  -Patient has been re-examined  -No change in the plan of care  Nicholas Becker  

## 2014-10-11 NOTE — Anesthesia Postprocedure Evaluation (Signed)
  Anesthesia Post-op Note  Patient: Nicholas Becker  Procedure(s) Performed: Procedure(s): MULTIPLE EXTRACTIONS: Teeth # 1, 4, 6, 9, 10, 11, 12, 20, 28,  WITH ALVEOLOPLASTY AND REMOVAL OF BILATREAL MANDIBULAR TORI (N/A)  Patient Location: PACU  Anesthesia Type:General  Level of Consciousness: awake, alert  and oriented  Airway and Oxygen Therapy: Patient Spontanous Breathing and Patient connected to nasal cannula oxygen  Post-op Pain: mild  Post-op Assessment: Post-op Vital signs reviewed, Patient's Cardiovascular Status Stable, Respiratory Function Stable, Patent Airway and Pain level controlled              Post-op Vital Signs: stable  Last Vitals:  Filed Vitals:   10/11/14 1129  BP: 105/59  Pulse: 56  Temp:   Resp: 15    Complications: No apparent anesthesia complications

## 2014-10-11 NOTE — Op Note (Signed)
10/11/2014  9:46 AM  PATIENT:  Nicholas Becker  59 y.o. male  PRE-OPERATIVE DIAGNOSIS:  NON RESTORABLE TEETH # 1, 4, 6, 9, 10, 11, 12, 20, 28; BILATERAL MANDIBULAR LINGUAL TORI POST-OPERATIVE DIAGNOSIS:  SAME + TOOTH #12 NOT PRESENT  PROCEDURE:  Procedure(s): MULTIPLE EXTRACTIONS: Teeth # 1, 4, 6, 9, 10, 11, 20, 28,  WITH ALVEOLOPLASTY; REMOVAL OF BILATREAL MANDIBULAR TORI  SURGEON:  Surgeon(s): Diona Browner, DDS  ANESTHESIA:   local and general  EBL:  minimal  DRAINS: none   SPECIMEN:  No Specimen  COUNTS:  YES  PLAN OF CARE: Discharge to home after PACU  PATIENT DISPOSITION:  PACU - hemodynamically stable.   PROCEDURE DETAILS: Dictation # [id number]  [name], DMD 10/11/2014 9:46 AM

## 2014-10-11 NOTE — Anesthesia Procedure Notes (Signed)
Procedure Name: Intubation Date/Time: 10/11/2014 9:12 AM Performed by: Ollen Bowl Pre-anesthesia Checklist: Patient identified, Emergency Drugs available, Suction available, Patient being monitored and Timeout performed Patient Re-evaluated:Patient Re-evaluated prior to inductionOxygen Delivery Method: Circle system utilized and Simple face mask Preoxygenation: Pre-oxygenation with 100% oxygen Intubation Type: IV induction Ventilation: Mask ventilation without difficulty Laryngoscope Size: Mac and 3 Grade View: Grade I Nasal Tubes: Nasal Rae, Magill forceps- large, utilized, Nasal prep performed and Left Tube size: 7.0 mm Number of attempts: 1 Airway Equipment and Method: Patient positioned with wedge pillow,  Stylet and Bougie stylet Placement Confirmation: ETT inserted through vocal cords under direct vision,  positive ETCO2 and breath sounds checked- equal and bilateral Tube secured with: Tape Dental Injury: Teeth and Oropharynx as per pre-operative assessment

## 2014-10-11 NOTE — Anesthesia Preprocedure Evaluation (Addendum)
Anesthesia Evaluation  Patient identified by MRN, date of birth, ID band Patient awake    Reviewed: Allergy & Precautions, NPO status , Patient's Chart, lab work & pertinent test results  Airway Mallampati: II  TM Distance: >3 FB Neck ROM: Full    Dental  (+) Dental Advisory Given, Poor Dentition   Pulmonary Current Smoker,  breath sounds clear to auscultation        Cardiovascular hypertension, Pt. on medications Rhythm:Regular Rate:Normal     Neuro/Psych    GI/Hepatic GERD-  ,(+) Hepatitis -  Endo/Other  diabetes, Type 2  Renal/GU      Musculoskeletal   Abdominal   Peds  Hematology   Anesthesia Other Findings   Reproductive/Obstetrics                           Anesthesia Physical Anesthesia Plan  ASA: III  Anesthesia Plan: General   Post-op Pain Management:    Induction: Intravenous  Airway Management Planned: Nasal ETT  Additional Equipment:   Intra-op Plan:   Post-operative Plan: Extubation in OR  Informed Consent: I have reviewed the patients History and Physical, chart, labs and discussed the procedure including the risks, benefits and alternatives for the proposed anesthesia with the patient or authorized representative who has indicated his/her understanding and acceptance.     Plan Discussed with: CRNA and Anesthesiologist  Anesthesia Plan Comments:         Anesthesia Quick Evaluation

## 2014-10-11 NOTE — Transfer of Care (Signed)
Immediate Anesthesia Transfer of Care Note  Patient: Nicholas Becker  Procedure(s) Performed: Procedure(s): MULTIPLE EXTRACTIONS: Teeth # 1, 4, 6, 9, 10, 11, 12, 20, 28,  WITH ALVEOLOPLASTY AND REMOVAL OF BILATREAL MANDIBULAR TORI (N/A)  Patient Location: PACU  Anesthesia Type:General  Level of Consciousness: awake and alert   Airway & Oxygen Therapy: Patient Spontanous Breathing and Patient connected to face mask oxygen  Post-op Assessment: Report given to RN, Post -op Vital signs reviewed and stable and Patient moving all extremities X 4  Post vital signs: Reviewed and stable  Last Vitals:  Filed Vitals:   10/11/14 0651  BP: 132/69  Pulse: 58  Temp: 36.9 C  Resp: 20    Complications: No apparent anesthesia complications

## 2014-10-12 NOTE — Op Note (Signed)
NAME:  Nicholas Becker, Nicholas Becker             ACCOUNT NO.:  [id number]  MEDICAL RECORD NO.:  [id number]  LOCATION:  [facility]                         FACILITY:  [facility]  PHYSICIAN:  [name], M.D.  DATE OF BIRTH:  [birth year]  DATE OF PROCEDURE:  10/11/2014 DATE OF DISCHARGE:  10/11/2014                              OPERATIVE REPORT   PREOPERATIVE DIAGNOSIS:  Nonrestorable teeth #1, 4, 6, 9, 10, 11, 12, 20, 28, bilateral mandibular lingual tori.  POSTOPERATIVE DIAGNOSIS:  Nonrestorable teeth #1, 4, 6, 9, 10, 11, 12, 20, 28, bilateral mandibular lingual tori plus tooth #12 was not present.  PROCEDURE:  Extraction of teeth #1, 4, 6, 9, 10, 11, 20, 28, alveoplasty, right and left maxilla, removal of bilateral mandibular lingual tori.  SURGEON:  [name], M.D.  ANESTHESIA:  General nasal intubation.  Dr. Linna Caprice, attending.  DESCRIPTION OF PROCEDURE:  The patient was taken to the operating room, placed on the table in supine position.  General anesthesia was administered intravenously and a nasal endotracheal tube was placed and secured.  The eyes were protected.  The patient was draped for the procedure and then time-out was performed.  The posterior pharynx was suctioned and a throat pack was placed.  A 2% lidocaine with 1:100,000 epinephrine was infiltrated in inferior alveolar block on the right and left sides, and then buccal and palatal infiltration in the maxilla. Total of 20 mL was utilized.  A bite block was placed in the right side of the mouth and a sweetheart retractor was used to retract the tongue. A #15 blade was used to make an incision on the alveolar crest in the area formally located by tooth #19.  The incision was carried forward approximately 1 cm and then went buccally and lingually around tooth #21 and then the incision was carried forward lingually to the midline, then in the maxilla.  A 15 blade was used to make an incision around teeth #9, 10, 11, and carried  posteriorly on the alveolar crest approximately 1 cm.  The periosteum was reflected in the maxilla and mandible on the left side.  In the maxilla, tooth #12 was thought to be present on the radiograph, but it was not noted under direct visualization.  Teeth #9, 10, 11 were elevated and removed from the mouth with a dental forceps. The sockets were curetted.  Then, the alveoplasty was performed using the egg-shaped burr and bone file.  In the mandible, a Seldin retractor was used to retract the lingual tissues and 2 small tori or exostoses were removed using the egg-shaped burr and bone file.  Then, the bone was further smoothed with a bone file and both the left maxilla and left mandible and the areas were irrigated and closed with 3-0 chromic.  The bite block and sweetheart retractor were repositioned to the other side of the mouth and a 15 blade was used to make an incision beginning at tooth #1 in the maxilla carrying forward around tooth #4 until tooth #6 was encountered and then continued to the midline on the alveolar crest. In the mandible, incision was begun in the midline on the lingual aspect, carried distally to tooth #28 and  then the incision was carried posteriorly for approximately 1 cm.  The periosteum was reflected in the maxilla to expose the entire alveolar crest and in the mandible lingually to expose the mandibular tori.  Then, a 301 elevator was used to elevate the teeth.  The upper teeth were removed with the dental forceps as was tooth #28.  The sockets were then curetted and then alveoplasty was performed in the left maxilla using egg-shaped burr and bone file, and then the lingual tori were removed using the egg-shaped burr and bone file as well.  Then, the areas were irrigated and closed with 3-0 chromic.  The oral cavity was inspected, found to have good contour, hemostasis, and closure.  Oral cavity was irrigated and suctioned.  The throat pack was removed.  The  patient was awakened, taken to recovery room, breathing spontaneously in good condition.  ESTIMATED BLOOD LOSS:  Minimal.  COMPLICATIONS:  None.  SPECIMENS:  None.     [name], M.D.     SMJ/MEDQ  D:  10/11/2014  T:  10/12/2014  Job:  [id number]

## 2014-10-14 ENCOUNTER — Encounter (HOSPITAL_COMMUNITY): Payer: Self-pay | Admitting: Oral Surgery

## 2014-10-23 ENCOUNTER — Encounter (INDEPENDENT_AMBULATORY_CARE_PROVIDER_SITE_OTHER): Payer: Self-pay | Admitting: *Deleted

## 2014-10-30 ENCOUNTER — Telehealth (INDEPENDENT_AMBULATORY_CARE_PROVIDER_SITE_OTHER): Payer: Self-pay | Admitting: *Deleted

## 2014-10-30 NOTE — Telephone Encounter (Signed)
Nicholas Becker would like to speak with Dr. Laural Golden about Nicholas Becker's current condition and treatment plan. From 8 to 5, please call his office at [phone] then after that, please call his cell number [phone]. POA paper work has been scanned into the computer and he is one of the names listed.

## 2014-10-30 NOTE — Telephone Encounter (Signed)
Call returned. He will call Dr. Hanley Seamen office to find out when treatment would be scheduled.

## 2014-10-30 NOTE — Telephone Encounter (Signed)
Forwarded to Dr.Rehman to address per [facility] request.

## 2014-11-12 ENCOUNTER — Encounter (INDEPENDENT_AMBULATORY_CARE_PROVIDER_SITE_OTHER): Payer: Self-pay | Admitting: Internal Medicine

## 2014-11-12 ENCOUNTER — Ambulatory Visit (INDEPENDENT_AMBULATORY_CARE_PROVIDER_SITE_OTHER): Payer: Medicare Other | Admitting: Internal Medicine

## 2014-11-12 VITALS — BP 94/66 | HR 64 | Temp 98.3°F | Ht 64.0 in | Wt 126.1 lb

## 2014-11-12 DIAGNOSIS — C220 Liver cell carcinoma: Secondary | ICD-10-CM

## 2014-11-12 NOTE — Progress Notes (Signed)
Subjective:    Patient ID: Nicholas Becker, male    DOB: [birth year], 57 y.o.   MRN: [id number]  HPI Here today for f/u of his [place]. Hx of Hepatitis C. 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 He was successfully treated about 8 yrs ago. He was found to have [place] on screening. He underwent percutaneious blation of lesion in segment 4 and 5 in September 2015 and chemotherapy embolization of lesion in segment 2 in October 2015. He responded nicely with drop in AFP levels. He was last seen by Dr Laural Golden IN April of this year. He underwent an MRI and referred to Dr. Monica Martinez. Per Dr. Monica Martinez notes patient has advanced [place] with left portal vein thrombosis, so surgical options are not available to him. They recommended TARE versus sorafenib tx.   Has appt with Oncologist in  August at [facility]. He tells me he is doing good. He recently had teeth pulled 10/08/2014. Upper and lower dentures. Weight in April was 07/2014 was 132. Today his weight is 126.1. He denies any abdominal pain.  He is having a BM daily. No melena or BRRB.  Appetite is good. He has lost from 132 in April to 126.1 today.    10/04/2014 ALP 260, AST 41, ALT 23 H and H 12.6 and 38.5  AFP was 619.4 on 10/12/2013  AFP was 11370 on 11/29/2013 AFP was 718.6 on 01/30/2014. AFP was 512.2 on 04/01/2014 AFP was 954.9 on 05/28/2014       08/20/2014 MRI Abdomen: rising AFP, Hepatitis C.  IMPRESSION: 1. Similar appearance of treatment changes of ablation within segment 4/5 and chemo embolization within segment 2. No convincing evidence of locally recurrent disease at either site. 2. Progressive thrombus within the left portal vein with surrounding signal abnormality and venous enlargement. Findings are highly suspicious for malignant thrombus and direct tumor involvement of the surrounding central left hepatic lobe. Although some of the more diffuse hypo enhancement could be due to venous thrombosis, the extent of T2  hyperintensity and restricted diffusion strongly suggests central tumor. 3. Similar appearance of left lower lobe process which is likely due to prior infection, superimposed upon bullous disease. 4. Chronic pancreatitis. 5. Cholelithiasis. 6. Mild motion degradation.     Review of Systems Past Medical History  Diagnosis Date  . Diabetes mellitus   . Hypertension     normal Bmet in 2012  . Hyperlipemia   . Chronic pancreatitis     calcified  . Tobacco abuse     1/2 pack per day  . Cholelithiasis 2009  . Bronchiectasis 2009    Left lower lobe; normal chest x-ray in 2012  . Cataracts, bilateral   . Overweight(278.02)   . 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 Nephrolithiasis 12/31/2010  . Substance abuse     Cocaine, marijuana, alcohol, stopped 10 yrs ago  . Pancreatitis   . GERD (gastroesophageal reflux disease)   . Chronic hepatitis C with cirrhosis 2006    Secondary to hepatitis C and ethanol abuse, which is now in remission; varices-status post banding at [facility]; normal LFTs in 2009; thrombocytopenia In the past, platelets-153,000 in 2012  . Cirrhosis   . Esophageal varices     Past Surgical History  Procedure Laterality Date  . Percutaneous liver biopsy  2006  . Multiple extractions with alveoloplasty N/A 10/11/2014    Procedure: MULTIPLE EXTRACTIONS: Teeth # 1, 4, 6, 9, 10, 11, 12, 20, 28,  WITH ALVEOLOPLASTY  AND REMOVAL OF BILATREAL MANDIBULAR TORI;  Surgeon: Diona Browner, DDS;  Location: [place];  Service: Oral Surgery;  Laterality: N/A;    No Known Allergies  Current Outpatient Prescriptions on File Prior to Visit  Medication Sig Dispense Refill  . amLODipine (NORVASC) 5 MG tablet Take 5 mg by mouth every morning.     Marland Kitchen aspirin 81 MG chewable tablet Chew 81 mg by mouth every morning.    . Cholecalciferol (VITAMIN D) 2000 UNITS tablet Take 2,000 Units by mouth every morning.     . citalopram (CELEXA) 40 MG tablet Take 40 mg by mouth every morning.     .  insulin detemir (LEVEMIR) 100 UNIT/ML injection Inject 41 Units into the skin every morning.     . insulin lispro (HUMALOG) 100 UNIT/ML injection Inject 5 Units into the skin 3 (three) times daily before meals.    Marland Kitchen lisinopril-hydrochlorothiazide (PRINZIDE,ZESTORETIC) 20-12.5 MG per tablet Take 2 tablets by mouth every morning.     . metFORMIN (GLUCOPHAGE) 1000 MG tablet Take 1,000 mg by mouth 2 (two) times daily with a meal.     . Multiple Vitamin (MULTIVITAMIN) tablet Take 1 tablet by mouth every morning.     . nadolol (CORGARD) 80 MG tablet Take 80 mg by mouth every morning.     . Omega-3 Fatty Acids (FISH OIL) 1000 MG CAPS Take 1,000 mg by mouth every morning.     Marland Kitchen omeprazole (PRILOSEC) 20 MG capsule Take 20 mg by mouth every morning.     . simvastatin (ZOCOR) 10 MG tablet Take 10 mg by mouth at bedtime.     . traZODone (DESYREL) 150 MG tablet Take 75 mg by mouth at bedtime.      No current facility-administered medications on file prior to visit.        Objective:   Physical Exam Blood pressure 94/66, pulse 64, temperature 98.3 F (36.8 C), height 5\' 4"  (1.626 m), weight 126 lb 1.6 oz (57.199 kg).  Alert and oriented. Skin warm and dry. Oral mucosa is moist.   . Sclera anicteric, conjunctivae is pink. Thyroid not enlarged. No cervical lymphadenopathy. Lungs clear. Heart regular rate and rhythm.  Abdomen is soft. Bowel sounds are positive. No hepatomegaly. No abdominal masses felt. No tenderness.  No edema to lower extremities.        Assessment & Plan:  Advanced [place]. Has apt with Oncology in August. Will see back in September.

## 2014-11-15 ENCOUNTER — Encounter (INDEPENDENT_AMBULATORY_CARE_PROVIDER_SITE_OTHER): Payer: Self-pay

## 2014-11-29 ENCOUNTER — Encounter (INDEPENDENT_AMBULATORY_CARE_PROVIDER_SITE_OTHER): Payer: Self-pay

## 2014-12-09 ENCOUNTER — Encounter (HOSPITAL_COMMUNITY): Payer: Self-pay | Admitting: Emergency Medicine

## 2014-12-09 ENCOUNTER — Inpatient Hospital Stay (HOSPITAL_COMMUNITY)
Admission: EM | Admit: 2014-12-09 | Discharge: 2014-12-10 | DRG: 641 | Disposition: A | Discharge status: Home / Self Care | Payer: Medicare Other | Attending: Internal Medicine | Admitting: Internal Medicine

## 2014-12-09 DIAGNOSIS — E876 Hypokalemia: Secondary | ICD-10-CM | POA: Diagnosis present

## 2014-12-09 DIAGNOSIS — B182 Chronic viral hepatitis C: Secondary | ICD-10-CM | POA: Diagnosis present

## 2014-12-09 DIAGNOSIS — Z801 Family history of malignant neoplasm of trachea, bronchus and lung: Secondary | ICD-10-CM

## 2014-12-09 DIAGNOSIS — E871 Hypo-osmolality and hyponatremia: Secondary | ICD-10-CM | POA: Diagnosis present

## 2014-12-09 DIAGNOSIS — E785 Hyperlipidemia, unspecified: Secondary | ICD-10-CM | POA: Diagnosis present

## 2014-12-09 DIAGNOSIS — Z833 Family history of diabetes mellitus: Secondary | ICD-10-CM | POA: Diagnosis not present

## 2014-12-09 DIAGNOSIS — Z720 Tobacco use: Secondary | ICD-10-CM | POA: Diagnosis present

## 2014-12-09 DIAGNOSIS — E119 Type 2 diabetes mellitus without complications: Secondary | ICD-10-CM

## 2014-12-09 DIAGNOSIS — F17210 Nicotine dependence, cigarettes, uncomplicated: Secondary | ICD-10-CM | POA: Diagnosis present

## 2014-12-09 DIAGNOSIS — K7460 Unspecified cirrhosis of liver: Secondary | ICD-10-CM | POA: Diagnosis present

## 2014-12-09 DIAGNOSIS — Z7982 Long term (current) use of aspirin: Secondary | ICD-10-CM | POA: Diagnosis not present

## 2014-12-09 DIAGNOSIS — E860 Dehydration: Secondary | ICD-10-CM | POA: Diagnosis present

## 2014-12-09 DIAGNOSIS — D638 Anemia in other chronic diseases classified elsewhere: Secondary | ICD-10-CM | POA: Diagnosis present

## 2014-12-09 DIAGNOSIS — I10 Essential (primary) hypertension: Secondary | ICD-10-CM | POA: Diagnosis present

## 2014-12-09 DIAGNOSIS — C220 Liver cell carcinoma: Secondary | ICD-10-CM | POA: Diagnosis present

## 2014-12-09 DIAGNOSIS — Z794 Long term (current) use of insulin: Secondary | ICD-10-CM

## 2014-12-09 LAB — CBC WITH DIFFERENTIAL/PLATELET
Basophils Absolute: 0 10*3/uL (ref 0.0–0.1)
Basophils Relative: 0 % (ref 0–1)
Eosinophils Absolute: 0.1 10*3/uL (ref 0.0–0.7)
Eosinophils Relative: 1 % (ref 0–5)
HCT: 32.8 % — ABNORMAL LOW (ref 39.0–52.0)
Hemoglobin: 11.4 g/dL — ABNORMAL LOW (ref 13.0–17.0)
LYMPHS PCT: 23 % (ref 12–46)
Lymphs Abs: 1.7 10*3/uL (ref 0.7–4.0)
MCH: 28.4 pg (ref 26.0–34.0)
MCHC: 34.8 g/dL (ref 30.0–36.0)
MCV: 81.8 fL (ref 78.0–100.0)
MONO ABS: 0.8 10*3/uL (ref 0.1–1.0)
MONOS PCT: 11 % (ref 3–12)
NEUTROS ABS: 4.7 10*3/uL (ref 1.7–7.7)
Neutrophils Relative %: 65 % (ref 43–77)
Platelets: 175 10*3/uL (ref 150–400)
RBC: 4.01 MIL/uL — ABNORMAL LOW (ref 4.22–5.81)
RDW: 16.9 % — AB (ref 11.5–15.5)
WBC: 7.3 10*3/uL (ref 4.0–10.5)

## 2014-12-09 LAB — MAGNESIUM: Magnesium: 0.9 mg/dL — CL (ref 1.7–2.4)

## 2014-12-09 LAB — COMPREHENSIVE METABOLIC PANEL
ALBUMIN: 3.1 g/dL — AB (ref 3.5–5.0)
ALT: 29 U/L (ref 17–63)
ANION GAP: 10 (ref 5–15)
AST: 79 U/L — AB (ref 15–41)
Alkaline Phosphatase: 222 U/L — ABNORMAL HIGH (ref 38–126)
BUN: 8 mg/dL (ref 6–20)
CO2: 25 mmol/L (ref 22–32)
Calcium: 11.6 mg/dL — ABNORMAL HIGH (ref 8.9–10.3)
Chloride: 95 mmol/L — ABNORMAL LOW (ref 101–111)
Creatinine, Ser: 0.72 mg/dL (ref 0.61–1.24)
GFR calc Af Amer: >60 mL/min (ref >60)
GFR calc non Af Amer: >60 mL/min (ref >60)
GLUCOSE: 331 mg/dL — AB (ref 65–99)
POTASSIUM: 3.3 mmol/L — AB (ref 3.5–5.1)
Sodium: 130 mmol/L — ABNORMAL LOW (ref 135–145)
Total Bilirubin: 0.7 mg/dL (ref 0.3–1.2)
Total Protein: 7.9 g/dL (ref 6.5–8.1)

## 2014-12-09 MED ORDER — ONDANSETRON HCL 4 MG/2ML IJ SOLN: 4.0000 mg | Freq: Four times a day (QID) | INTRAMUSCULAR | Status: DC | PRN

## 2014-12-09 MED ORDER — HYDROCHLOROTHIAZIDE 12.5 MG PO CAPS: 12.5000 mg | ORAL_CAPSULE | Freq: Every day | ORAL | Status: DC

## 2014-12-09 MED ORDER — INSULIN DETEMIR 100 UNIT/ML ~~LOC~~ SOLN
41.0000 [IU] | Freq: Every morning | SUBCUTANEOUS | Status: DC
  Filled 2014-12-09 (×2): qty 0.41

## 2014-12-09 MED ORDER — ASPIRIN 81 MG PO CHEW
81.0000 mg | CHEWABLE_TABLET | Freq: Every morning | ORAL | Status: DC
  Administered 2014-12-10: 81 mg via ORAL
  Filled 2014-12-09: qty 1

## 2014-12-09 MED ORDER — LISINOPRIL-HYDROCHLOROTHIAZIDE 20-12.5 MG PO TABS: 2.0000 | ORAL_TABLET | Freq: Every morning | ORAL | Status: DC

## 2014-12-09 MED ORDER — MAGNESIUM SULFATE 2 GM/50ML IV SOLN
2.0000 g | Freq: Once | INTRAVENOUS | Status: AC
  Administered 2014-12-09: 2 g via INTRAVENOUS
  Filled 2014-12-09: qty 50

## 2014-12-09 MED ORDER — AMLODIPINE BESYLATE 5 MG PO TABS
5.0000 mg | ORAL_TABLET | Freq: Every morning | ORAL | Status: DC
  Administered 2014-12-10: 5 mg via ORAL
  Filled 2014-12-09: qty 1

## 2014-12-09 MED ORDER — INSULIN ASPART 100 UNIT/ML ~~LOC~~ SOLN: 0.0000 [IU] | Freq: Every day | SUBCUTANEOUS | Status: DC

## 2014-12-09 MED ORDER — OMEGA-3-ACID ETHYL ESTERS 1 G PO CAPS
1.0000 g | ORAL_CAPSULE | Freq: Every day | ORAL | Status: DC
  Administered 2014-12-10: 1 g via ORAL
  Filled 2014-12-09: qty 1

## 2014-12-09 MED ORDER — SODIUM CHLORIDE 0.9 % IJ SOLN: 3.0000 mL | Freq: Two times a day (BID) | INTRAMUSCULAR | Status: DC

## 2014-12-09 MED ORDER — SODIUM CHLORIDE 0.9 % IV BOLUS (SEPSIS)
1000.0000 mL | Freq: Once | INTRAVENOUS | Status: AC
  Administered 2014-12-09: 1000 mL via INTRAVENOUS

## 2014-12-09 MED ORDER — CITALOPRAM HYDROBROMIDE 20 MG PO TABS
40.0000 mg | ORAL_TABLET | Freq: Every morning | ORAL | Status: DC
  Administered 2014-12-10: 40 mg via ORAL
  Filled 2014-12-09: qty 2

## 2014-12-09 MED ORDER — SIMVASTATIN 10 MG PO TABS
10.0000 mg | ORAL_TABLET | Freq: Every day | ORAL | Status: DC
  Administered 2014-12-10: 10 mg via ORAL
  Filled 2014-12-09 (×2): qty 1

## 2014-12-09 MED ORDER — INSULIN ASPART 100 UNIT/ML ~~LOC~~ SOLN
0.0000 [IU] | Freq: Three times a day (TID) | SUBCUTANEOUS | Status: DC
  Administered 2014-12-10: 3 [IU] via SUBCUTANEOUS
  Administered 2014-12-10: 8 [IU] via SUBCUTANEOUS

## 2014-12-09 MED ORDER — LISINOPRIL 10 MG PO TABS
20.0000 mg | ORAL_TABLET | Freq: Every day | ORAL | Status: DC
  Administered 2014-12-10: 20 mg via ORAL
  Filled 2014-12-09: qty 2

## 2014-12-09 MED ORDER — ENOXAPARIN SODIUM 40 MG/0.4ML ~~LOC~~ SOLN
40.0000 mg | SUBCUTANEOUS | Status: DC
  Administered 2014-12-10: 40 mg via SUBCUTANEOUS
  Filled 2014-12-09: qty 0.4

## 2014-12-09 MED ORDER — TRAZODONE HCL 50 MG PO TABS
75.0000 mg | ORAL_TABLET | Freq: Every day | ORAL | Status: DC
  Administered 2014-12-10: 75 mg via ORAL
  Filled 2014-12-09: qty 2

## 2014-12-09 MED ORDER — METFORMIN HCL 500 MG PO TABS: 1000.0000 mg | ORAL_TABLET | Freq: Two times a day (BID) | ORAL | Status: DC

## 2014-12-09 MED ORDER — SODIUM CHLORIDE 0.9 % IV SOLN
INTRAVENOUS | Status: DC
  Administered 2014-12-10: via INTRAVENOUS

## 2014-12-09 MED ORDER — PANTOPRAZOLE SODIUM 40 MG PO TBEC: 40.0000 mg | DELAYED_RELEASE_TABLET | Freq: Every day | ORAL | Status: DC

## 2014-12-09 MED ORDER — ONDANSETRON HCL 4 MG PO TABS: 4.0000 mg | ORAL_TABLET | Freq: Four times a day (QID) | ORAL | Status: DC | PRN

## 2014-12-09 MED ORDER — NADOLOL 40 MG PO TABS
80.0000 mg | ORAL_TABLET | Freq: Every morning | ORAL | Status: DC
  Administered 2014-12-10: 80 mg via ORAL
  Filled 2014-12-09 (×2): qty 1

## 2014-12-09 MED ORDER — PANTOPRAZOLE SODIUM 40 MG PO TBEC
40.0000 mg | DELAYED_RELEASE_TABLET | Freq: Every day | ORAL | Status: DC
  Administered 2014-12-10: 40 mg via ORAL
  Filled 2014-12-09: qty 1

## 2014-12-09 NOTE — H&P (Signed)
[facility] History and Physical  FUTURE YELDELL KYH:[id number] DOB: 1955-12-17 DOA: 12/09/2014  Referring physician: ER PCP: Bronson Curb, PA-C   Chief Complaint: Abnormal calcium.  HPI: Nicholas Becker is a 59 y.o. male  This is a 59 year old man who has a history of hepatitis C, cirrhosis of the liver and hepatocellular carcinoma. He is being followed for his cancer at [facility]. He is due to have liver MRI for further disease management. He went to the oncology department at [facility] today and blood work was done. He was found to have a calcium of 12.4 and was directed to come to the emergency room here today. He does not feel more unwell than usual. He denies any nausea, vomiting, abdominal pain. He says that his oral intake has been somewhat poor especially fluids. He is diabetic. He is now being admitted for further investigation and management of the hypercalcemia.   Review of Systems:  . Apart from symptoms above, all systems negative.  Past Medical History  Diagnosis Date  . Diabetes mellitus   . Hypertension     normal Bmet in 2012  . Hyperlipemia   . Chronic pancreatitis     calcified  . Tobacco abuse     1/2 pack per day  . Cholelithiasis 2009  . Bronchiectasis 2009    Left lower lobe; normal chest x-ray in 2012  . Cataracts, bilateral   . Overweight(278.02)   . 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 Nephrolithiasis 12/31/2010  . Substance abuse     Cocaine, marijuana, alcohol, stopped 10 yrs ago  . Pancreatitis   . GERD (gastroesophageal reflux disease)   . Chronic hepatitis C with cirrhosis 2006    Secondary to hepatitis C and ethanol abuse, which is now in remission; varices-status post banding at [facility]; normal LFTs in 2009; thrombocytopenia In the past, platelets-153,000 in 2012  . Cirrhosis   . Esophageal varices    Past Surgical History  Procedure Laterality Date  . Percutaneous liver biopsy  2006  . Multiple extractions  with alveoloplasty N/A 10/11/2014    Procedure: MULTIPLE EXTRACTIONS: Teeth # 1, 4, 6, 9, 10, 11, 12, 20, 28,  WITH ALVEOLOPLASTY AND REMOVAL OF BILATREAL MANDIBULAR TORI;  Surgeon: Diona Browner, DDS;  Location: [place];  Service: Oral Surgery;  Laterality: N/A;   Social History:  reports that he has been smoking Cigarettes.  He has a 15 pack-year smoking history. He has never used smokeless tobacco. He reports that he drinks alcohol. He reports that he does not use illicit drugs.  No Known Allergies  Family History  Problem Relation Age of Onset  . Healthy Sister   . Lung cancer Brother   . Healthy Sister   . Healthy Sister   . Healthy Sister   . Healthy Brother   . Healthy Brother   . Diabetes Daughter   . Healthy Son     Prior to Admission medications   Medication Sig Start Date End Date Taking? Authorizing Provider  amLODipine (NORVASC) 5 MG tablet Take 5 mg by mouth every morning.    Yes Historical Provider, MD  aspirin 81 MG chewable tablet Chew 81 mg by mouth every morning.   Yes Historical Provider, MD  Cholecalciferol (VITAMIN D) 2000 UNITS tablet Take 2,000 Units by mouth every morning.    Yes Historical Provider, MD  citalopram (CELEXA) 40 MG tablet Take 40 mg by mouth every morning.    Yes  Historical Provider, MD  insulin detemir (LEVEMIR) 100 UNIT/ML injection Inject 41 Units into the skin every morning.    Yes Historical Provider, MD  insulin lispro (HUMALOG) 100 UNIT/ML injection Inject 5 Units into the skin 3 (three) times daily before meals.   Yes Historical Provider, MD  lisinopril-hydrochlorothiazide (PRINZIDE,ZESTORETIC) 20-12.5 MG per tablet Take 2 tablets by mouth every morning.    Yes Historical Provider, MD  metFORMIN (GLUCOPHAGE) 1000 MG tablet Take 1,000 mg by mouth 2 (two) times daily with a meal.    Yes Historical Provider, MD  Multiple Vitamin (MULTIVITAMIN) tablet Take 1 tablet by mouth every morning.    Yes Historical Provider, MD  nadolol (CORGARD) 80 MG  tablet Take 80 mg by mouth every morning.    Yes Historical Provider, MD  Omega-3 Fatty Acids (FISH OIL) 1000 MG CAPS Take 1,000 mg by mouth every morning.    Yes Historical Provider, MD  omeprazole (PRILOSEC) 20 MG capsule Take 20 mg by mouth every morning.    Yes Historical Provider, MD  simvastatin (ZOCOR) 10 MG tablet Take 10 mg by mouth at bedtime.    Yes Historical Provider, MD  traZODone (DESYREL) 150 MG tablet Take 75 mg by mouth at bedtime.    Yes Historical Provider, MD   Physical Exam: Filed Vitals:   12/09/14 1707 12/09/14 1800 12/09/14 1900  BP: 128/60 152/77 135/77  Pulse: 66 62 60  Temp: 98.8 F (37.1 C)    TempSrc: Oral    Resp: 20 18 16   Height: 5\' 4"  (1.626 m)    Weight: 56.7 kg (125 lb)    SpO2: 99% 97% 97%    Wt Readings from Last 3 Encounters:  12/09/14 56.7 kg (125 lb)  11/12/14 57.199 kg (126 lb 1.6 oz)  10/11/14 58.968 kg (130 lb)    General:  Appears calm and comfortable. He looks systemically well despite his malignancy. He does not look clinically dehydrated. Eyes: PERRL, normal lids, irises & conjunctiva ENT: grossly normal hearing, lips & tongue Neck: no LAD, masses or thyromegaly Cardiovascular: RRR, no m/r/g. No LE edema. Telemetry: SR, no arrhythmias  Respiratory: CTA bilaterally, no w/r/r. Normal respiratory effort. Abdomen: soft, ntnd Skin: no rash or induration seen on limited exam Musculoskeletal: grossly normal tone BUE/BLE Psychiatric: grossly normal mood and affect, speech fluent and appropriate Neurologic: grossly non-focal.          Labs on Admission:  Basic Metabolic Panel:  Recent Labs Lab 12/09/14 1746  NA 130*  K 3.3*  CL 95*  CO2 25  GLUCOSE 331*  BUN 8  CREATININE 0.72  CALCIUM 11.6*  MG 0.9*   Liver Function Tests:  Recent Labs Lab 12/09/14 1746  AST 79*  ALT 29  ALKPHOS 222*  BILITOT 0.7  PROT 7.9  ALBUMIN 3.1*   No results for input(s): LIPASE, AMYLASE in the last 168 hours. No results for  input(s): AMMONIA in the last 168 hours. CBC:  Recent Labs Lab 12/09/14 1746  WBC 7.3  NEUTROABS 4.7  HGB 11.4*  HCT 32.8*  MCV 81.8  PLT 175   Cardiac Enzymes: No results for input(s): CKTOTAL, CKMB, CKMBINDEX, TROPONINI in the last 168 hours.  BNP (last 3 results) No results for input(s): BNP in the last 8760 hours.  ProBNP (last 3 results) No results for input(s): PROBNP in the last 8760 hours.  CBG: No results for input(s): GLUCAP in the last 168 hours.  Radiological Exams on Admission: No results found.   Assessment/Plan  1. Hypercalcemia. This is possibly related to malignancy. He'll be treated with aggressive IV fluids and monitor calcium closely. I will obtain ionized calcium together with parathyroid hormone levels. Hold multivitamins and vitamin D for the time being. Oncology consultation. Gastroenterology consultation. 2. Diabetes. Continue with home medications and sliding scale of insulin. 3. Hypertension. Stable. 4. Hypomagnesemia. Replacement therapy.  Further recommendations will depend on patient's hospital progress.  Code Status: Full code.  DVT Prophylaxis: Lovenox.  Family Communication: I discussed the plan with the patient at the bedside.   Disposition Plan: Home when medically stable.  Time spent: 60 minutes.  Doree Albee [facility] Pager [phone].

## 2014-12-09 NOTE — ED Notes (Signed)
Patient states that he had blood drawn this morning. Calcium 12.4. Patient told to come to the ED by his liver doctor.

## 2014-12-09 NOTE — ED Provider Notes (Signed)
CSN: [id number]     Arrival date & time 12/09/14  1647 History   First MD Initiated Contact with Patient 12/09/14 1740     Chief Complaint  Patient presents with  . Abnormal Lab      HPI Patient states that he had blood drawn this morning. Calcium 12.4. Patient told to come to the ED by his liver doctor. Past Medical History  Diagnosis Date  . Diabetes mellitus   . Hypertension     normal Bmet in 2012  . Hyperlipemia   . Chronic pancreatitis     calcified  . Tobacco abuse     1/2 pack per day  . Cholelithiasis 2009  . Bronchiectasis 2009    Left lower lobe; normal chest x-ray in 2012  . Cataracts, bilateral   . Overweight(278.02)   . 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 Nephrolithiasis 12/31/2010  . Substance abuse     Cocaine, marijuana, alcohol, stopped 10 yrs ago  . Pancreatitis   . GERD (gastroesophageal reflux disease)   . Chronic hepatitis C with cirrhosis 2006    Secondary to hepatitis C and ethanol abuse, which is now in remission; varices-status post banding at [facility]; normal LFTs in 2009; thrombocytopenia In the past, platelets-153,000 in 2012  . Cirrhosis   . Esophageal varices    Past Surgical History  Procedure Laterality Date  . Percutaneous liver biopsy  2006  . Multiple extractions with alveoloplasty N/A 10/11/2014    Procedure: MULTIPLE EXTRACTIONS: Teeth # 1, 4, 6, 9, 10, 11, 12, 20, 28,  WITH ALVEOLOPLASTY AND REMOVAL OF BILATREAL MANDIBULAR TORI;  Surgeon: Diona Browner, DDS;  [facility];  Service: Oral Surgery;  Laterality: N/A;   Family History  Problem Relation Age of Onset  . Healthy Sister   . Lung cancer Brother   . Healthy Sister   . Healthy Sister   . Healthy Sister   . Healthy Brother   . Healthy Brother   . Diabetes Daughter   . Healthy Son    Social History  Substance Use Topics  . Smoking status: Current Every Day Smoker -- 0.50 packs/day for 30 years    Types: Cigarettes  . Smokeless tobacco: Never Used     Comment:  Patient smokes 1 pack a day  . Alcohol Use: 0.0 oz/week    0 Standard drinks or equivalent per week    Review of Systems   All other systems reviewed and are negative Allergies  Review of patient's allergies indicates no known allergies.  Home Medications   Prior to Admission medications   Medication Sig Start Date End Date Taking? Authorizing Provider  amLODipine (NORVASC) 5 MG tablet Take 5 mg by mouth every morning.    Yes Historical Provider, MD  aspirin 81 MG chewable tablet Chew 81 mg by mouth every morning.   Yes Historical Provider, MD  Cholecalciferol (VITAMIN D) 2000 UNITS tablet Take 2,000 Units by mouth every morning.    Yes Historical Provider, MD  citalopram (CELEXA) 40 MG tablet Take 40 mg by mouth every morning.    Yes Historical Provider, MD  insulin detemir (LEVEMIR) 100 UNIT/ML injection Inject 41 Units into the skin every morning.    Yes Historical Provider, MD  insulin lispro (HUMALOG) 100 UNIT/ML injection Inject 5 Units into the skin 3 (three) times daily before meals.   Yes Historical Provider, MD  metFORMIN (GLUCOPHAGE) 1000 MG tablet Take 1,000 mg by mouth 2 (two) times daily  with a meal.    Yes Historical Provider, MD  Multiple Vitamin (MULTIVITAMIN) tablet Take 1 tablet by mouth every morning.    Yes Historical Provider, MD  nadolol (CORGARD) 80 MG tablet Take 80 mg by mouth every morning.    Yes Historical Provider, MD  Omega-3 Fatty Acids (FISH OIL) 1000 MG CAPS Take 1,000 mg by mouth every morning.    Yes Historical Provider, MD  omeprazole (PRILOSEC) 20 MG capsule Take 20 mg by mouth every morning.    Yes Historical Provider, MD  simvastatin (ZOCOR) 10 MG tablet Take 10 mg by mouth at bedtime.    Yes Historical Provider, MD  traZODone (DESYREL) 150 MG tablet Take 75 mg by mouth at bedtime.    Yes Historical Provider, MD  lisinopril (PRINIVIL,ZESTRIL) 40 MG tablet Take 1 tablet (40 mg total) by mouth daily. 12/10/14   Kathie Dike, MD   BP 122/67 mmHg   Pulse 59  Temp(Src) 98.7 F (37.1 C) (Oral)  Resp 18  Ht 5\' 4"  (1.626 m)  Wt 122 lb 2.2 oz (55.4 kg)  BMI 20.95 kg/m2  SpO2 100% Physical Exam Physical Exam  Nursing note and vitals reviewed. Constitutional: He is oriented to person, place, and time. He appears well-developed and well-nourished. No distress.  HENT:  Head: Normocephalic and atraumatic.  Eyes: Pupils are equal, round, and reactive to light.  Neck: Normal range of motion.  Cardiovascular: Normal rate and intact distal pulses.   Pulmonary/Chest: No respiratory distress.  Abdominal: Normal appearance. He exhibits no distension.  Musculoskeletal: Normal range of motion.  Neurological: He is alert and oriented to person, place, and time. No cranial nerve deficit.  Skin: Skin is warm and dry. No rash noted.  Psychiatric: He has a normal mood and affect. His behavior is normal.   ED Course  Procedures (including critical care time) Labs Review Labs Reviewed  COMPREHENSIVE METABOLIC PANEL - Abnormal; Notable for the following:    Sodium 130 (*)    Potassium 3.3 (*)    Chloride 95 (*)    Glucose, Bld 331 (*)    Calcium 11.6 (*)    Albumin 3.1 (*)    AST 79 (*)    Alkaline Phosphatase 222 (*)    All other components within normal limits  CBC WITH DIFFERENTIAL/PLATELET - Abnormal; Notable for the following:    RBC 4.01 (*)    Hemoglobin 11.4 (*)    HCT 32.8 (*)    RDW 16.9 (*)    All other components within normal limits  PTH, INTACT AND CALCIUM - Abnormal; Notable for the following:    PTH 5 (*)    All other components within normal limits  MAGNESIUM - Abnormal; Notable for the following:    Magnesium 0.9 (*)    All other components within normal limits  COMPREHENSIVE METABOLIC PANEL - Abnormal; Notable for the following:    Potassium 2.9 (*)    Glucose, Bld 47 (*)    BUN 5 (*)    Creatinine, Ser 0.45 (*)    Total Protein 6.3 (*)    Albumin 2.5 (*)    AST 64 (*)    Alkaline Phosphatase 170 (*)    All  other components within normal limits  CBC - Abnormal; Notable for the following:    RBC 3.53 (*)    Hemoglobin 9.9 (*)    HCT 28.8 (*)    RDW 16.8 (*)    Platelets 147 (*)    All other components  within normal limits  CALCIUM, IONIZED - Abnormal; Notable for the following:    Calcium, Ionized, Serum 6.5 (*)    All other components within normal limits  MAGNESIUM - Abnormal; Notable for the following:    Magnesium 1.2 (*)    All other components within normal limits  GLUCOSE, CAPILLARY - Abnormal; Notable for the following:    Glucose-Capillary 57 (*)    All other components within normal limits  GLUCOSE, CAPILLARY - Abnormal; Notable for the following:    Glucose-Capillary 148 (*)    All other components within normal limits  GLUCOSE, CAPILLARY - Abnormal; Notable for the following:    Glucose-Capillary 190 (*)    All other components within normal limits  BASIC METABOLIC PANEL - Abnormal; Notable for the following:    Sodium 132 (*)    Chloride 100 (*)    Glucose, Bld 249 (*)    Creatinine, Ser 0.58 (*)    All other components within normal limits  GLUCOSE, CAPILLARY - Abnormal; Notable for the following:    Glucose-Capillary 266 (*)    All other components within normal limits  GLUCOSE, CAPILLARY  MAGNESIUM    Imaging Review No results found. I, [place], personally reviewed and evaluated these images and lab results as part of my medical decision-making.   EKG Interpretation   Date/Time:  Monday December 09 2014 17:09:57 EDT Ventricular Rate:  62 PR Interval:  184 QRS Duration: 72 QT Interval:  442 QTC Calculation: 448 R Axis:   4 Text Interpretation:  Normal sinus rhythm Nonspecific ST abnormality  Abnormal ECG Confirmed by Kierre Hintz  MD, Meili Kleckley ([id number]) on 12/09/2014 5:40:16  PM     Patient was admitted to the hospitalist MDM   Final diagnoses:  Hepatocellular carcinoma  Hypercalcemia  Hypomagnesemia        Leonard Schwartz, MD 12/21/14 [phone]

## 2014-12-09 NOTE — ED Notes (Addendum)
CRITICAL VALUE ALERT  Critical value received:  Magnesium (0.9)  Date of notification:  12/09/2014  Time of notification:  1939  Critical value read back: yes  Nurse who received alert:  Roma Kayser, RN  MD notified (1st page):  [phone]  Called to nurse by: Finis Bud

## 2014-12-10 DIAGNOSIS — D638 Anemia in other chronic diseases classified elsewhere: Secondary | ICD-10-CM | POA: Diagnosis present

## 2014-12-10 DIAGNOSIS — E876 Hypokalemia: Secondary | ICD-10-CM | POA: Diagnosis present

## 2014-12-10 DIAGNOSIS — I10 Essential (primary) hypertension: Secondary | ICD-10-CM

## 2014-12-10 DIAGNOSIS — C220 Liver cell carcinoma: Secondary | ICD-10-CM

## 2014-12-10 DIAGNOSIS — B182 Chronic viral hepatitis C: Secondary | ICD-10-CM

## 2014-12-10 DIAGNOSIS — E860 Dehydration: Secondary | ICD-10-CM | POA: Diagnosis present

## 2014-12-10 DIAGNOSIS — E871 Hypo-osmolality and hyponatremia: Secondary | ICD-10-CM | POA: Diagnosis present

## 2014-12-10 LAB — BASIC METABOLIC PANEL
ANION GAP: 7 (ref 5–15)
BUN: 7 mg/dL (ref 6–20)
CALCIUM: 9.5 mg/dL (ref 8.9–10.3)
CO2: 25 mmol/L (ref 22–32)
CREATININE: 0.58 mg/dL — AB (ref 0.61–1.24)
Chloride: 100 mmol/L — ABNORMAL LOW (ref 101–111)
GLUCOSE: 249 mg/dL — AB (ref 65–99)
Potassium: 4 mmol/L (ref 3.5–5.1)
Sodium: 132 mmol/L — ABNORMAL LOW (ref 135–145)

## 2014-12-10 LAB — COMPREHENSIVE METABOLIC PANEL
ALBUMIN: 2.5 g/dL — AB (ref 3.5–5.0)
ALT: 27 U/L (ref 17–63)
ANION GAP: 5 (ref 5–15)
AST: 64 U/L — ABNORMAL HIGH (ref 15–41)
Alkaline Phosphatase: 170 U/L — ABNORMAL HIGH (ref 38–126)
BILIRUBIN TOTAL: 0.9 mg/dL (ref 0.3–1.2)
BUN: 5 mg/dL — ABNORMAL LOW (ref 6–20)
CO2: 27 mmol/L (ref 22–32)
Calcium: 10.2 mg/dL (ref 8.9–10.3)
Chloride: 103 mmol/L (ref 101–111)
Creatinine, Ser: 0.45 mg/dL — ABNORMAL LOW (ref 0.61–1.24)
GFR calc Af Amer: >60 mL/min (ref >60)
GFR calc non Af Amer: >60 mL/min (ref >60)
GLUCOSE: 47 mg/dL — AB (ref 65–99)
POTASSIUM: 2.9 mmol/L — AB (ref 3.5–5.1)
SODIUM: 135 mmol/L (ref 135–145)
TOTAL PROTEIN: 6.3 g/dL — AB (ref 6.5–8.1)

## 2014-12-10 LAB — CBC
HEMATOCRIT: 28.8 % — AB (ref 39.0–52.0)
HEMOGLOBIN: 9.9 g/dL — AB (ref 13.0–17.0)
MCH: 28 pg (ref 26.0–34.0)
MCHC: 34.4 g/dL (ref 30.0–36.0)
MCV: 81.6 fL (ref 78.0–100.0)
Platelets: 147 10*3/uL — ABNORMAL LOW (ref 150–400)
RBC: 3.53 MIL/uL — ABNORMAL LOW (ref 4.22–5.81)
RDW: 16.8 % — ABNORMAL HIGH (ref 11.5–15.5)
WBC: 5.4 10*3/uL (ref 4.0–10.5)

## 2014-12-10 LAB — GLUCOSE, CAPILLARY
GLUCOSE-CAPILLARY: 57 mg/dL — AB (ref 65–99)
GLUCOSE-CAPILLARY: 190 mg/dL — AB (ref 65–99)
Glucose-Capillary: 148 mg/dL — ABNORMAL HIGH (ref 65–99)
Glucose-Capillary: 88 mg/dL (ref 65–99)
Glucose-Capillary: 266 mg/dL — ABNORMAL HIGH (ref 65–99)

## 2014-12-10 LAB — MAGNESIUM
MAGNESIUM: 1.2 mg/dL — AB (ref 1.7–2.4)
MAGNESIUM: 2.3 mg/dL (ref 1.7–2.4)

## 2014-12-10 MED ORDER — GLUCOSE 40 % PO GEL
ORAL | Status: AC
  Administered 2014-12-10: 37.5 g
  Filled 2014-12-10: qty 1

## 2014-12-10 MED ORDER — ENSURE ENLIVE PO LIQD
237.0000 mL | Freq: Two times a day (BID) | ORAL | Status: DC
  Administered 2014-12-10: 237 mL via ORAL

## 2014-12-10 MED ORDER — GLUCERNA SHAKE PO LIQD
237.0000 mL | Freq: Two times a day (BID) | ORAL | Status: DC
  Administered 2014-12-10: 237 mL via ORAL

## 2014-12-10 MED ORDER — LISINOPRIL 40 MG PO TABS: 40.0000 mg | ORAL_TABLET | Freq: Every day | ORAL | Status: AC

## 2014-12-10 MED ORDER — INSULIN DETEMIR 100 UNIT/ML ~~LOC~~ SOLN
35.0000 [IU] | Freq: Every morning | SUBCUTANEOUS | Status: DC
  Administered 2014-12-10: 35 [IU] via SUBCUTANEOUS
  Filled 2014-12-10 (×2): qty 0.35

## 2014-12-10 MED ORDER — POTASSIUM CHLORIDE CRYS ER 20 MEQ PO TBCR
40.0000 meq | EXTENDED_RELEASE_TABLET | ORAL | Status: AC
  Administered 2014-12-10 (×3): 40 meq via ORAL
  Filled 2014-12-10 (×3): qty 2

## 2014-12-10 MED ORDER — MAGNESIUM SULFATE 4 GM/100ML IV SOLN
4.0000 g | Freq: Once | INTRAVENOUS | Status: AC
  Administered 2014-12-10: 4 g via INTRAVENOUS
  Filled 2014-12-10: qty 100

## 2014-12-10 NOTE — Clinical Social Work Note (Signed)
Clinical Social Work Assessment  Patient Details  Name: Nicholas Becker MRN: [id number] Date of Birth: January 07, 1956  Date of referral:  12/10/14               Reason for consult:  Facility Placement                Permission sought to share information with:    Permission granted to share information::     Name::        Agency::     Relationship::     Contact Information:     Housing/Transportation Living arrangements for the past 2 months:  [place] of Information:  Patient Patient Interpreter Needed:  None Criminal Activity/Legal Involvement Pertinent to Current Situation/Hospitalization:  No - Comment as needed Significant Relationships:  Other Family Members Lives with:  Facility Resident Do you feel safe going back to the place where you live?  Yes Need for family participation in patient care:  Yes (Comment)  Care giving concerns:  Facility resident.   Social Worker assessment / plan:  Patient advised that he has been a resident at [facility] for about ten years.  He stated that he ambulates unassisted, completes ADLs independently and plans on returning to the facility upon discharge.  He advised that his family visits with him at the facility.   CSW spoke with Verlene Mayer, owner of the ALF.  Ms. Philipp Ovens confirmed patient's statements.  She advised that patient can return to the facility upon discharge.   Employment status:  Disabled (Comment on whether or not currently receiving Disability) Insurance information:  Medicare PT Recommendations:  Not assessed at this time Information / Referral to community resources:     Patient/Family's Response to care:  Patient is agreeable to return to SNF upon discharge.  Patient/Family's Understanding of and Emotional Response to Diagnosis, Current Treatment, and Prognosis:  Patient verbalized understanding of his diagnosis, current treatment and prognosis.  Emotional  Assessment Appearance:  Developmentally appropriate Attitude/Demeanor/Rapport:   (Cooperative) Affect (typically observed):  Calm Orientation:  Oriented to Self, Oriented to Place, Oriented to  Time, Oriented to Situation Alcohol / Substance use:  Not Applicable Psych involvement (Current and /or in the community):  No (Comment)  Discharge Needs  Concerns to be addressed:  Discharge Planning Concerns Readmission within the last 30 days:  No Current discharge risk:  None Barriers to Discharge:  No Barriers Identified   Ihor Gully, LCSW 12/10/2014, 2:18 PM [phone]

## 2014-12-10 NOTE — Care Management Note (Signed)
Case Management Note  Patient Details  Name: Nicholas Becker MRN: [id number] Date of Birth: 10-25-1955  Expected Discharge Date:                  Expected Discharge Plan:  Assisted Living / Rest Home  In-House Referral:  Clinical Social Work  Discharge planning Services  CM Consult  Post Acute Care Choice:  NA Choice offered to:  NA  DME Arranged:    DME Agency:     HH Arranged:    [place] Agency:     Status of Service:  Completed, signed off  Medicare Important Message Given:    Date Medicare IM Given:    Medicare IM give by:    Date Additional Medicare IM Given:    Additional Medicare Important Message give by:     If discussed at [place] of Stay Meetings, dates discussed:    Additional Comments: Pt admitted with hypercalcemia. Pt is from [facility]. Pt is independent with ADL's. PT plans to return to ALF at DC. CSW is aware of pt's DC plan and will arrange for return to facility at DC. No CM needs at this time.   Sherald Barge, RN 12/10/2014, 11:27 AM

## 2014-12-10 NOTE — Progress Notes (Signed)
Patient discharged with instructions given on medications,and follow up visits,patient verbalized understanding. Prescription sent with patient. No c/o pain or discomfort noted. Vital signs stable.

## 2014-12-10 NOTE — Progress Notes (Addendum)
CRITICAL VALUE ALERT  Critical value received:  CBG 57  Date of notification:  12/09/2013  Time of notification:  0748  Critical value read back:Yes.    Nurse who received alert:  Radene Gunning  MD notified (1st page):  Dr Roderic Palau  Time of first page:  [phone]  MD notified (2nd page):  Time of second page:  Responding MD:  Dr Roderic Palau  Time MD responded:  [phone]

## 2014-12-10 NOTE — Progress Notes (Deleted)
Physician Discharge Summary  Nicholas Becker TIR:[id number] DOB: Apr 18, 1956 DOA: 12/09/2014  PCP: Bronson Curb, PA-C  Admit date: 12/09/2014 Discharge date: 12/10/2014  Time spent: 40 minutes  Recommendations for Outpatient Follow-up:  1. Follow-up with primary care physician in 1-2 weeks for repeat basic metabolic panel 2. Follow-up with [facility] for further cancer care  Discharge Diagnoses:  Active Problems:   Diabetes mellitus   Tobacco abuse   Hypertension   Chronic hepatitis C with cirrhosis   HCC (hepatocellular carcinoma)   Hypercalcemia   Dehydration   Hypokalemia   Hypomagnesemia   Hyponatremia   Anemia of chronic disease   Discharge Condition: improved  Diet recommendation: low salt, low carb  Filed Weights   12/09/14 1707 12/09/14 2222  Weight: 56.7 kg (125 lb) 55.4 kg (122 lb 2.2 oz)    History of present illness and hospital course:  59 year old male with a history of hepatocellular carcinoma and hep C cirrhosis followed at [facility] recently had labs drawn at [facility]. He was called at home this morning bilaterally and was told to present to the emergency room for evaluation for elevated calcium at 12.4. He reportedly had poor oral intake. Review of medications are indicated that he was on a thiazide diuretics. He was also noted to have other electrolyte abnormalities including hypokalemia, hypomagnesemia and hyponatremia. The patient was started on intravenous hydration and electrolyte replacement. His lab values quickly corrected to normal range. The patient is feeling significantly improved. He was advised to continue with oral hydration. His thiazide diuretics has been discontinued. Recommend following up with primary care physician once 2 weeks to repeat basic metabolic panel. He will continue his cancer care at [facility] He is otherwise stable for discharge.   Procedures:    Consultations:    Discharge Exam: Filed  Vitals:   12/10/14 1526  BP: 122/67  Pulse: 59  Temp: 98.7 F (37.1 C)  Resp: 18    General: NAD Cardiovascular: S1, S2 RRR Respiratory: CTA B  Discharge Instructions   Discharge Instructions    Diet - low sodium heart healthy    Complete by:  As directed      Increase activity slowly    Complete by:  As directed           Current Discharge Medication List    START taking these medications   Details  lisinopril (PRINIVIL,ZESTRIL) 40 MG tablet Take 1 tablet (40 mg total) by mouth daily. Qty: 30 tablet, Refills: 1      CONTINUE these medications which have NOT CHANGED   Details  amLODipine (NORVASC) 5 MG tablet Take 5 mg by mouth every morning.     aspirin 81 MG chewable tablet Chew 81 mg by mouth every morning.    Cholecalciferol (VITAMIN D) 2000 UNITS tablet Take 2,000 Units by mouth every morning.     citalopram (CELEXA) 40 MG tablet Take 40 mg by mouth every morning.     insulin detemir (LEVEMIR) 100 UNIT/ML injection Inject 41 Units into the skin every morning.     insulin lispro (HUMALOG) 100 UNIT/ML injection Inject 5 Units into the skin 3 (three) times daily before meals.    metFORMIN (GLUCOPHAGE) 1000 MG tablet Take 1,000 mg by mouth 2 (two) times daily with a meal.     Multiple Vitamin (MULTIVITAMIN) tablet Take 1 tablet by mouth every morning.     nadolol (CORGARD) 80 MG tablet Take 80 mg by mouth every morning.  Omega-3 Fatty Acids (FISH OIL) 1000 MG CAPS Take 1,000 mg by mouth every morning.     omeprazole (PRILOSEC) 20 MG capsule Take 20 mg by mouth every morning.     simvastatin (ZOCOR) 10 MG tablet Take 10 mg by mouth at bedtime.     traZODone (DESYREL) 150 MG tablet Take 75 mg by mouth at bedtime.       STOP taking these medications     lisinopril-hydrochlorothiazide (PRINZIDE,ZESTORETIC) 20-12.5 MG per tablet        No Known Allergies Follow-up Information    Follow up with ROBERTSON, ANTHONY T, PA-C. Schedule an appointment as  soon as possible for a visit in 2 weeks.   Specialty:  Physician Assistant   Contact information:   [address] [place] [address] [phone]        The results of significant diagnostics from this hospitalization (including imaging, microbiology, ancillary and laboratory) are listed below for reference.    Significant Diagnostic Studies: No results found.  Microbiology: No results found for this or any previous visit (from the past 240 hour(s)).   Labs: Basic Metabolic Panel:  Recent Labs Lab 12/09/14 1746 12/10/14 0425 12/10/14 1520  NA 130* 135 132*  K 3.3* 2.9* 4.0  CL 95* 103 100*  CO2 25 27 25   GLUCOSE 331* 47* 249*  BUN 8 5* 7  CREATININE 0.72 0.45* 0.58*  CALCIUM 11.6* 10.2 9.5  MG 0.9* 1.2* 2.3   Liver Function Tests:  Recent Labs Lab 12/09/14 1746 12/10/14 0425  AST 79* 64*  ALT 29 27  ALKPHOS 222* 170*  BILITOT 0.7 0.9  PROT 7.9 6.3*  ALBUMIN 3.1* 2.5*   No results for input(s): LIPASE, AMYLASE in the last 168 hours. No results for input(s): AMMONIA in the last 168 hours. CBC:  Recent Labs Lab 12/09/14 1746 12/10/14 0425  WBC 7.3 5.4  NEUTROABS 4.7  --   HGB 11.4* 9.9*  HCT 32.8* 28.8*  MCV 81.8 81.6  PLT 175 147*   Cardiac Enzymes: No results for input(s): CKTOTAL, CKMB, CKMBINDEX, TROPONINI in the last 168 hours. BNP: BNP (last 3 results) No results for input(s): BNP in the last 8760 hours.  ProBNP (last 3 results) No results for input(s): PROBNP in the last 8760 hours.  CBG:  Recent Labs Lab 12/09/14 2227 12/10/14 0737 12/10/14 0823 12/10/14 1128 12/10/14 1613  GLUCAP 148* 57* 88 190* 266*       Signed:  MEMON,JEHANZEB  [facility] 12/10/2014, 4:47 PM

## 2014-12-10 NOTE — Discharge Summary (Signed)
Physician Discharge Summary  Nicholas Becker OBS:[id number] DOB: 11/30/1955 DOA: 12/09/2014  PCP: Bronson Curb, PA-C  Admit date: 12/09/2014 Discharge date: 12/10/2014  Time spent: 40 minutes  Recommendations for Outpatient Follow-up:  1. Follow-up with primary care physician in 1-2 weeks for repeat basic metabolic panel 2. Follow-up with [facility] for further cancer care  Discharge Diagnoses:  Active Problems:   Diabetes mellitus   Tobacco abuse   Hypertension   Chronic hepatitis C with cirrhosis   HCC (hepatocellular carcinoma)   Hypercalcemia   Dehydration   Hypokalemia   Hypomagnesemia   Hyponatremia   Anemia of chronic disease   Discharge Condition: improved  Diet recommendation: low salt, low carb  Filed Weights   12/09/14 1707 12/09/14 2222  Weight: 56.7 kg (125 lb) 55.4 kg (122 lb 2.2 oz)    History of present illness and hospital course:  59 year old male with a history of hepatocellular carcinoma and hep C cirrhosis followed at [facility] recently had labs drawn at [facility]. He was called at home this morning bilaterally and was told to present to the emergency room for evaluation for elevated calcium at 12.4. He reportedly had poor oral intake. Review of medications are indicated that he was on a thiazide diuretics. He was also noted to have other electrolyte abnormalities including hypokalemia, hypomagnesemia and hyponatremia. The patient was started on intravenous hydration and electrolyte replacement. His lab values quickly corrected to normal range. The patient is feeling significantly improved. He was advised to continue with oral hydration. His thiazide diuretics has been discontinued. Recommend following up with primary care physician once 2 weeks to repeat basic metabolic panel. He will continue his cancer care at [facility]. He is otherwise stable for discharge.   Procedures:    Consultations:    Discharge Exam: Filed  Vitals:   12/10/14 1526  BP: 122/67  Pulse: 59  Temp: 98.7 F (37.1 C)  Resp: 18    General: NAD Cardiovascular: S1, S2 RRR Respiratory: CTA B  Discharge Instructions   Discharge Instructions    Diet - low sodium heart healthy    Complete by:  As directed      Increase activity slowly    Complete by:  As directed           Current Discharge Medication List    START taking these medications   Details  lisinopril (PRINIVIL,ZESTRIL) 40 MG tablet Take 1 tablet (40 mg total) by mouth daily. Qty: 30 tablet, Refills: 1      CONTINUE these medications which have NOT CHANGED   Details  amLODipine (NORVASC) 5 MG tablet Take 5 mg by mouth every morning.     aspirin 81 MG chewable tablet Chew 81 mg by mouth every morning.    Cholecalciferol (VITAMIN D) 2000 UNITS tablet Take 2,000 Units by mouth every morning.     citalopram (CELEXA) 40 MG tablet Take 40 mg by mouth every morning.     insulin detemir (LEVEMIR) 100 UNIT/ML injection Inject 41 Units into the skin every morning.     insulin lispro (HUMALOG) 100 UNIT/ML injection Inject 5 Units into the skin 3 (three) times daily before meals.    metFORMIN (GLUCOPHAGE) 1000 MG tablet Take 1,000 mg by mouth 2 (two) times daily with a meal.     Multiple Vitamin (MULTIVITAMIN) tablet Take 1 tablet by mouth every morning.     nadolol (CORGARD) 80 MG tablet Take 80 mg by mouth every morning.  Omega-3 Fatty Acids (FISH OIL) 1000 MG CAPS Take 1,000 mg by mouth every morning.     omeprazole (PRILOSEC) 20 MG capsule Take 20 mg by mouth every morning.     simvastatin (ZOCOR) 10 MG tablet Take 10 mg by mouth at bedtime.     traZODone (DESYREL) 150 MG tablet Take 75 mg by mouth at bedtime.       STOP taking these medications     lisinopril-hydrochlorothiazide (PRINZIDE,ZESTORETIC) 20-12.5 MG per tablet        No Known Allergies Follow-up Information    Follow up with ROBERTSON, ANTHONY T, PA-C. Schedule an appointment as  soon as possible for a visit in 2 weeks.   Specialty:  Physician Assistant   Contact information:   [address] [place]  [address] [phone]        The results of significant diagnostics from this hospitalization (including imaging, microbiology, ancillary and laboratory) are listed below for reference.    Significant Diagnostic Studies: No results found.  Microbiology: No results found for this or any previous visit (from the past 240 hour(s)).   Labs: Basic Metabolic Panel:  Recent Labs Lab 12/09/14 1746 12/10/14 0425 12/10/14 1520  NA 130* 135 132*  K 3.3* 2.9* 4.0  CL 95* 103 100*  CO2 25 27 25   GLUCOSE 331* 47* 249*  BUN 8 5* 7  CREATININE 0.72 0.45* 0.58*  CALCIUM 11.6* 10.2 9.5  MG 0.9* 1.2* 2.3   Liver Function Tests:  Recent Labs Lab 12/09/14 1746 12/10/14 0425  AST 79* 64*  ALT 29 27  ALKPHOS 222* 170*  BILITOT 0.7 0.9  PROT 7.9 6.3*  ALBUMIN 3.1* 2.5*   No results for input(s): LIPASE, AMYLASE in the last 168 hours. No results for input(s): AMMONIA in the last 168 hours. CBC:  Recent Labs Lab 12/09/14 1746 12/10/14 0425  WBC 7.3 5.4  NEUTROABS 4.7  --   HGB 11.4* 9.9*  HCT 32.8* 28.8*  MCV 81.8 81.6  PLT 175 147*   Cardiac Enzymes: No results for input(s): CKTOTAL, CKMB, CKMBINDEX, TROPONINI in the last 168 hours. BNP: BNP (last 3 results) No results for input(s): BNP in the last 8760 hours.  ProBNP (last 3 results) No results for input(s): PROBNP in the last 8760 hours.  CBG:  Recent Labs Lab 12/09/14 2227 12/10/14 0737 12/10/14 0823 12/10/14 1128 12/10/14 1613  GLUCAP 148* 57* 88 190* 266*       Signed:  Aryan Sparks  [facility] 12/10/2014, 4:50 PM

## 2014-12-10 NOTE — Progress Notes (Deleted)
[facility] PROGRESS NOTE  JULIO ZAPPIA JJO:[id number] DOB: 03-Jun-1955 DOA: 12/09/2014 PCP: Bronson Curb, PA-C  Summary  66 yom with history of hepatocellular carcinoma.and hep C cirrhosis being followed at [facility], admitted to the hospital for hypercalcemia, possibly related to dehydration and hypomagnesemia. Both are rapidly resolving with aggressive IVF and replacement therapy. Anticipate discharge once calcium and magnesium has normalized.    Assessment/Plan: 1. Hypercalcemia  resolved with aggressive IVF and monitoring of calcium. Multivitmains and Vitamin D was held during treatment. Parathyroid hormone has been ordered and is pending. This can likely be followed up in the outpatient setting/ . 2. Hypomagnesemia/hypercalemia improving. Will continue replacement therapy . 3. DM Type 2. Pt is having episodes of hypoglycemia this morning. Will decrease levemir to 35 units daily, hold Metformin and continue on SSI 4. HTN. Stable Continue  Lisinopril and Amlodipine. 5. [place]. Pt plans to cont follow up at [facility]. Has upcoming MRI of the liver.   Code Status: Full DVT prophylaxis: Lovenox Family Communication: Discussed with patient who understands and has no concerns at this time. Disposition Plan: Anticipate discharge home in 24 hours   Consultants:    Procedures:    Antibiotics:     HPI/Subjective:  Pt reports getting blood work done at [facility] and was called yesterday morning and was told that he should go the ED due to hypercalcemia. He is currently receiving chemotherapy hepatocellular carcinoma.He is feeling fine today and able to eat without difficulty. No reports of chest pain, shortness of breath, n/v/d, or abd pain.  He is unsure of when he will return to [facility] for his next clinic appointment. .  Objective: Filed Vitals:   12/10/14 0527  BP: 128/74  Pulse: 55  Temp: 98.8 F (37.1 C)  Resp: 20   No intake or output data in the 24 hours  ending 12/10/14 0656 Filed Weights   12/09/14 1707 12/09/14 2222  Weight: 56.7 kg (125 lb) 55.4 kg (122 lb 2.2 oz)    Exam: General: NAD, looks comfortable Cardiovascular: RRR, S1, S2  Respiratory: clear bilaterally, No wheezing, rales or rhnochi Abdomen: soft, non tender, no distention , bowel sounds normal Musculoskeletal: No edema b/l   Data Reviewed: Basic Metabolic Panel:  Recent Labs Lab 12/09/14 1746 12/10/14 0425  NA 130* 135  K 3.3* 2.9*  CL 95* 103  CO2 25 27  GLUCOSE 331* 47*  BUN 8 5*  CREATININE 0.72 0.45*  CALCIUM 11.6* 10.2  MG 0.9* 1.2*   Liver Function Tests:  Recent Labs Lab 12/09/14 1746 12/10/14 0425  AST 79* 64*  ALT 29 27  ALKPHOS 222* 170*  BILITOT 0.7 0.9  PROT 7.9 6.3*  ALBUMIN 3.1* 2.5*   No results for input(s): LIPASE, AMYLASE in the last 168 hours. No results for input(s): AMMONIA in the last 168 hours. CBC:  Recent Labs Lab 12/09/14 1746 12/10/14 0425  WBC 7.3 5.4  NEUTROABS 4.7  --   HGB 11.4* 9.9*  HCT 32.8* 28.8*  MCV 81.8 81.6  PLT 175 147*     Studies: No results found.  Scheduled Meds: . amLODipine  5 mg Oral q morning - 10a  . aspirin  81 mg Oral q morning - 10a  . citalopram  40 mg Oral q morning - 10a  . enoxaparin (LOVENOX) injection  40 mg Subcutaneous Q24H  . feeding supplement (ENSURE ENLIVE)  237 mL Oral BID BM  . lisinopril  20 mg Oral Daily   And  .  hydrochlorothiazide  12.5 mg Oral Daily  . insulin aspart  0-15 Units Subcutaneous TID WC  . insulin aspart  0-5 Units Subcutaneous QHS  . insulin detemir  41 Units Subcutaneous q morning - 10a  . metFORMIN  1,000 mg Oral BID WC  . nadolol  80 mg Oral q morning - 10a  . omega-3 acid ethyl esters  1 g Oral Daily  . pantoprazole  40 mg Oral Daily  . simvastatin  10 mg Oral QHS  . sodium chloride  3 mL Intravenous Q12H  . traZODone  75 mg Oral QHS   Continuous Infusions: . sodium chloride 125 mL/hr at 12/10/14 0029    Active Problems:    Diabetes mellitus   Tobacco abuse   Hypertension   Chronic hepatitis C with cirrhosis   HCC (hepatocellular carcinoma)   Hypercalcemia of malignancy    Time spent: 25 minutes     Kathie Dike, MD [facility] Pager [phone]. If 7PM-7AM, please contact night-coverage at [email or web address], password [facility] 12/10/2014, 6:56 AM  LOS: 1 day    I, Jessica D. Leonie Green, acting as scribe, recorded this note contemporaneously in the presence of Dr. Kathie Dike, M.D. on 12/10/2014.  I have reviewed the above documentation for accuracy and completeness, and I agree with the above.  Seda Kronberg

## 2014-12-10 NOTE — Progress Notes (Signed)
Initial Nutrition Assessment  DOCUMENTATION CODES:  Not applicable  INTERVENTION:  Glucerna Shake po BID, each supplement provides 220 kcal and 10 grams of protein  Gave handout titled "Dehydration" and education on staying hydrated  NUTRITION DIAGNOSIS:  Increased nutrient needs related to cancer and cancer related treatments as evidenced by estimated requirements for the condition  GOAL:  Patient will meet greater than or equal to 90% of their needs  MONITOR:  PO intake, Supplement acceptance, Labs, I & O's  REASON FOR ASSESSMENT:  Malnutrition Screening Tool    ASSESSMENT:  59 y.o. male PMhx, DM2, chronic pancreatitis, hepatitis C, substance abuse,  cirrhosis of the liver and hepatocellular carcinoma. Presents after his lab worked revealed a calcium level of 12.4. Denies feeling any different than usual  Spoke with patient about how he is doing with his cancer treatment. He states that at home he is eating 3 meals a day. He does not follow a carbohydrate consistent or DM diet. I asked what his normal CBGs were and he just stated "low" . I asked if that meant below 100 and he said yes. Based on his last A1C two months ago and not following a special diet, I question his response.   He states he has been losing weight. 6 months ago he says he weighed 145 lbs. We talked about the benefit of oral nutritional supplements to help maintain weight. He states he is not allowed to have Ensure/Boost because of DM. I asked about glucerna which he said he had never heard of. Will order BID to let him try.   Had also seen in notes that he had admitted to poor fluid intake. I left a oncology handout titled "Dehydration" and talked about some ways he can ensure he is drinking enough such as by keeping a bottle of water on him throughout the day, having commercial que him to drink (when watching tv), or simply asking the nurse at his assisted living facility to help him.    Unfortunately, patient  did not seem to be too interested and had very little to say.  NFPE: Unable to assess as he was wrapped up tightly in his bed sheets   Diet Order:  Diet Carb Modified Fluid consistency:: Thin; Room service appropriate?: Yes  Skin:  Reviewed, no issues  Last BM:  8/15  Height:  Ht Readings from Last 1 Encounters:  12/09/14 5\' 4"  (1.626 m)   Weight:  Wt Readings from Last 1 Encounters:  12/09/14 122 lb 2.2 oz (55.4 kg)   Wt Readings from Last 10 Encounters:  12/09/14 122 lb 2.2 oz (55.4 kg)  11/12/14 126 lb 1.6 oz (57.199 kg)  10/11/14 130 lb (58.968 kg)  10/08/14 133 lb 4.8 oz (60.464 kg)  08/13/14 132 lb 8 oz (60.102 kg)  06/05/14 130 lb (58.968 kg)  06/05/14 140 lb (63.504 kg)  03/13/14 130 lb (58.968 kg)  01/25/14 132 lb 15 oz (60.3 kg)  01/04/14 133 lb (60.328 kg)   Ideal Body Weight:  59.1 kg  BMI:  Body mass index is 20.95 kg/(m^2).  Estimated Nutritional Needs:  Kcal:  1650-1850 (30-33 kcal/kg) Protein:  71-83 (1.2-1.4 g/kg Ibw) Fluid:  1.7-1.9 liters  EDUCATION NEEDS:  Education needs addressed  Burtis Junes RD, LDN Nutrition Pager: [phone] 12/10/2014 12:17 PM

## 2014-12-11 LAB — CALCIUM, IONIZED: Calcium, Ionized, Serum: 6.5 mg/dL — ABNORMAL HIGH (ref 4.5–5.6)

## 2014-12-12 LAB — PTH, INTACT AND CALCIUM: PTH: 5 pg/mL — ABNORMAL LOW (ref 15–65)

## 2015-01-02 ENCOUNTER — Ambulatory Visit (INDEPENDENT_AMBULATORY_CARE_PROVIDER_SITE_OTHER): Payer: Medicare Other | Admitting: Internal Medicine

## 2015-01-02 ENCOUNTER — Encounter (INDEPENDENT_AMBULATORY_CARE_PROVIDER_SITE_OTHER): Payer: Self-pay | Admitting: Internal Medicine

## 2015-01-02 VITALS — BP 112/62 | HR 56 | Temp 98.1°F | Ht 64.0 in | Wt 124.6 lb

## 2015-01-02 DIAGNOSIS — C220 Liver cell carcinoma: Secondary | ICD-10-CM | POA: Diagnosis not present

## 2015-01-02 NOTE — Progress Notes (Signed)
Subjective:    Patient ID: Nicholas Becker, male    DOB: 03-13-1956, 59 y.o.   MRN: [id number]  HPI Here today for f/u of his [place]. Hx of Hepatitis C. Hx of cirrhosis secondary to chronic Hepatitis C about 8 yrs ago.     Marland Kitchen He was found to have [place] on screening. He underwent percutaneious blation of lesion in segment 4 and 5 in September 2015 and chemotherapy embolization of lesion in segment 2 in October 2015. He responded nicely with drop in AFP levels. He was last seen by Dr Laural Golden IN April of this year. He underwent an MRI and referred to Dr. Monica Martinez. Per Dr. Monica Martinez notes patient has advanced [place] with left portal vein thrombosis, so surgical options are not available to him. They recommended TARE versus sorafenib Dr. Monica Martinez spoke with patient 12/2014: Patient told he had advanced [place] that has progressive further from his last scan and than they would not be able to offer further ablation.  His care per notes could be done locally (e.g. Sorafenib or palliative). He will be  referred to a local oncologist.   He tells me he is doing okay. He c/o some back pain today. His appetite is okay. He has lost about 1 pound since his last visit in July. He has had dental work and has not eaten has much. He had multiple dental extractions.  Weight in July was 126.1  BMs are normal. No melena or BRRB. BM daily.  Has appt with Dr Whitney Muse 01/10/2015 at 2pm.        08/20/2014 MRI Abdomen: rising AFP, Hepatitis C.  IMPRESSION: 1. Similar appearance of treatment changes of ablation within segment 4/5 and chemo embolization within segment 2. No convincing evidence of locally recurrent disease at either site. 2. Progressive thrombus within the left portal vein with surrounding signal abnormality and venous enlargement. Findings are highly suspicious for malignant thrombus and direct tumor involvement of the surrounding central left hepatic lobe. Although some of the more diffuse hypo enhancement could be  due to venous thrombosis, the extent of T2 hyperintensity and restricted diffusion strongly suggests central tumor. 3. Similar appearance of left lower lobe process which is likely due to prior infection, superimposed upon bullous disease. 4. Chronic pancreatitis. 5. Cholelithiasis. 6. Mild motion degradation.       CMP Latest Ref Rng 12/10/2014 12/10/2014 12/09/2014  Glucose 65 - 99 mg/dL 249(H) 47(L) 331(H)  BUN 6 - 20 mg/dL 7 5(L) 8  Creatinine 0.61 - 1.24 mg/dL 0.58(L) 0.45(L) 0.72  Sodium 135 - 145 mmol/L 132(L) 135 130(L)  Potassium 3.5 - 5.1 mmol/L 4.0 2.9(L) 3.3(L)  Chloride 101 - 111 mmol/L 100(L) 103 95(L)  CO2 22 - 32 mmol/L 25 27 25   Calcium 8.9 - 10.3 mg/dL 9.5 10.2 11.6(H)  Total Protein 6.5 - 8.1 g/dL - 6.3(L) 7.9  Total Bilirubin 0.3 - 1.2 mg/dL - 0.9 0.7  Alkaline Phos 38 - 126 U/L - 170(H) 222(H)  AST 15 - 41 U/L - 64(H) 79(H)  ALT 17 - 63 U/L - 27 29    CBC    Component Value Date/Time   WBC 5.4 12/10/2014 0425   RBC 3.53* 12/10/2014 0425   HGB 9.9* 12/10/2014 0425   HCT 28.8* 12/10/2014 0425   PLT 147* 12/10/2014 0425   MCV 81.6 12/10/2014 0425   MCH 28.0 12/10/2014 0425   MCHC 34.4 12/10/2014 0425   RDW 16.8* 12/10/2014 0425   LYMPHSABS 1.7 12/09/2014 1746  MONOABS 0.8 12/09/2014 1746   EOSABS 0.1 12/09/2014 1746   BASOSABS 0.0 12/09/2014 1746        10/04/2014 ALP 260, AST 41, ALT 23 H and H 12.6 and 38.5  AFP was 619.4 on 10/12/2013  AFP was 11370 on 11/29/2013 AFP was 718.6 on 01/30/2014. AFP was 512.2 on 04/01/2014 AFP was 954.9 on 05/28/2014    Review of Systems Past Medical History  Diagnosis Date  . Diabetes mellitus   . Hypertension     normal Bmet in 2012  . Hyperlipemia   . Chronic pancreatitis     calcified  . Tobacco abuse     1/2 pack per day  . Cholelithiasis 2009  . Bronchiectasis 2009    Left lower lobe; normal chest x-ray in 2012  . Cataracts, bilateral   . Overweight(278.02)   . 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 Nephrolithiasis 12/31/2010  . Substance abuse     Cocaine, marijuana, alcohol, stopped 10 yrs ago  . Pancreatitis   . GERD (gastroesophageal reflux disease)   . Chronic hepatitis C with cirrhosis 2006    Secondary to hepatitis C and ethanol abuse, which is now in remission; varices-status post banding at [facility]; normal LFTs in 2009; thrombocytopenia In the past, platelets-153,000 in 2012  . Cirrhosis   . Esophageal varices     Past Surgical History  Procedure Laterality Date  . Percutaneous liver biopsy  2006  . Multiple extractions with alveoloplasty N/A 10/11/2014    Procedure: MULTIPLE EXTRACTIONS: Teeth # 1, 4, 6, 9, 10, 11, 12, 20, 28,  WITH ALVEOLOPLASTY AND REMOVAL OF BILATREAL MANDIBULAR TORI;  Surgeon: Diona Browner, DDS;  Location: [facility];  Service: Oral Surgery;  Laterality: N/A;    No Known Allergies  Current Outpatient Prescriptions on File Prior to Visit  Medication Sig Dispense Refill  . amLODipine (NORVASC) 5 MG tablet Take 5 mg by mouth every morning.     Marland Kitchen aspirin 81 MG chewable tablet Chew 81 mg by mouth every morning.    . Cholecalciferol (VITAMIN D) 2000 UNITS tablet Take 2,000 Units by mouth every morning.     . citalopram (CELEXA) 40 MG tablet Take 40 mg by mouth every morning.     . insulin detemir (LEVEMIR) 100 UNIT/ML injection Inject 41 Units into the skin every morning.     . insulin lispro (HUMALOG) 100 UNIT/ML injection Inject 5 Units into the skin 3 (three) times daily before meals.    Marland Kitchen lisinopril (PRINIVIL,ZESTRIL) 40 MG tablet Take 1 tablet (40 mg total) by mouth daily. 30 tablet 1  . metFORMIN (GLUCOPHAGE) 1000 MG tablet Take 1,000 mg by mouth 2 (two) times daily with a meal.     . Multiple Vitamin (MULTIVITAMIN) tablet Take 1 tablet by mouth every morning.     . nadolol (CORGARD) 80 MG tablet Take 80 mg by mouth every morning.     . Omega-3 Fatty Acids (FISH OIL) 1000 MG CAPS Take 1,000 mg by mouth every morning.     Marland Kitchen omeprazole  (PRILOSEC) 20 MG capsule Take 20 mg by mouth every morning.     . simvastatin (ZOCOR) 10 MG tablet Take 10 mg by mouth at bedtime.     . traZODone (DESYREL) 150 MG tablet Take 75 mg by mouth at bedtime.      No current facility-administered medications on file prior to visit.        Objective:   Physical ExamBlood pressure  112/62, pulse 56, temperature 98.1 F (36.7 C), height 5\' 4"  (1.626 m), weight 124 lb 9.6 oz (56.518 kg). Alert and oriented. Skin warm and dry. Oral mucosa is moist.   . Sclera anicteric, conjunctivae is pink. Thyroid not enlarged. No cervical lymphadenopathy. Lungs clear. Heart regular rate and rhythm.  Abdomen is soft. Bowel sounds are positive. No hepatomegaly. No abdominal masses felt. No tenderness.  No edema to lower extremities.  Reddened area to left mid abdomen from insulin injection,.         Assessment & Plan:  Advanced [place]. Keep apt with Dr. Whitney Muse.  OV in 6 months. Cellulitis left abdomen: Warm compresses to area.

## 2015-01-02 NOTE — Patient Instructions (Signed)
OV in 6 months. 

## 2015-01-10 ENCOUNTER — Encounter (HOSPITAL_COMMUNITY): Payer: Medicare Other | Attending: Hematology & Oncology | Admitting: Hematology & Oncology

## 2015-01-10 ENCOUNTER — Encounter (HOSPITAL_COMMUNITY): Payer: Self-pay | Admitting: Hematology & Oncology

## 2015-01-10 VITALS — BP 133/74 | HR 69 | Temp 98.0°F | Resp 18 | Wt 126.8 lb

## 2015-01-10 DIAGNOSIS — C220 Liver cell carcinoma: Secondary | ICD-10-CM | POA: Diagnosis present

## 2015-01-10 LAB — CBC WITH DIFFERENTIAL/PLATELET
Basophils Absolute: 0 10*3/uL (ref 0.0–0.1)
Basophils Relative: 0 %
EOS ABS: 0.1 10*3/uL (ref 0.0–0.7)
EOS PCT: 1 %
HCT: 33 % — ABNORMAL LOW (ref 39.0–52.0)
Hemoglobin: 11.3 g/dL — ABNORMAL LOW (ref 13.0–17.0)
LYMPHS ABS: 2 10*3/uL (ref 0.7–4.0)
Lymphocytes Relative: 22 %
MCH: 27.8 pg (ref 26.0–34.0)
MCHC: 34.2 g/dL (ref 30.0–36.0)
MCV: 81.1 fL (ref 78.0–100.0)
MONOS PCT: 9 %
Monocytes Absolute: 0.8 10*3/uL (ref 0.1–1.0)
Neutro Abs: 6.2 10*3/uL (ref 1.7–7.7)
Neutrophils Relative %: 68 %
PLATELETS: 258 10*3/uL (ref 150–400)
RBC: 4.07 MIL/uL — ABNORMAL LOW (ref 4.22–5.81)
RDW: 19.1 % — ABNORMAL HIGH (ref 11.5–15.5)
WBC: 9.1 10*3/uL (ref 4.0–10.5)

## 2015-01-10 LAB — COMPREHENSIVE METABOLIC PANEL
ALK PHOS: 483 U/L — AB (ref 38–126)
ALT: 58 U/L (ref 17–63)
ANION GAP: 8 (ref 5–15)
AST: 138 U/L — ABNORMAL HIGH (ref 15–41)
Albumin: 2.5 g/dL — ABNORMAL LOW (ref 3.5–5.0)
BUN: 6 mg/dL (ref 6–20)
CALCIUM: 10.3 mg/dL (ref 8.9–10.3)
CO2: 23 mmol/L (ref 22–32)
Chloride: 103 mmol/L (ref 101–111)
Creatinine, Ser: 0.49 mg/dL — ABNORMAL LOW (ref 0.61–1.24)
Glucose, Bld: 75 mg/dL (ref 65–99)
Potassium: 3.7 mmol/L (ref 3.5–5.1)
SODIUM: 134 mmol/L — AB (ref 135–145)
Total Bilirubin: 2.4 mg/dL — ABNORMAL HIGH (ref 0.3–1.2)
Total Protein: 7.5 g/dL (ref 6.5–8.1)

## 2015-01-10 LAB — AMMONIA: AMMONIA: 24 umol/L (ref 9–35)

## 2015-01-10 MED ORDER — NYSTATIN 100000 UNIT/ML MT SUSP: 5.0000 mL | Freq: Four times a day (QID) | OROMUCOSAL | Status: AC

## 2015-01-10 NOTE — Progress Notes (Signed)
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 male is here on referral from Dr. Laural Golden because of his hepatocellular carcinoma. [name] is here today with four women, including his caregiver, mother, sister-in-law, and sister.  Available records the patient was referred for palliative TACE versus sorafinib in June 2016. He was referred to medical oncology at [facility]. He apparently declined that appointment. The patient stated he would follow up in [place] however he did not. He was seen again at [facility] on September 8.  His family and the patient are not unrealistic. Hospice was openly discussed.  He does not know if his ammonia levels have ever been high or not.  He is aware that hospice is a reasonable option but he also notes he was offered a "pill." He would like to discuss whether or not he is a candidate for the pill.  Currently, he has a lesion that he needs biopsied in his jaw. He had his teeth pulled in June, and at his  6 week checkup they discovered the lesion.  [name] has lost about 20 pounds over the past year. He describes his appetite is poor. He has Glucerna but does not care for the taste. He does not consume them on a regular basis.  With regards to his bowels, he confirms that they are moving. He provides no comment on whether or not he is constipated.  He confirms that he is getting up and getting dressed every day. He lives in an assisted living facility and has done so for the last 15 years. He notes that he is not very active and complains of fatigue.   MEDICAL HISTORY:  Past Medical History  Diagnosis Date  . Diabetes mellitus   . Hypertension     normal Bmet in 2012  . Hyperlipemia   . Chronic pancreatitis     calcified  . Tobacco abuse     1/2 pack per day  . Cholelithiasis 2009  . Bronchiectasis 2009    Left lower lobe; normal chest x-ray in 2012  . Cataracts, bilateral   . Overweight(278.02)   . 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 Nephrolithiasis 12/31/2010  . Substance abuse     Cocaine, marijuana, alcohol, stopped 10 yrs ago  . Pancreatitis   . GERD (gastroesophageal reflux disease)   . Chronic hepatitis C with cirrhosis 2006    Secondary to hepatitis C and ethanol abuse, which is now in remission; varices-status post banding at [facility]; normal LFTs in 2009; thrombocytopenia In the past, platelets-153,000 in 2012  . Cirrhosis   . Esophageal varices     SURGICAL HISTORY: Past Surgical History  Procedure Laterality Date  . Percutaneous liver biopsy  2006  . Multiple extractions with alveoloplasty N/A 10/11/2014    Procedure: MULTIPLE EXTRACTIONS: Teeth # 1, 4, 6, 9, 10, 11, 12, 20, 28,  WITH ALVEOLOPLASTY AND REMOVAL OF BILATREAL MANDIBULAR TORI;  Surgeon: Diona Browner, DDS;  Location: [facility];  Service: Oral Surgery;  Laterality: N/A;    SOCIAL HISTORY: Social History   Social History  . Marital Status: Divorced    Spouse Name: N/A  . Number of Children: N/A   . Years of Education: N/A   Occupational History  . Not on file.   Social History Main Topics  . Smoking status: Current Every Day Smoker -- 0.50 packs/day for 30 years    Types: Cigarettes  . Smokeless tobacco: Never Used     Comment: Patient smokes 1 pack a day  . Alcohol Use: 0.0 oz/week    0 Standard drinks or equivalent per week  . Drug Use: No     Comment: quit coaine alcohol and marijuana 10 years ago  . Sexual Activity: No   Other Topics Concern  . Not on file   Social History Narrative   Two children; no grandchildren Born in [place] Worked for a [employer]   His hepatitis was cured  FAMILY HISTORY: Family History  Problem Relation Age of Onset  . Healthy Sister   . Lung cancer Brother   . Healthy Sister   . Healthy Sister   . Healthy Sister   . Healthy Brother   . Healthy Brother   . Diabetes Daughter   . Healthy Son    indicated that his mother is alive. He indicated that his father is deceased. He indicated that all of his four sisters are alive. He indicated that two of his three brothers are alive. He indicated that his daughter is alive. He indicated that his son is alive.   Mother is 31, soon to be 76 Father deceased in a car accident Four sisters, three brothers; one brother deceased from liver cancer (had alcohol problems)  ALLERGIES:  has No Known Allergies.  MEDICATIONS:  Current Outpatient Prescriptions  Medication Sig Dispense Refill  . amLODipine (NORVASC) 5 MG tablet Take 5 mg by mouth every morning.     Marland Kitchen aspirin 81 MG chewable tablet Chew 81 mg by mouth every morning.    . Cholecalciferol (VITAMIN D) 2000 UNITS tablet Take 2,000 Units by mouth every morning.     . citalopram (CELEXA) 40 MG tablet Take 40 mg by mouth every morning.     Marland Kitchen ibuprofen (ADVIL,MOTRIN) 800 MG tablet Take 800 mg by mouth 3 (three) times daily as needed.    . insulin detemir (LEVEMIR) 100 UNIT/ML injection Inject 41 Units into the skin  every morning.     . insulin lispro (HUMALOG) 100 UNIT/ML injection Inject 5 Units into the skin 3 (three) times daily before meals.    Marland Kitchen lisinopril (PRINIVIL,ZESTRIL) 40 MG tablet Take 1 tablet (40 mg total) by mouth daily. 30 tablet 1  . metFORMIN (GLUCOPHAGE) 1000 MG tablet Take 1,000 mg by mouth 2 (two) times daily with a meal.     . Multiple Vitamin (MULTIVITAMIN) tablet Take 1 tablet by mouth every morning.     . nadolol (CORGARD) 80 MG tablet Take 80 mg by mouth every morning.     . Omega-3 Fatty Acids (FISH OIL) 1000 MG CAPS Take 1,000 mg by mouth every morning.     Marland Kitchen  omeprazole (PRILOSEC) 20 MG capsule Take 20 mg by mouth every morning.     . simvastatin (ZOCOR) 10 MG tablet Take 10 mg by mouth at bedtime.     . traZODone (DESYREL) 150 MG tablet Take 75 mg by mouth at bedtime.     Marland Kitchen nystatin (MYCOSTATIN) 100000 UNIT/ML suspension Take 5 mLs (500,000 Units total) by mouth 4 (four) times daily. 60 mL 0   No current facility-administered medications for this visit.    Review of Systems  Constitutional: Positive for weight loss and malaise/fatigue.  HENT: Negative.        Has a lesion in his jaw that he is getting biopsied soon.  Eyes: Negative.   Respiratory: Negative.   Cardiovascular: Negative.   Gastrointestinal: Positive for abdominal pain. Negative for nausea and vomiting.       Swollen belly that he noticed recently. Family remarks that it's been swollen for about a month.  Genitourinary: Positive for frequency.  Musculoskeletal: Positive for back pain and joint pain.  Skin: Negative.   Neurological: Positive for weakness. Negative for dizziness, tingling, tremors, seizures and loss of consciousness.  Endo/Heme/Allergies: Negative.   Psychiatric/Behavioral: Negative.   All other systems reviewed and are negative.  14 point ROS was done and is otherwise as detailed above or in HPI  PHYSICAL EXAMINATION: ECOG PERFORMANCE STATUS: 2 - Symptomatic, <50% confined to  bed  Filed Vitals:   01/10/15 1410  BP: 133/74  Pulse: 69  Temp: 98 F (36.7 C)  Resp: 18   Filed Weights   01/10/15 1410  Weight: 126 lb 12.8 oz (57.516 kg)     Physical Exam  Constitutional: He is oriented to person, place, and time. No distress.  Thin, appears chronically ill  HENT:  Head: Normocephalic and atraumatic.  Nose: Nose normal.  Mouth/Throat: Oropharynx is clear and moist. No oropharyngeal exudate.  Has thrush.  Eyes: Conjunctivae and EOM are normal. Pupils are equal, round, and reactive to light. Right eye exhibits no discharge. Left eye exhibits no discharge. No scleral icterus.  Neck: Normal range of motion. Neck supple. No tracheal deviation present. No thyromegaly present.  Cardiovascular: Normal rate, regular rhythm and normal heart sounds.  Exam reveals no gallop and no friction rub.   No murmur heard. Pulmonary/Chest: Effort normal and breath sounds normal. No respiratory distress. He has no wheezes. He has no rales.  Abdominal: Soft. Bowel sounds are normal. He exhibits distension. He exhibits no mass. There is no tenderness. There is no rebound and no guarding.  Swollen, tight abdomen.  Musculoskeletal: Normal range of motion. He exhibits edema. He exhibits no tenderness.  Lymphadenopathy:    He has no cervical adenopathy.  Neurological: He is alert and oriented to person, place, and time. He has normal reflexes. No cranial nerve deficit. Coordination normal.  Gait is slow, came into clinic with a wheelchair but ambulates on his own  Skin: Skin is warm and dry. No rash noted. He is not diaphoretic.  Psychiatric: Mood and affect normal.  Nursing note and vitals reviewed.   LABORATORY DATA:  I have reviewed the data as listed Results for VIRAT, PRATHER (MRN [id number])   Ref. Range 01/10/2015 15:10  Sodium Latest Ref Range: 135-145 mmol/L 134 (L)  Potassium Latest Ref Range: 3.5-5.1 mmol/L 3.7  Chloride Latest Ref Range: 101-111 mmol/L 103  CO2  Latest Ref Range: 22-32 mmol/L 23  BUN Latest Ref Range: 6-20 mg/dL 6  Creatinine Latest Ref Range: 0.61-1.24 mg/dL 0.49 (L)  Calcium Latest Ref Range: 8.9-10.3 mg/dL 10.3  EGFR (Non-African Amer.) Latest Ref Range: >60 mL/min >60  EGFR (African American) Latest Ref Range: >60 mL/min >60  Glucose Latest Ref Range: 65-99 mg/dL 75  Anion gap Latest Ref Range: 5-15  8  Alkaline Phosphatase Latest Ref Range: 38-126 U/L 483 (H)  Albumin Latest Ref Range: 3.5-5.0 g/dL 2.5 (L)  AST Latest Ref Range: 15-41 U/L 138 (H)  ALT Latest Ref Range: 17-63 U/L 58  Total Protein Latest Ref Range: 6.5-8.1 g/dL 7.5  Ammonia Latest Ref Range: 9-35 umol/L 24  Total Bilirubin Latest Ref Range: 0.3-1.2 mg/dL 2.4 (H)  WBC Latest Ref Range: 4.0-10.5 K/uL 9.1  RBC Latest Ref Range: 4.22-5.81 MIL/uL 4.07 (L)  Hemoglobin Latest Ref Range: 13.0-17.0 g/dL 11.3 (L)  HCT Latest Ref Range: 39.0-52.0 % 33.0 (L)  MCV Latest Ref Range: 78.0-100.0 fL 81.1  MCH Latest Ref Range: 26.0-34.0 pg 27.8  MCHC Latest Ref Range: 30.0-36.0 g/dL 34.2  RDW Latest Ref Range: 11.5-15.5 % 19.1 (H)  Platelets Latest Ref Range: 150-400 K/uL 258  Neutrophils Latest Units: % 68  Lymphocytes Latest Units: % 22  Monocytes Relative Latest Units: % 9  Eosinophil Latest Units: % 1  Basophil Latest Units: % 0  NEUT# Latest Ref Range: 1.7-7.7 K/uL 6.2  Lymphocyte # Latest Ref Range: 0.7-4.0 K/uL 2.0  Monocyte # Latest Ref Range: 0.1-1.0 K/uL 0.8  Eosinophils Absolute Latest Ref Range: 0.0-0.7 K/uL 0.1  Basophils Absolute Latest Ref Range: 0.0-0.1 K/uL 0.0     RADIOGRAPHIC STUDIES:  EXAM: MRI abdomen with and without contrast DATE: 12/25/14 18:35:00 ACCESSION: [id number] UN DICTATED: 12/26/14 08:05:53 INTERPRETATION LOCATION: [facility]  CLINICAL INDICATION: 59 Year Old (M): Hepatitis C and alcohol cirrhosis. History of hepatocellular carcinoma status post segment 4/5 ablation and segment 2 TACE.   COMPARISON: Outside MRI abdomen  08/20/2014.  TECHNIQUE: MRI of the abdomen was obtained with and without IV contrast. Multisequence, multiplanar images were obtained.      FINDINGS: Lobulated T1 hyperintense/T2 hypointense lesion in the left lower lobe and along the left hemidiaphragm, similar to prior. No pleural effusion.  Nodular liver contour with caudate and left hepatic lobe hypertrophy, consistent with cirrhosis. Nonenhancing T1/T2 hypointense lesion in segment IV, likely sequela of prior ablation. Ill-defined, diffuse slightly increased T2 signal throughout the left hepatic lobe with invasion and expansion of the right and left portal veins. Invasion of the right portal vein is new compared to prior. The right hepatic vein is patent. The left hepatic vein is again compressed.   Gallstones in an otherwise normal gallbladder. There is no intra- or extrahepatic biliary ductal dilatation. The spleen is normal in appearance and signal intensity.  The pancreas is normal in appearance and signal intensity.  The adrenal glands are normal in appearance and signal intensity. Unchanged tiny left renal cysts. The right kidney is unremarkable.  The aorta, IVC, and mesenteric vessels are patent. The splenic vein and SMV are patent.   There is diffuse gastric wall thickening, new compared to prior. There is no evidence of bowel obstruction.  There is no retroperitoneal adenopathy.  Moderate volume ascites.   There are no soft tissue lesions. Bone marrow signal intensity is within normal limits.  IMPRESSION:  -- Progression of diffuse HCC in the left hepatic lobe. Tumor thrombus in the right and left portal veins, increased compared to prior. -- Cirrhosis. New moderate ascites. -- Diffuse gastric wall thickening. -- Proteinaceous/hemorrhagic localized pleural collection, unchanged from prior. As suggested may be  the sequela of prior trauma.  ASSESSMENT & PLAN:  Hepatocellular carcinoma, macrovascular invasion Tumor  thrombus in right and left portal veins Cirrhosis, moderate ascites History of chronic pancreatitis Hepatitis C having received pegylated interferon and ribavirin History of alcohol abuse Marginal performance status Hypercalcemia of malignancy  The family and caregiver are not unrealistic about the patient's prognosis. The patient seems to have some understanding that his cancer is terminal. We discussed hospice in detail. I advised him he is a perfect candidate for hospice. They are not opposed to a hospice referral.  The patient and his family and I discussed Nexavar as a potential therapy. The patient openly expressed that he would like to try treatment if possible. I advised him that based upon his albumin and bilirubin level I think his tolerance may be marginal. I am certainly not opposed to trying therapy. I would recommend however starting at a lower dose and increasing it if tolerated. We discussed that we can discontinue this therapy at any point and refer him for palliative care/hospice care.  I also spoke with Mr. Schwer at length about the benefits of palliative care, treatment vs symptom management, and the goal to help him attain high quality of life for the rest of his life. He seemed to have an understanding. His concern was being able to stay at his current residence.  I have written a prescription for Nexavar. Once he receives his medication he will come to the clinic for formal teaching. We will follow him closely in regards to tolerance and side effects.  I have set him up for a paracentesis. He is remarks several times that his abdomen is becoming uncomfortable. I explained the cause of his ascites to the patient and family.   Orders Placed This Encounter  Procedures  . US Paracentesis    Standing Status: Future     Number of Occurrences:      Standing Expiration Date: 01/10/2016    Order Specific Question:  If therapeutic, is there a maximum amount of fluid to be  removed?    Answer:  No    Order Specific Question:  Are labs required for specimen collection?    Answer:  No    Order Specific Question:  Reason for Exam (SYMPTOM  OR DIAGNOSIS REQUIRED)    Answer:  [place], cirrhosis, abdominal swelling    Order Specific Question:  Preferred imaging location?    Answer:  [facility]  . CBC with Differential    Standing Status: Future     Number of Occurrences: 1     Standing Expiration Date: 01/10/2016  . Comprehensive metabolic panel    Standing Status: Future     Number of Occurrences: 1     Standing Expiration Date: 01/10/2016  . Ammonia    Standing Status: Future     Number of Occurrences: 1     Standing Expiration Date: 01/10/2016     All questions were answered. The patient knows to call the clinic with any problems, questions or concerns. This note was electronically signed.    Molli Hazard, MD  01/10/2015 3:27 PM

## 2015-01-10 NOTE — Patient Instructions (Addendum)
..  [facility] at [facility] Discharge Instructions  RECOMMENDATIONS MADE BY THE CONSULTANT AND ANY TEST RESULTS WILL BE SENT TO YOUR REFERRING PHYSICIAN.  Exam per Dr. Whitney Muse Prescription for mouthwash--you have thrush We are going to set up a paracentesis to drain fluid off your abdomen Dr. Whitney Muse is working on getting you a medication to treat your cancer  Lab work today  Thank you for choosing [facility] at [facility] to provide your oncology and hematology care.  To afford each patient quality time with our provider, please arrive at least 15 minutes before your scheduled appointment time.    You need to re-schedule your appointment should you arrive 10 or more minutes late.  We strive to give you quality time with our providers, and arriving late affects you and other patients whose appointments are after yours.  Also, if you no show three or more times for appointments you may be dismissed from the clinic at the providers discretion.     Again, thank you for choosing [facility]  Our hope is that these requests will decrease the amount of time that you wait before being seen by our physicians.       _____________________________________________________________  Should you have questions after your visit to [facility], please contact our office at [phone] between the hours of 8:30 a.m. and 4:30 p.m.  Voicemails left after 4:30 p.m. will not be returned until the following business day.  For prescription refill requests, have your pharmacy contact our office.

## 2015-01-14 ENCOUNTER — Ambulatory Visit (INDEPENDENT_AMBULATORY_CARE_PROVIDER_SITE_OTHER): Payer: Medicare Other | Admitting: Internal Medicine

## 2015-01-14 MED ORDER — SORAFENIB TOSYLATE 200 MG PO TABS: 200.0000 mg | ORAL_TABLET | Freq: Two times a day (BID) | ORAL | Status: DC

## 2015-01-15 ENCOUNTER — Other Ambulatory Visit (HOSPITAL_COMMUNITY): Payer: Self-pay | Admitting: Hematology & Oncology

## 2015-01-15 ENCOUNTER — Ambulatory Visit (HOSPITAL_COMMUNITY)
Admission: RE | Admit: 2015-01-15 | Discharge: 2015-01-15 | Disposition: A | Discharge status: Home / Self Care | Payer: Medicare Other | Source: Ambulatory Visit | Attending: Hematology & Oncology | Admitting: Hematology & Oncology

## 2015-01-15 DIAGNOSIS — R188 Other ascites: Secondary | ICD-10-CM | POA: Diagnosis present

## 2015-01-15 DIAGNOSIS — C220 Liver cell carcinoma: Secondary | ICD-10-CM

## 2015-01-15 NOTE — Discharge Instructions (Signed)
Paracentesis °Paracentesis is a procedure used to remove excess fluid from the belly (abdomen). Excess fluid in the belly is called ascites. Excess fluid can be the result of certain conditions, such as infection, inflammation, abdominal injury, heart failure, chronic scarring of the liver (cirrhosis), or cancer. The excess fluid is removed using a needle inserted through the skin and tissue into the abdomen.  °A paracentesis may be done to: °· Determine the cause of the excess fluid through examination of the fluid. °· Relieve symptoms of shortness of breath or pain caused by the excess fluid. °· Determine presence of bleeding after an abdominal injury. °LET YOUR CAREGIVERS KNOW ABOUT: °· Allergies. °· Medications taken including herbs, eye drops, over-the-counter medications, and creams. °· Use of steroids (by mouth or creams). °· Previous problems with anesthetics or numbing medicine. °· Possibility of pregnancy, if this applies. °· History of blood clots (thrombophlebitis). °· History of bleeding or blood problems. °· Previous surgery. °· Other health problems. °RISKS AND COMPLICATIONS °· Injury to an abdominal organ, such as the bowel (large intestine), liver, spleen, or bladder. °· Possible infection. °· Bleeding. °· Low blood pressure (hypotension). °BEFORE THE PROCEDURE °This is a procedure that can be done as an outpatient. Confirm the time that you need to arrive for your procedure. A blood sample may be done to determine your blood clotting time. The presence of a severe bleeding disorder (coagulopathy) which cannot be promptly corrected may make this procedure inadvisable. You may be asked to urinate. °PROCEDURE °The procedure will take about 30 minutes. This time will vary depending on the amount of fluid that is removed. You may be asked to lie on your back with your head elevated. An area on your abdomen will be cleansed. A numbing medicine may then be injected (local anesthesia) into the skin and  tissue. A needle is inserted through your abdominal skin and tissues until it is positioned in your abdomen. You may feel pressure or slight pain as the needle is positioned into the abdomen. Fluid is removed from the abdomen through the needle. Tell your caregiver if you feel dizzy or lightheaded. The needle is withdrawn once the desired amount of fluid has been removed. A sample of the fluid may be sent for examination.  °AFTER THE PROCEDURE °Your recovery will be assessed and monitored. If there are no problems, as an outpatient, you should be able to go home shortly after the procedure. There may be a very limited amount of clear fluid draining from the needle insertion site over the next 2 days. Confirm with your caregiver as to the expected amount of drainage. °Obtaining the Test Results °It is your responsibility to obtain your test results. Do not assume everything is normal if you have not heard from your caregiver or the medical facility. It is important for you to follow up on all of your test results. °HOME CARE INSTRUCTIONS  °· You may resume normal diet and activities as directed or allowed. °· Only take over-the-counter or prescription medicines for pain, discomfort, or fever as directed by your caregiver. °SEEK IMMEDIATE MEDICAL CARE IF: °· You develop shortness of breath or chest pain. °· You develop increasing pain, discomfort, or swelling in your abdomen. °· You develop new drainage or pus coming from site where fluid was removed. °· You develop swelling or increased redness from site where fluid was removed. °· You develop an unexplained temperature of 102° F (38.9° C) or above. °Document Released: 10/26/2004 Document Revised: 07/05/2011 Document   Reviewed: 12/02/2008 °Exit[employer]® Patient Information ©2015 [employer]. This information is not intended to replace advice given to you by your health care provider. Make sure you discuss any questions you have with your health care provider. ° °

## 2015-01-15 NOTE — Progress Notes (Signed)
Paracentesis complete no signs of distress. 1800 ml amber colored ascites removed.

## 2015-01-16 ENCOUNTER — Ambulatory Visit (INDEPENDENT_AMBULATORY_CARE_PROVIDER_SITE_OTHER): Payer: Medicare Other | Admitting: Otolaryngology

## 2015-01-16 DIAGNOSIS — D3701 Neoplasm of uncertain behavior of lip: Secondary | ICD-10-CM

## 2015-01-21 ENCOUNTER — Emergency Department (HOSPITAL_COMMUNITY)
Admission: EM | Admit: 2015-01-21 | Discharge: 2015-01-21 | Disposition: A | Discharge status: Home / Self Care | Payer: Medicare Other | Attending: Emergency Medicine | Admitting: Emergency Medicine

## 2015-01-21 ENCOUNTER — Telehealth (HOSPITAL_COMMUNITY): Payer: Self-pay | Admitting: *Deleted

## 2015-01-21 ENCOUNTER — Encounter (HOSPITAL_COMMUNITY): Payer: Self-pay | Admitting: *Deleted

## 2015-01-21 DIAGNOSIS — Z8619 Personal history of other infectious and parasitic diseases: Secondary | ICD-10-CM | POA: Diagnosis not present

## 2015-01-21 DIAGNOSIS — Z79899 Other long term (current) drug therapy: Secondary | ICD-10-CM | POA: Insufficient documentation

## 2015-01-21 DIAGNOSIS — R17 Unspecified jaundice: Secondary | ICD-10-CM | POA: Insufficient documentation

## 2015-01-21 DIAGNOSIS — Z87442 Personal history of urinary calculi: Secondary | ICD-10-CM | POA: Diagnosis not present

## 2015-01-21 DIAGNOSIS — K219 Gastro-esophageal reflux disease without esophagitis: Secondary | ICD-10-CM | POA: Insufficient documentation

## 2015-01-21 DIAGNOSIS — Z8669 Personal history of other diseases of the nervous system and sense organs: Secondary | ICD-10-CM | POA: Diagnosis not present

## 2015-01-21 DIAGNOSIS — E162 Hypoglycemia, unspecified: Secondary | ICD-10-CM

## 2015-01-21 DIAGNOSIS — E11649 Type 2 diabetes mellitus with hypoglycemia without coma: Secondary | ICD-10-CM | POA: Insufficient documentation

## 2015-01-21 DIAGNOSIS — R627 Adult failure to thrive: Secondary | ICD-10-CM | POA: Insufficient documentation

## 2015-01-21 DIAGNOSIS — E785 Hyperlipidemia, unspecified: Secondary | ICD-10-CM | POA: Diagnosis not present

## 2015-01-21 DIAGNOSIS — Z720 Tobacco use: Secondary | ICD-10-CM | POA: Diagnosis not present

## 2015-01-21 DIAGNOSIS — Z8505 Personal history of malignant neoplasm of liver: Secondary | ICD-10-CM | POA: Diagnosis not present

## 2015-01-21 DIAGNOSIS — Z7982 Long term (current) use of aspirin: Secondary | ICD-10-CM | POA: Diagnosis not present

## 2015-01-21 DIAGNOSIS — I10 Essential (primary) hypertension: Secondary | ICD-10-CM | POA: Diagnosis not present

## 2015-01-21 DIAGNOSIS — E663 Overweight: Secondary | ICD-10-CM | POA: Diagnosis not present

## 2015-01-21 DIAGNOSIS — E875 Hyperkalemia: Secondary | ICD-10-CM | POA: Diagnosis not present

## 2015-01-21 DIAGNOSIS — Z794 Long term (current) use of insulin: Secondary | ICD-10-CM | POA: Diagnosis not present

## 2015-01-21 DIAGNOSIS — R05 Cough: Secondary | ICD-10-CM | POA: Diagnosis not present

## 2015-01-21 LAB — CBC WITH DIFFERENTIAL/PLATELET
Basophils Absolute: 0 10*3/uL (ref 0.0–0.1)
Basophils Relative: 0 %
Eosinophils Absolute: 0 10*3/uL (ref 0.0–0.7)
Eosinophils Relative: 0 %
HCT: 37.1 % — ABNORMAL LOW (ref 39.0–52.0)
HEMOGLOBIN: 13.1 g/dL (ref 13.0–17.0)
LYMPHS ABS: 1.4 10*3/uL (ref 0.7–4.0)
LYMPHS PCT: 13 %
MCH: 28.7 pg (ref 26.0–34.0)
MCHC: 35.3 g/dL (ref 30.0–36.0)
MCV: 81.2 fL (ref 78.0–100.0)
Monocytes Absolute: 0.3 10*3/uL (ref 0.1–1.0)
Monocytes Relative: 3 %
NEUTROS ABS: 9.1 10*3/uL — AB (ref 1.7–7.7)
NEUTROS PCT: 84 %
Platelets: 338 10*3/uL (ref 150–400)
RBC: 4.57 MIL/uL (ref 4.22–5.81)
RDW: 20.3 % — ABNORMAL HIGH (ref 11.5–15.5)
WBC: 10.7 10*3/uL — AB (ref 4.0–10.5)

## 2015-01-21 LAB — BASIC METABOLIC PANEL
Anion gap: 9 (ref 5–15)
BUN: 8 mg/dL (ref 6–20)
CHLORIDE: 98 mmol/L — AB (ref 101–111)
CO2: 23 mmol/L (ref 22–32)
Calcium: 9.4 mg/dL (ref 8.9–10.3)
Creatinine, Ser: 0.36 mg/dL — ABNORMAL LOW (ref 0.61–1.24)
GFR calc non Af Amer: >60 mL/min (ref >60)
Glucose, Bld: 204 mg/dL — ABNORMAL HIGH (ref 65–99)
POTASSIUM: 6 mmol/L — AB (ref 3.5–5.1)
SODIUM: 130 mmol/L — AB (ref 135–145)

## 2015-01-21 LAB — I-STAT CHEM 8, ED
BUN: 5 mg/dL — ABNORMAL LOW (ref 6–20)
CALCIUM ION: 1.3 mmol/L — AB (ref 1.12–1.23)
CHLORIDE: 99 mmol/L — AB (ref 101–111)
Creatinine, Ser: 0.5 mg/dL — ABNORMAL LOW (ref 0.61–1.24)
Glucose, Bld: 173 mg/dL — ABNORMAL HIGH (ref 65–99)
HCT: 40 % (ref 39.0–52.0)
Hemoglobin: 13.6 g/dL (ref 13.0–17.0)
POTASSIUM: 5.7 mmol/L — AB (ref 3.5–5.1)
SODIUM: 131 mmol/L — AB (ref 135–145)
TCO2: 21 mmol/L (ref 0–100)

## 2015-01-21 LAB — CBG MONITORING, ED
GLUCOSE-CAPILLARY: 170 mg/dL — AB (ref 65–99)
Glucose-Capillary: 31 mg/dL — CL (ref 65–99)
Glucose-Capillary: 41 mg/dL — CL (ref 65–99)
Glucose-Capillary: 75 mg/dL (ref 65–99)

## 2015-01-21 MED ORDER — DEXTROSE 50 % IV SOLN
INTRAVENOUS | Status: AC
  Filled 2015-01-21: qty 50

## 2015-01-21 NOTE — ED Notes (Signed)
Pt given breakfast tray

## 2015-01-21 NOTE — ED Notes (Addendum)
Pt woke up this morning and CBG was 21 at home. Pt was able to drank cup of juice and sugar came up to 31. Pt from [facility]. Pt c/o dizziness. Pt alert and talking. Pt has some slight visible shaking.

## 2015-01-21 NOTE — Telephone Encounter (Signed)
Patient's drug Nexavar is not yet approved by insurance. Patient's brother called and asked about the prescription. I told him that it had not yet been approved but I didn't know the reason why. I told him that Angie handled that side of the drug issues. He said ok and said he would await a phone call from [facility]. I left Angie an in basket regarding this matter.

## 2015-01-21 NOTE — ED Notes (Signed)
CBG increase to 41 after 8 oz of orange juice. RN pushing another 8 oz orange juice at this time.

## 2015-01-21 NOTE — Discharge Instructions (Signed)
Hold all insulin until your diet improves and you see your doctor. Continue metformin. Discuss nursing home placement with your doctor.  If you were given medicines take as directed.  If you are on coumadin or contraceptives realize their levels and effectiveness is altered by many different medicines.  If you have any reaction (rash, tongues swelling, other) to the medicines stop taking and see a physician.    If your blood pressure was elevated in the ER make sure you follow up for management with a primary doctor or return for chest pain, shortness of breath or stroke symptoms.  Please follow up as directed and return to the ER or see a physician for new or worsening symptoms.  Thank you. Filed Vitals:   01/21/15 1245 01/21/15 1300 01/21/15 1315 01/21/15 1330  BP: 140/77 136/83 137/80 139/77  Pulse: 61 63 65 69  Resp: 10 11 13 19   Height:      Weight:      SpO2: 97% 97% 97% 99%

## 2015-01-21 NOTE — ED Provider Notes (Addendum)
CSN: [id number]     Arrival date & time 01/21/15  3151 History  By signing my name below, I, [name], attest that this documentation has been prepared under the direction and in the presence of Elnora Morrison, MD. Electronically Signed: [name], ED Scribe. 01/21/2015. 9:52 AM.   Chief Complaint  Patient presents with  . Hypoglycemia   The history is provided by the patient and a caregiver. No language interpreter was used.   PCP: Bronson Curb, PA-C HPI Comments: Nicholas Becker is a 59 y.o. 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 Pt woke up with CBG of 21 this morning and after ingesting a cup of juice CBG increased to 31. Caretaker from group home denies any recent DM medicine changes; however, reports pt has not been eating well since having his tooth pulled recently. Pt admits to mild cough at baseline but denies blood thinner use, abd pain, or any other Sx at this time.   Past Medical History  Diagnosis Date  . Diabetes mellitus   . Hypertension     normal Bmet in 2012  . Hyperlipemia   . Chronic pancreatitis     calcified  . Tobacco abuse     1/2 pack per day  . Cholelithiasis 2009  . Bronchiectasis 2009    Left lower lobe; normal chest x-ray in 2012  . Cataracts, bilateral   . Overweight(278.02)   . 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 Nephrolithiasis 12/31/2010  . Substance abuse     Cocaine, marijuana, alcohol, stopped 10 yrs ago  . Pancreatitis   . GERD (gastroesophageal reflux disease)   . Chronic hepatitis C with cirrhosis 2006    Secondary to hepatitis C and ethanol abuse, which is now in remission; varices-status post banding at [facility]; normal LFTs in 2009; thrombocytopenia In the past, platelets-153,000 in 2012  . Cirrhosis    . Esophageal varices    Past Surgical History  Procedure Laterality Date  . Percutaneous liver biopsy  2006  . Multiple extractions with alveoloplasty N/A 10/11/2014    Procedure: MULTIPLE EXTRACTIONS: Teeth # 1, 4, 6, 9, 10, 11, 12, 20, 28,  WITH ALVEOLOPLASTY AND REMOVAL OF BILATREAL MANDIBULAR TORI;  Surgeon: Diona Browner, DDS;  Location: [facility];  Service: Oral Surgery;  Laterality: N/A;   Family History  Problem Relation Age of Onset  . Healthy Sister   . Lung cancer Brother   . Healthy Sister   . Healthy Sister   . Healthy Sister   . Healthy Brother   . Healthy Brother   . Diabetes Daughter   . Healthy Son    Social History  Substance Use Topics  . Smoking status: Current Every Day Smoker -- 0.50 packs/day for 30 years    Types: Cigarettes  . Smokeless tobacco: Never Used     Comment: Patient smokes 1 pack a day  . Alcohol Use: No    Review of Systems  Constitutional: Negative for fever and appetite change (decreased intake PO).  Respiratory: Positive for cough (mild at baseline).   Hematological: Does not bruise/bleed easily.  Psychiatric/Behavioral: Positive for confusion.   Allergies  Review of patient's allergies indicates no known allergies.  Home Medications   Prior to Admission medications   Medication Sig Start Date End  Date Taking? Authorizing Provider  amLODipine (NORVASC) 5 MG tablet Take 5 mg by mouth every morning.     Historical Provider, MD  aspirin 81 MG chewable tablet Chew 81 mg by mouth every morning.    Historical Provider, MD  Cholecalciferol (VITAMIN D) 2000 UNITS tablet Take 2,000 Units by mouth every morning.     Historical Provider, MD  citalopram (CELEXA) 40 MG tablet Take 40 mg by mouth every morning.     Historical Provider, MD  ibuprofen (ADVIL,MOTRIN) 800 MG tablet Take 800 mg by mouth 3 (three) times daily as needed. 01/07/15   Historical Provider, MD  insulin detemir (LEVEMIR) 100 UNIT/ML injection Inject 41 Units into the skin every  morning.     Historical Provider, MD  insulin lispro (HUMALOG) 100 UNIT/ML injection Inject 5 Units into the skin 3 (three) times daily before meals.    Historical Provider, MD  lisinopril (PRINIVIL,ZESTRIL) 40 MG tablet Take 1 tablet (40 mg total) by mouth daily. 12/10/14   Kathie Dike, MD  metFORMIN (GLUCOPHAGE) 1000 MG tablet Take 1,000 mg by mouth 2 (two) times daily with a meal.     Historical Provider, MD  Multiple Vitamin (MULTIVITAMIN) tablet Take 1 tablet by mouth every morning.     Historical Provider, MD  nadolol (CORGARD) 80 MG tablet Take 80 mg by mouth every morning.     Historical Provider, MD  nystatin (MYCOSTATIN) 100000 UNIT/ML suspension Take 5 mLs (500,000 Units total) by mouth 4 (four) times daily. 01/10/15   Patrici Ranks, MD  Omega-3 Fatty Acids (FISH OIL) 1000 MG CAPS Take 1,000 mg by mouth every morning.     Historical Provider, MD  omeprazole (PRILOSEC) 20 MG capsule Take 20 mg by mouth every morning.     Historical Provider, MD  simvastatin (ZOCOR) 10 MG tablet Take 10 mg by mouth at bedtime.     Historical Provider, MD  SORAfenib (NEXAVAR) 200 MG tablet Take 1 tablet (200 mg total) by mouth 2 (two) times daily. Give on an empty stomach 1 hour before or 2 hours after meals. 01/14/15   Baird Cancer, PA-C  traZODone (DESYREL) 150 MG tablet Take 75 mg by mouth at bedtime.     Historical Provider, MD   Triage Vitals: BP 134/96 mmHg  Pulse 54  Resp 16  Ht 5\' 4"  (1.626 m)  Wt 126 lb (57.153 kg)  BMI 21.62 kg/m2  SpO2 98% Physical Exam  Constitutional: He is oriented to person, place, and time. He appears well-developed and well-nourished.  HENT:  Head: Normocephalic.  Eyes: EOM are normal.  Mild jaundice bilateral eyes.    Neck: Normal range of motion.  Cardiovascular: Normal rate and regular rhythm.   Pulmonary/Chest: Effort normal. No respiratory distress.  Abdominal: He exhibits no distension.  Musculoskeletal: Normal range of motion. He exhibits no  edema (No swelling of BLE.).  Neurological: He is alert and oriented to person, place, and time.  No facial droop. Very mild general confusion.   Nursing note and vitals reviewed.  ED Course  Procedures (including critical care time) Emergency Ultrasound Study:   Angiocath insertion Performed by: Mariea Clonts  Consent: Verbal consent obtained. Risks and benefits: risks, benefits and alternatives were discussed Immediately prior to procedure the correct patient, procedure, equipment, support staff and site/side marked as needed.  Indication: difficult IV access Preparation: Patient was prepped and draped in the usual sterile fashion. Vein Location: right ac 20 g vein was visualized during assessment for  potential access sites and was found to be patent/ easily compressed with linear ultrasound.  The needle was visualized with real-time ultrasound and guided into the vein. Gauge: right ac  Image saved and stored.  Normal blood return.  Patient tolerance: Patient tolerated the procedure well with no immediate complications.    Emergency Ultrasound Study:   Angiocath insertion Performed by: Mariea Clonts  Consent: Verbal consent obtained. Risks and benefits: risks, benefits and alternatives were discussed Immediately prior to procedure the correct patient, procedure, equipment, support staff and site/side marked as needed.  Indication: difficult IV access Preparation: Patient was prepped and draped in the usual sterile fashion. Vein Location: right ac vein was visualized during assessment for potential access sites and was found to be patent/ easily compressed with linear ultrasound.  The needle was visualized with real-time ultrasound and guided into the vein. Gauge: 20 Image saved and stored.  Normal blood return.  Patient tolerance: Patient tolerated the procedure well with no immediate complications.     DIAGNOSTIC STUDIES: Oxygen Saturation is 98% on RA, nl by my  interpretation.    COORDINATION OF CARE: 9:52 AM: Discussed treatment plan with pt and caregiver at bedside; they verbalize understanding and agree with treatment plan.  Labs Review Labs Reviewed  CBG MONITORING, ED - Abnormal; Notable for the following:    Glucose-Capillary 31 (*)    All other components within normal limits  CBG MONITORING, ED - Abnormal; Notable for the following:    Glucose-Capillary 41 (*)    All other components within normal limits  BASIC METABOLIC PANEL  CBC WITH DIFFERENTIAL/PLATELET   I have personally reviewed and evaluated these lab results as part of my medical decision-making.  MDM   Final diagnoses:  None   Patient presents with decreased appetite for the past few weeks and gradually worsening failure to thrive. Patient had low glucose this morning. Patient has been taking his normal insulin dosing despite not eating. Patient has a primary doctor. Patient did tolerate mild by mouth intake. Difficulty with IV multiple ultrasound guided IVs needed. Hemolysis and mild hyperkalemia without EKG changes. Patient will need close outpatient follow-up and possible nursing home placement.  Results and differential diagnosis were discussed with the patient/parent/guardian. Xrays were independently reviewed by myself.  Close follow up outpatient was discussed, comfortable with the plan.   Medications  dextrose 50 % solution (not administered)    Filed Vitals:   01/21/15 1245 01/21/15 1300 01/21/15 1315 01/21/15 1330  BP: 140/77 136/83 137/80 139/77  Pulse: 61 63 65 69  Resp: 10 11 13 19   Height:      Weight:      SpO2: 97% 97% 97% 99%    Final diagnoses:  Failure to thrive in adult  Hypoglycemia  Hyperkalemia      Elnora Morrison, MD 01/21/15 1343  Elnora Morrison, MD 01/21/15 1343

## 2015-01-21 NOTE — ED Notes (Signed)
Multiple attempts to insert PIV by nurses. Dr. Reather Converse notified. Ultrasound placed at beside for IV attempt by Dr. Reather Converse.

## 2015-01-27 ENCOUNTER — Encounter (HOSPITAL_COMMUNITY): Payer: Self-pay | Admitting: Hematology & Oncology

## 2015-01-27 ENCOUNTER — Ambulatory Visit (HOSPITAL_COMMUNITY)
Admission: RE | Admit: 2015-01-27 | Discharge: 2015-01-27 | Disposition: A | Discharge status: Home / Self Care | Payer: Medicare Other | Source: Ambulatory Visit | Attending: Hematology & Oncology | Admitting: Hematology & Oncology

## 2015-01-27 ENCOUNTER — Encounter (HOSPITAL_BASED_OUTPATIENT_CLINIC_OR_DEPARTMENT_OTHER): Payer: Medicare Other

## 2015-01-27 ENCOUNTER — Encounter (HOSPITAL_COMMUNITY): Payer: Medicare Other | Attending: Hematology & Oncology | Admitting: Hematology & Oncology

## 2015-01-27 VITALS — BP 141/72 | HR 66 | Resp 18 | Wt 125.2 lb

## 2015-01-27 DIAGNOSIS — F1021 Alcohol dependence, in remission: Secondary | ICD-10-CM

## 2015-01-27 DIAGNOSIS — Z23 Encounter for immunization: Secondary | ICD-10-CM | POA: Diagnosis not present

## 2015-01-27 DIAGNOSIS — M545 Low back pain: Secondary | ICD-10-CM

## 2015-01-27 DIAGNOSIS — C220 Liver cell carcinoma: Secondary | ICD-10-CM | POA: Diagnosis present

## 2015-01-27 DIAGNOSIS — R188 Other ascites: Secondary | ICD-10-CM | POA: Diagnosis not present

## 2015-01-27 DIAGNOSIS — K7460 Unspecified cirrhosis of liver: Secondary | ICD-10-CM

## 2015-01-27 DIAGNOSIS — B1920 Unspecified viral hepatitis C without hepatic coma: Secondary | ICD-10-CM | POA: Diagnosis not present

## 2015-01-27 DIAGNOSIS — I81 Portal vein thrombosis: Secondary | ICD-10-CM

## 2015-01-27 DIAGNOSIS — R05 Cough: Secondary | ICD-10-CM | POA: Diagnosis not present

## 2015-01-27 LAB — CBC WITH DIFFERENTIAL/PLATELET
BASOS ABS: 0 10*3/uL (ref 0.0–0.1)
Basophils Relative: 0 %
EOS PCT: 1 %
Eosinophils Absolute: 0.1 10*3/uL (ref 0.0–0.7)
HCT: 34.4 % — ABNORMAL LOW (ref 39.0–52.0)
Hemoglobin: 11.9 g/dL — ABNORMAL LOW (ref 13.0–17.0)
LYMPHS PCT: 14 %
Lymphs Abs: 1.6 10*3/uL (ref 0.7–4.0)
MCH: 28.8 pg (ref 26.0–34.0)
MCHC: 34.6 g/dL (ref 30.0–36.0)
MCV: 83.3 fL (ref 78.0–100.0)
Monocytes Absolute: 0.9 10*3/uL (ref 0.1–1.0)
Monocytes Relative: 8 %
NEUTROS ABS: 8.4 10*3/uL — AB (ref 1.7–7.7)
Neutrophils Relative %: 76 %
PLATELETS: 180 10*3/uL (ref 150–400)
RBC: 4.13 MIL/uL — AB (ref 4.22–5.81)
RDW: 21.8 % — ABNORMAL HIGH (ref 11.5–15.5)
WBC: 11.1 10*3/uL — AB (ref 4.0–10.5)

## 2015-01-27 LAB — COMPREHENSIVE METABOLIC PANEL
ALT: 67 U/L — AB (ref 17–63)
AST: 161 U/L — AB (ref 15–41)
Albumin: 2.2 g/dL — ABNORMAL LOW (ref 3.5–5.0)
Alkaline Phosphatase: 404 U/L — ABNORMAL HIGH (ref 38–126)
Anion gap: 8 (ref 5–15)
BUN: 7 mg/dL (ref 6–20)
CHLORIDE: 102 mmol/L (ref 101–111)
CO2: 20 mmol/L — AB (ref 22–32)
CREATININE: 0.37 mg/dL — AB (ref 0.61–1.24)
Calcium: 9.5 mg/dL (ref 8.9–10.3)
GFR calc Af Amer: >60 mL/min (ref >60)
Glucose, Bld: 183 mg/dL — ABNORMAL HIGH (ref 65–99)
Potassium: 4.7 mmol/L (ref 3.5–5.1)
Sodium: 130 mmol/L — ABNORMAL LOW (ref 135–145)
Total Bilirubin: 5.7 mg/dL — ABNORMAL HIGH (ref 0.3–1.2)
Total Protein: 6.9 g/dL (ref 6.5–8.1)

## 2015-01-27 MED ORDER — INFLUENZA VAC SPLIT QUAD 0.5 ML IM SUSY
0.5000 mL | PREFILLED_SYRINGE | Freq: Once | INTRAMUSCULAR | Status: AC
  Administered 2015-01-27: 0.5 mL via INTRAMUSCULAR
  Filled 2015-01-27: qty 0.5

## 2015-01-27 MED ORDER — AZITHROMYCIN 250 MG PO TABS: ORAL_TABLET | ORAL | Status: AC

## 2015-01-27 MED ORDER — OXYCODONE HCL 5 MG PO TABS: 5.0000 mg | ORAL_TABLET | ORAL | Status: AC | PRN

## 2015-01-27 NOTE — Patient Instructions (Signed)
..  [place] at [facility] Discharge Instructions  RECOMMENDATIONS MADE BY THE CONSULTANT AND ANY TEST RESULTS WILL BE SENT TO YOUR REFERRING PHYSICIAN.  Exam today, lab work and we will send you downstairs for a paracentesis  Return in 1 week to see Nicholas Becker  Thank you for choosing [place] at [facility] to provide your oncology and hematology care.  To afford each patient quality time with our provider, please arrive at least 15 minutes before your scheduled appointment time.    You need to re-schedule your appointment should you arrive 10 or more minutes late.  We strive to give you quality time with our providers, and arriving late affects you and other patients whose appointments are after yours.  Also, if you no show three or more times for appointments you may be dismissed from the clinic at the providers discretion.     Again, thank you for choosing [facility]  Our hope is that these requests will decrease the amount of time that you wait before being seen by our physicians.       _____________________________________________________________  Should you have questions after your visit to [facility], please contact our office at [phone] between the hours of 8:30 a.m. and 4:30 p.m.  Voicemails left after 4:30 p.m. will not be returned until the following business day.  For prescription refill requests, have your pharmacy contact our office.

## 2015-01-27 NOTE — Progress Notes (Signed)
..  Nicholas Becker presents today for injection per the provider's orders.  Flu vaccine administration without incident; see MAR for injection details.  Patient tolerated procedure well and without incident.  No questions or complaints noted at this time.

## 2015-01-27 NOTE — Procedures (Signed)
PreOperative Dx: HCC, ascites Postoperative Dx: HCC, ascites Procedure:   US guided paracentesis Radiologist:  Thornton Papas Anesthesia:  10 ml of 1% lidocaine Specimen:  2500 ml of amber colored ascitic fluid EBL:   < 1 ml Complications: None

## 2015-01-27 NOTE — Progress Notes (Signed)
Lab draw

## 2015-01-27 NOTE — Progress Notes (Signed)
Paracentesis complete no signs of distress. 2500 ml amber colored ascites removed .

## 2015-01-27 NOTE — Progress Notes (Signed)
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 male is here on referral from Dr. Laural Golden because of his hepatocellular carcinoma. Nicholas Becker is here today with four women, including his caregiver, mother, sister-in-law, and sister.  Nicholas Becker is here today with his male caregiver. His caregiver remarks that he is not eating that much, and he confirms this. He also agrees that he feels like fluid may need to be pulled from his belly.  His caregiver says he complains of a lot of pain with his back, and that lately, he has a cough. He reports that his cough sometimes keeps him up at night.   He is currently taking ibuprofen at 800 mg for pain. He says he is open to a pain pill.  Nicholas Becker also states that lately he feels a bit "off-balance," like he's going to fall over sometimes if he gets up too quick.  His nexavar has been approved. He has not received it yet.   MEDICAL HISTORY:  Past Medical History  Diagnosis  Date  . Diabetes mellitus   . Hypertension     normal Bmet in 2012  . Hyperlipemia   . Chronic pancreatitis ([place])     calcified  . Tobacco abuse     1/2 pack per day  . Cholelithiasis 2009  . Bronchiectasis 2009    Left lower lobe; normal chest x-ray in 2012  . Cataracts, bilateral   . Overweight(278.02)   . 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 Nephrolithiasis 12/31/2010  . Substance abuse     Cocaine, marijuana, alcohol, stopped 10 yrs ago  . Pancreatitis   . GERD (gastroesophageal reflux disease)   . Chronic hepatitis C with cirrhosis ([place]) 2006    Secondary to hepatitis C and ethanol abuse, which is now in remission; varices-status post banding at [facility]; normal LFTs in 2009; thrombocytopenia In the past, platelets-153,000 in 2012  . Cirrhosis ([place])   . Esophageal varices ([place])     SURGICAL HISTORY: Past Surgical History  Procedure Laterality Date  . Percutaneous liver biopsy  2006  . Multiple extractions with alveoloplasty N/A 10/11/2014    Procedure: MULTIPLE EXTRACTIONS: Teeth # 1, 4, 6, 9, 10, 11, 12, 20, 28,  WITH ALVEOLOPLASTY AND REMOVAL OF BILATREAL MANDIBULAR TORI;  Surgeon: Diona Browner, DDS;  Location: [facility];  Service: Oral Surgery;  Laterality: N/A;    SOCIAL HISTORY: Social History   Social History  . Marital Status: Divorced    Spouse Name: N/A  . Number of Children: N/A  . Years of Education: N/A   Occupational History  . Not on file.   Social History Main  Topics  . Smoking status: Current Every Day Smoker -- 0.50 packs/day for 30 years    Types: Cigarettes  . Smokeless tobacco: Never Used     Comment: Patient smokes 1 pack a day  . Alcohol Use: No  . Drug Use: No     Comment: quit coaine alcohol and marijuana 10 years ago  . Sexual Activity: No   Other Topics Concern  . Not on file   Social History Narrative   Two children; no grandchildren Born in [place] Worked for a [employer]   His hepatitis was  cured  FAMILY HISTORY: Family History  Problem Relation Age of Onset  . Healthy Sister   . Lung cancer Brother   . Healthy Sister   . Healthy Sister   . Healthy Sister   . Healthy Brother   . Healthy Brother   . Diabetes Daughter   . Healthy Son    indicated that his mother is alive. He indicated that his father is deceased. He indicated that all of his four sisters are alive. He indicated that two of his three brothers are alive. He indicated that his daughter is alive. He indicated that his son is alive.   Mother is 14, soon to be 26 Father deceased in a car accident Four sisters, three brothers; one brother deceased from liver cancer (had alcohol problems)  ALLERGIES:  has No Known Allergies.  MEDICATIONS:  Current Outpatient Prescriptions  Medication Sig Dispense Refill  . amLODipine (NORVASC) 5 MG tablet Take 5 mg by mouth every morning.     Marland Kitchen aspirin 81 MG chewable tablet Chew 81 mg by mouth every morning.    . Cholecalciferol (VITAMIN D) 2000 UNITS tablet Take 2,000 Units by mouth every morning.     . citalopram (CELEXA) 40 MG tablet Take 40 mg by mouth every morning.     Marland Kitchen ibuprofen (ADVIL,MOTRIN) 800 MG tablet Take 800 mg by mouth 3 (three) times daily as needed.    . insulin detemir (LEVEMIR) 100 UNIT/ML injection Inject 35 Units into the skin every morning.     . insulin lispro (HUMALOG) 100 UNIT/ML injection Inject 5 Units into the skin 3 (three) times daily before meals.    Marland Kitchen lisinopril (PRINIVIL,ZESTRIL) 40 MG tablet Take 1 tablet (40 mg total) by mouth daily. 30 tablet 1  . metFORMIN (GLUCOPHAGE) 1000 MG tablet Take 1,000 mg by mouth 2 (two) times daily with a meal.     . Multiple Vitamin (MULTIVITAMIN) tablet Take 1 tablet by mouth every morning.     . nadolol (CORGARD) 80 MG tablet Take 80 mg by mouth every morning.     . Omega-3 Fatty Acids (FISH OIL) 1000 MG CAPS Take 1,000 mg by mouth every morning.     Marland Kitchen omeprazole (PRILOSEC) 20 MG capsule Take 20 mg by  mouth every morning.     . simvastatin (ZOCOR) 10 MG tablet Take 10 mg by mouth at bedtime.     . traZODone (DESYREL) 150 MG tablet Take 75 mg by mouth at bedtime.     Marland Kitchen azithromycin (ZITHROMAX Z-PAK) 250 MG tablet Two tablets on day 1 and one tablet daily thereafter 6 each 0  . HYDROcodone-acetaminophen (NORCO/VICODIN) 5-325 MG per tablet Take 1 tablet by mouth every 6 (six) hours as needed for moderate pain.    Marland Kitchen nystatin (MYCOSTATIN) 100000 UNIT/ML suspension Take 5 mLs (500,000 Units total) by mouth 4 (four) times daily. (Patient not taking: Reported on 01/21/2015)  60 mL 0  . oxyCODONE (OXY IR/ROXICODONE) 5 MG immediate release tablet Take 1 tablet (5 mg total) by mouth every 4 (four) hours as needed for severe pain. 60 tablet 0  . SORAfenib (NEXAVAR) 200 MG tablet Take 1 tablet (200 mg total) by mouth 2 (two) times daily. Give on an empty stomach 1 hour before or 2 hours after meals. (Patient not taking: Reported on 01/21/2015) 60 tablet 0   No current facility-administered medications for this visit.    Review of Systems  Constitutional: Positive for weight loss and malaise/fatigue.  HENT: Negative.        Has a lesion in his jaw that he is getting biopsied soon.  Eyes: Negative.   Respiratory: Negative.   Cardiovascular: Negative.   Gastrointestinal: Positive for abdominal pain. Negative for nausea and vomiting.       Swollen belly that he noticed recently. Family remarks that it's been swollen for about a month.  Genitourinary: Positive for frequency.  Musculoskeletal: Positive for back pain and joint pain.  Skin: Negative.   Neurological: Positive for weakness. Negative for dizziness, tingling, tremors, seizures and loss of consciousness.  Endo/Heme/Allergies: Negative.   Psychiatric/Behavioral: Negative.   All other systems reviewed and are negative.  14 point ROS was done and is otherwise as detailed above or in HPI   PHYSICAL EXAMINATION: ECOG PERFORMANCE STATUS: 2 -  Symptomatic, <50% confined to bed  Filed Vitals:   01/27/15 1001  BP: 141/72  Pulse: 66  Resp: 18   Filed Weights   01/27/15 1001  Weight: 125 lb 3.2 oz (56.79 kg)     Physical Exam  Gait is slow, ambulated into the clinic on his own today Constitutional: He is oriented to person, place, and time. No distress.  Thin, appears chronically ill  HENT:  Head: Normocephalic and atraumatic.  Nose: Nose normal.  Mouth/Throat: Oropharynx is clear and moist. No oropharyngeal exudate.  Eyes: Conjunctivae and EOM are normal. Pupils are equal, round, and reactive to light. Right eye exhibits no discharge. Left eye exhibits no discharge. Scleral icterus observed Neck: Normal range of motion. Neck supple. No tracheal deviation present. No thyromegaly present.  Cardiovascular: Normal rate, regular rhythm and normal heart sounds.  Exam reveals no gallop and no friction rub.   No murmur heard. Pulmonary/Chest: Effort normal. No respiratory distress. He has no wheezes. He has no rales.   Coarse rhonchi; has some bronchitis in both lungs Abdominal: Soft. Bowel sounds are normal. He exhibits distension. He exhibits no mass. There is no tenderness. There is no rebound and no guarding.  Ascites; swollen, tight abdomen  Musculoskeletal: Normal range of motion. He exhibits no tenderness.  Lymphadenopathy:    He has no cervical adenopathy.  Neurological: He is alert and oriented to person, place, and time. He has normal reflexes. No cranial nerve deficit. Coordination normal.  Skin: Skin is warm and dry. No rash noted. He is not diaphoretic.  Psychiatric: Mood and affect normal.  Nursing note and vitals reviewed.   LABORATORY DATA:  I have reviewed the data as listed  CBC    Component Value Date/Time   WBC 11.1* 01/27/2015 1130   RBC 4.13* 01/27/2015 1130   HGB 11.9* 01/27/2015 1130   HCT 34.4* 01/27/2015 1130   PLT 180 01/27/2015 1130   MCV 83.3 01/27/2015 1130   MCH 28.8 01/27/2015 1130    MCHC 34.6 01/27/2015 1130   RDW 21.8* 01/27/2015 1130   LYMPHSABS 1.6 01/27/2015 1130  MONOABS 0.9 01/27/2015 1130   EOSABS 0.1 01/27/2015 1130   BASOSABS 0.0 01/27/2015 1130   CMP     Component Value Date/Time   NA 130* 01/27/2015 1130   K 4.7 01/27/2015 1130   CL 102 01/27/2015 1130   CO2 20* 01/27/2015 1130   GLUCOSE 183* 01/27/2015 1130   BUN 7 01/27/2015 1130   CREATININE 0.37* 01/27/2015 1130   CREATININE 0.90 08/28/2013 1150   CALCIUM 9.5 01/27/2015 1130   CALCIUM REFERT 12/09/2014 1746   PROT 6.9 01/27/2015 1130   ALBUMIN 2.2* 01/27/2015 1130   AST 161* 01/27/2015 1130   ALT 67* 01/27/2015 1130   ALKPHOS 404* 01/27/2015 1130   BILITOT 5.7* 01/27/2015 1130   GFRNONAA >60 01/27/2015 1130   GFRAA >60 01/27/2015 1130    RADIOGRAPHIC STUDIES:  EXAM: MRI abdomen with and without contrast DATE: 12/25/14 18:35:00 ACCESSION: [id number] UN DICTATED: 12/26/14 08:05:53 INTERPRETATION LOCATION: [place]  CLINICAL INDICATION: 59 Year Old (M): Hepatitis C and alcohol cirrhosis. History of hepatocellular carcinoma status post segment 4/5 ablation and segment 2 TACE.   COMPARISON: Outside MRI abdomen 08/20/2014.  TECHNIQUE: MRI of the abdomen was obtained with and without IV contrast. Multisequence, multiplanar images were obtained.      FINDINGS: Lobulated T1 hyperintense/T2 hypointense lesion in the left lower lobe and along the left hemidiaphragm, similar to prior. No pleural effusion.  Nodular liver contour with caudate and left hepatic lobe hypertrophy, consistent with cirrhosis. Nonenhancing T1/T2 hypointense lesion in segment IV, likely sequela of prior ablation. Ill-defined, diffuse slightly increased T2 signal throughout the left hepatic lobe with invasion and expansion of the right and left portal veins. Invasion of the right portal vein is new compared to prior. The right hepatic vein is patent. The left hepatic vein is again compressed.   Gallstones in an  otherwise normal gallbladder. There is no intra- or extrahepatic biliary ductal dilatation. The spleen is normal in appearance and signal intensity.  The pancreas is normal in appearance and signal intensity.  The adrenal glands are normal in appearance and signal intensity. Unchanged tiny left renal cysts. The right kidney is unremarkable.  The aorta, IVC, and mesenteric vessels are patent. The splenic vein and SMV are patent.   There is diffuse gastric wall thickening, new compared to prior. There is no evidence of bowel obstruction.  There is no retroperitoneal adenopathy.  Moderate volume ascites.   There are no soft tissue lesions. Bone marrow signal intensity is within normal limits.  IMPRESSION:  -- Progression of diffuse HCC in the left hepatic lobe. Tumor thrombus in the right and left portal veins, increased compared to prior. -- Cirrhosis. New moderate ascites. -- Diffuse gastric wall thickening. -- Proteinaceous/hemorrhagic localized pleural collection, unchanged from prior. As suggested may be the sequela of prior trauma.  ASSESSMENT & PLAN:  Hepatocellular carcinoma, macrovascular invasion Tumor thrombus in right and left portal veins Cirrhosis, moderate ascites History of chronic pancreatitis Hepatitis C having received pegylated interferon and ribavirin History of alcohol abuse Marginal performance status Hypercalcemia of malignancy  He walked into the clinic today. He is still ambulatory. His appetite is poor. He has been approved for Nexavar unfortunately his bilirubin has more than doubled since I last saw him. I would question at this point his ability to tolerate the therapy. We will discuss this with him.  I set him up for a paracentesis today as he is quite uncomfortable.  I recommend that the patient consume several small meals a day. I  will prescribe him Oxycodone for pain, as well as the antibiotic Zithromax.  He will receive the flu shot today.    His Nexavar has been approved; we don't know when it gets delivered.  We've discussed hospice in the past. If he doesn't tolerate Nexavar, the plan is for hospice.  Orders Placed This Encounter  Procedures  . US Paracentesis    Standing Status: Future     Number of Occurrences: 1     Standing Expiration Date: 01/27/2016    Order Specific Question:  If therapeutic, is there a maximum amount of fluid to be removed?    Answer:  No    Order Specific Question:  Are labs required for specimen collection?    Answer:  No    Order Specific Question:  Reason for Exam (SYMPTOM  OR DIAGNOSIS REQUIRED)    Answer:  Bell,, ascities, abdominal distention and pain    Order Specific Question:  Preferred imaging location?    Answer:  [facility]  . CBC with Differential  . Comprehensive metabolic panel    All questions were answered. The patient knows to call the clinic with any problems, questions or concerns. We can certainly see the patient much sooner if necessary.  This document serves as a record of services personally performed by Ancil Linsey, MD. It was created on her behalf by Toni Amend, a trained medical scribe. The creation of this record is based on the scribe's personal observations and the provider's statements to them. This document has been checked and approved by the attending provider.  I have reviewed the above documentation for accuracy and completeness, and I agree with the above.  This note was electronically signed.    Molli Hazard, MD  01/27/2015 1:34 PM

## 2015-01-28 ENCOUNTER — Other Ambulatory Visit (HOSPITAL_COMMUNITY): Payer: Self-pay | Admitting: Oncology

## 2015-01-28 DIAGNOSIS — C220 Liver cell carcinoma: Secondary | ICD-10-CM

## 2015-01-28 MED ORDER — ONDANSETRON HCL 8 MG PO TABS: 8.0000 mg | ORAL_TABLET | Freq: Three times a day (TID) | ORAL | Status: AC | PRN

## 2015-01-28 MED ORDER — SORAFENIB TOSYLATE 200 MG PO TABS: 200.0000 mg | ORAL_TABLET | Freq: Two times a day (BID) | ORAL | Status: AC

## 2015-01-28 NOTE — Patient Instructions (Signed)
Nicholas Becker   CHEMOTHERAPY INSTRUCTIONS  The most common side effects with NEXAVAR include:   Diarrhea (frequent or loose bowel movements) Tiredness Infection Hair thinning or patchy hair loss Rash Weight loss Loss of appetite Nausea Stomach (abdominal) pain  How to manage side effects:  High Blood Pressure--  This is a common side effect when you take NEXAVAR and it usually happens soon after your treatment. Your blood pressure should be checked every week during the first 6 weeks of taking NEXAVAR and then regularly after that  If your blood pressure is high, it should be treated. High blood pressure can only be treated with medicine from a doctor's prescription  If the high blood pressure does not get better, even with the medicine, you might have to stop taking NEXAVAR, but this is something your doctor will tell you   Diarrhea --  Loose bowel movements may not sound serious, but they can lead to a loss of water in your body and weight loss. Your doctor can give you medicine for diarrhea, but you must first tell your doctor about the problem. Below are some tips that can help you with loose bowel movements.  Things you CAN do (make sure you ask your doctor first): Eat a number of small meals a day and ask your doctor about foods that are good for you  Drink a lot of water or clear liquids, but not anything very hot or cold, and drink at least 1 glass after each loose bowel movement  Take whatever medicine your doctor prescribes to help with diarrhea  Things you should NOT do: Do not eat dairy products like milk and ice cream, high fat foods like red meat and cheese, and high-fiber foods like broccoli and fiber cereals  Do not eat spicy foods or foods that give you gas, like beans  Do not drink alcohol, coffee, tea, or sodas with caffeine (like colas)  Do not drink prune juice or orange juice  Do not chew sugar-free gum or candy (made with  sorbitol)  Rash --  Below are some tips that can help you manage rashes.  Things you CAN do (make sure you ask your doctor first): Use a moisturizer with no perfume or added ingredients after you take a shower or bath  Use anti-itch products  Use a mild soap  If you have to be outside, use a sunblock with at least 30 SPF  Wear loose clothes  Wear a hat with a brim to block the sun  Things you should NOT do: Do not use hot water or take hot showers  Do not sit in the sun   Feeling Tired--  Using NEXAVAR can make you feel more tired than normal. This tired feeling will not always go away, even with a lot of rest and sleep. Tell your doctor about feeling tired. Your doctor will try to figure out what is making you tired. It could be NEXAVAR. It could be the cancer. It could be what you are eating. It could be not getting enough sleep. Below are some tips that can help you if you are feeling tired.  Things you CAN do (make sure you ask your doctor first): Do things that need more energy in the morning, or at times when you feel you have more energy  Keep busy with things that relax you, like games, music, reading, or visiting friends  Keep moving during the day  Exercise as much  as you can, but ask your doctor if it is okay and what kind of exercise you should try  Things you should NOT do: Do not try to do everything. Decide what is most important  Hand Foot Skin Reaction (HFSR) --  HFSR is a skin problem that causes redness, pain, swelling, or blisters on the palms of your hands and soles of your feet. Most of the time it happens in the first 6 weeks of treatment.  Signs & Symptoms of HFSR: A numb feeling or "pins-and-needles", Sensitivity to warm or hot objects (like a coffee mug), Tenderness or soreness, Burning feeling, Redness, Swelling, Buildup of hard skin, Dry or cracked skin or blisters, Peeling and/or flaking skin.  We need to know if any of these things develop!!!!    There are ways to manage HFSR, if you get it.  Things that you CAN do (make sure you ask your doctor first): Use creams and/or lotions on your hands and/or feet. Use a lot of cream and/or lotion daily or leave it on overnight while you sleep. Cover with gloves or socks to make sure it stays on  See a skin or foot doctor about calluses or other problems with your feet  Use cotton socks, shoe pads, or soft shoes  Soak skin in an Epsom salt bath with warm water  Use ice packs on areas of skin that hurt, and keep it there for about 20 minutes (do not put ice directly on bare skin)  Lift your hands or feet up above the rest of your body when sitting or lying down  Wear loose clothes and shoes  Things you should NOT do: Do not do things that rub your feet, like jogging, aerobics, or long walks  Do not use anything that rubs your hands, like tools that need pressure to work  Do not rub your skin with a towel after a shower or bath. It can make your skin problems worse  Do not stand for a long time  Do not keep your hands or feet in hot water for a long time, for example, while washing dishes or bathing  Do not use rubber gloves with hot water because the rubber can make your skin feel hotter  Do not touch strong cleaners (soaps) that you might use around the house  Do not sit in a hot place or in the sun      NEXAVAR may interact with certain other medicines and cause serious side effects so tell your doctor about all medicines you take including prescription and over-the-counter (OTC) medicines, vitamins, or herbal supplements. Especially tell your doctor if you are taking the following medicines: warfarin (Coumadin, Jantoven), neomycin, St. Johns Wort, dexamethasone, phenytoin (Fosphenytoin sodium, Dilantin, Phenytek), carbamazepine (Carbatrol, Equetro, Tegretol, Teril, Epitol), rifampin (Rifater, Rifamate, Rifadin, Rimactane), rifabutin (Mycobutin), phenobarbital. NEXAVAR may cause serious  side effects, including:  decreased blood flow to the heart and heart attack. Get emergency help right away and call your doctor if you have chest pain, shortness of breath, feel lightheaded or faint, have nausea or vomiting, or you are sweating a lot.   bleeding problems. Bleeding is a common side effect of NEXAVAR that can be serious and sometimes lead to death. Tell your doctor if you have any bleeding or easy bruising while taking NEXAVAR.   high blood pressure. High blood pressure is a common side effect of NEXAVAR and can be serious. Your blood pressure should be checked every week during the first 6  weeks of starting therapy and then regularly, thereafter. If your blood pressure is high, it should be treated.   a skin problem called hand-foot skin reaction. This causes redness, pain, swelling, or blisters on the palms of your hands and soles of your feet. Your doctor may change your dose or stop treatment for a while.   serious skin and mouth reactions. NEXAVAR can cause serious skin and mouth reactions which can be life-threatening. Tell your doctor if you have skin rash, blistering and peeling of the skin, blistering and peeling on the inside of your mouth.   an opening in the wall of your stomach or intestines (perforation of the bowel). Tell your doctor right away if you get high fever, nausea, vomiting or abdominal (stomach) pain.   wound healing problems. If you have a surgical or dental procedure, tell your doctor you are taking NEXAVAR. Your treatment may be stopped until after your surgery or until your wound heals.  changes in the electrical activity of your heart called QT prolongation. QT prolongation can cause irregular heartbeats that can be life-threatening. Your doctor may do tests during your treatment with NEXAVAR to check the levels of potassium, magnesium, and calcium in your blood, and check the electrical activity of your heart with an ECG. Tell your doctor right away if  you feel faint, lightheaded, dizzy, or feel your heart beating irregularly or fast while taking NEXAVAR.   inflammation of your liver (drug-induced hepatitis). NEXAVAR may cause liver problems that may lead to liver failure and death. Your doctor may stop your treatment with NEXAVAR if you develop changes in certain liver function tests. Call your doctor right away if you develop yellowing of the skin or white part of your eyes (jaundice), dark "tea-colored" urine, light-colored bowel movements (stools), worsening nausea, worsening vomiting, abdominal pain.   birth defects or death of an unborn baby. Avoid becoming pregnant while taking NEXAVAR and for at least 2 weeks after stopping your treatment. Men and women should use birth control during and at least 2 weeks after NEXAVAR therapy. Talk with your doctor about effective birth control methods. Call your doctor right away if you become pregnant.   change in thyroid hormone levels. If you have differentiated thyroid carcinoma, you can have changes in your thyroid hormone levels when taking NEXAVAR. Your doctor should monitor thyroid hormone levels every month and may need to increase your dose of thyroid medicine.      EDUCATIONAL MATERIALS GIVEN AND REVIEWED: Specific Instructions Sheets: Nexavar, Zofran   SELF CARE ACTIVITIES WHILE ON CHEMOTHERAPY: Try to drink 64oz of decaffeinated beverages or water a day. No alcohol intake., No aspirin or other medications unless approved by your oncologist., Eat foods that are light and easy to digest., Eat foods at cold or room temperature., No fried, fatty, or spicy foods immediately before or after treatment., Have teeth cleaned professionally before starting treatment. Keep dentures and partial plates clean., Use soft toothbrush and do not use mouthwashes that contain alcohol. Biotene is a good mouthwash that is available at most pharmacies or may be ordered by calling [phone]., Use warm salt  water gargles (1 teaspoon salt per 1 quart warm water) before and after meals and at bedtime. Or you may rinse with 2 tablespoons of three -percent hydrogen peroxide mixed in eight ounces of water., Always use sunscreen with SPF (Sun Protection Factor) of 30 or higher., Use your nausea medication as directed to prevent nausea., Use your stool softener or laxative  as directed to prevent constipation. and Use your anti-diarrheal medication as directed to stop diarrhea.  Please wash your hands for at least 30 seconds using warm soapy water. Handwashing is the #1 way to prevent the spread of germs. Stay away from sick people or people who are getting over a cold. If you develop respiratory systems such as green/yellow mucus production or productive cough or persistent cough let us know and we will see if you need an antibiotic. It is a good idea to keep a pair of gloves on when going into grocery stores/[employer] to decrease your risk of coming into contact with germs on the carts, etc. Carry alcohol hand gel with you at all times and use it frequently if out in public. All foods need to be cooked thoroughly. No raw foods. No medium or undercooked meats, eggs. If your food is cooked medium well, it does not need to be hot pink or saturated with bloody liquid at all. Vegetables and fruits need to be washed/rinsed under the faucet with a dish detergent before being consumed. You can eat raw fruits and vegetables unless we tell you otherwise but it would be best if you cooked them or bought frozen. Do not eat off of salad bars or hot bars unless you really trust the cleanliness of the restaurant. If you need dental work, please let Dr. Whitney Muse know before you go for your appointment so that we can coordinate the best possible time for you in regards to your chemo regimen. You need to also let your dentist know that you are actively taking chemo. We may need to do labs prior to your dental appointment. We also want your  bowels moving at least every other day. If this is not happening, we need to know so that we can get you on a bowel regimen to help you go.     MEDICATIONS: You have been given prescriptions for the following medications:  Zofran 8mg  tablet. Take 1 tablet every 8 hours as needed for nausea/vomiting. (this can constipate)  Nexavar (sorafenib) 200mg . Take 1 tablet in the am and 1 tablet in the pm. Take medication 1 hour before or 2 hours after a meal (on an empty stomach).   Over-the-Counter Meds:  Miralax 1 capful in 8 oz of fluid daily. May increase to two times a day if needed. This is a stool softener. If this doesn't work proceed you can add:  Senokot S  - start with 1 tablet two times a day and increase to 4 tablets two times a day if needed. (total of 8 tablets in a 24 hour period). This is a stimulant laxative.   Call us if this does not help your bowels move.   Imodium 2mg  capsule. Take 2 capsules after the 1st loose stool and then 1 capsule every 2 hours until you go a total of 12 hours without having a loose stool. Call the [facility] if loose stools continue.     SYMPTOMS TO REPORT AS SOON AS POSSIBLE AFTER TREATMENT:  FEVER GREATER THAN 100.5 F  CHILLS WITH OR WITHOUT FEVER  NAUSEA AND VOMITING THAT IS NOT CONTROLLED WITH YOUR NAUSEA MEDICATION  UNUSUAL SHORTNESS OF BREATH  UNUSUAL BRUISING OR BLEEDING  TENDERNESS IN MOUTH AND THROAT WITH OR WITHOUT PRESENCE OF ULCERS  URINARY PROBLEMS  BOWEL PROBLEMS  UNUSUAL RASH    Wear comfortable clothing and clothing appropriate for easy access to any Portacath or PICC line. Let us know if there  is anything that we can do to make your therapy better!      I have been informed and understand all of the instructions given to me and have received a copy. I have been instructed to call the clinic [phone] or my family physician as soon as possible for continued medical care, if indicated. I do not have any  more questions at this time but understand that I may call the [facility] or the Patient Navigator at [phone] during office hours should I have questions or need assistance in obtaining follow-up care.  Sorafenib Oral Tablet What is this medicine? SORAFENIB (soe RAF e nib) is a chemotherapy drug. It targets specific proteins within cancer cells and stops the cancer cell from growing. This medicine is used to treat liver cancer and kidney cancer. This medicine may be used for other purposes; ask your health care provider or pharmacist if you have questions. COMMON BRAND NAME(S): Nexavar What should I tell my health care provider before I take this medicine? They need to know if you have any of these conditions: -bleeding problems -heart disease -high blood pressure -kidney disease -liver disease -lung cancer -recent surgery -an unusual or allergic reaction to sorafenib, other medicines, foods, dyes, or preservatives -pregnant or trying to get pregnant -breast-feeding How should I use this medicine? Take this medicine by mouth with a glass of water. Follow the directions on the prescription label. Do not cut, crush or chew this medicine. Take this medicine on an empty stomach, at least 1 hour before or 2 hours after meals. Do not take with food. Take your medicine at regular intervals. Do not take it more often than directed. Do not stop taking except on your doctor's advice. Talk to your pediatrician regarding the use of this medicine in children. Special care may be needed. Overdosage: If you think you have taken too much of this medicine contact a poison control center or emergency room at once. NOTE: This medicine is only for you. Do not share this medicine with others. What if I miss a dose? If you miss a dose, take it as soon as you can. If it is almost time for your next dose, take only that dose. Do not take double or extra doses. What may interact with this medicine? This  medicine may interact with the following medications: -carbamazepine -dexamethasone -medicines for seizures like carbamazepine, phenobarbital, phenytoin -neomycin -rifabutin -rifampin -St. John's Wort -warfarin This list may not describe all possible interactions. Give your health care provider a list of all the medicines, herbs, non-prescription drugs, or dietary supplements you use. Also tell them if you smoke, drink alcohol, or use illegal drugs. Some items may interact with your medicine. What should I watch for while using this medicine? This drug may make you feel generally unwell. This is not uncommon, as chemotherapy can affect healthy cells as well as cancer cells. Report any side effects. Continue your course of treatment even though you feel ill unless your doctor tells you to stop. Men and women should use effective birth control while taking this medicine and for 2 weeks after stopping this medicine. Do not become pregnant while taking this medicine. Women should inform their doctor if they wish to become pregnant or think they might be pregnant. There is a potential for serious side effects to an unborn child. Talk to your health care professional or pharmacist for more information. Do not breast-feed an infant while taking this medicine. If you are  going to have surgery or any other procedures, tell your doctor you are taking this medicine. What side effects may I notice from receiving this medicine? Side effects that you should report to your doctor or health care professional as soon as possible: -allergic reactions like skin rash, itching or hives, swelling of the face, lips, or tongue -black, tarry stools -breathing problems -chest pain or chest tightness -dark urine -dizziness -fast or irregular heartbeat -feeling faint or lightheaded -high fever -light-colored stools -nausea, vomiting -redness, blistering, peeling or loosening of the skin, including inside the  mouth -right upper belly pain -sores on the hands or feet -spitting up blood or brown material that looks like coffee grounds -stomach pain -yellowing of the eyes or skinSide effects that usually do not require medical attention (report to your doctor or health care professional if they continue or are bothersome): -diarrhea -hair loss -loss of appetite -tiredness -weight loss This list may not describe all possible side effects. Call your doctor for medical advice about side effects. You may report side effects to FDA at 1-800-FDA-1088. Where should I keep my medicine? Keep out of the reach of children. Store at room temperature between 15 and 30 degrees C (59 and 86 degrees F). Protect from moisture. Throw away any unused medicine after the expiration date. NOTE: This sheet is a summary. It may not cover all possible information. If you have questions about this medicine, talk to your doctor, pharmacist, or health care provider.  2015, [employer]. (2011-02-26 14:11:09) Ondansetron tablets What is this medicine? ONDANSETRON (on DAN se tron) is used to treat nausea and vomiting caused by chemotherapy. It is also used to prevent or treat nausea and vomiting after surgery. This medicine may be used for other purposes; ask your health care provider or pharmacist if you have questions. COMMON BRAND NAME(S): Zofran What should I tell my health care provider before I take this medicine? They need to know if you have any of these conditions: -heart disease -history of irregular heartbeat -liver disease -low levels of magnesium or potassium in the blood -an unusual or allergic reaction to ondansetron, granisetron, other medicines, foods, dyes, or preservatives -pregnant or trying to get pregnant -breast-feeding How should I use this medicine? Take this medicine by mouth with a glass of water. Follow the directions on your prescription label. Take your doses at regular intervals.  Do not take your medicine more often than directed. Talk to your pediatrician regarding the use of this medicine in children. Special care may be needed. Overdosage: If you think you have taken too much of this medicine contact a poison control center or emergency room at once. NOTE: This medicine is only for you. Do not share this medicine with others. What if I miss a dose? If you miss a dose, take it as soon as you can. If it is almost time for your next dose, take only that dose. Do not take double or extra doses. What may interact with this medicine? Do not take this medicine with any of the following medications: -apomorphine -certain medicines for fungal infections like fluconazole, itraconazole, ketoconazole, posaconazole, voriconazole -cisapride -dofetilide -dronedarone -pimozide -thioridazine -ziprasidone This medicine may also interact with the following medications: -carbamazepine -certain medicines for depression, anxiety, or psychotic disturbances -fentanyl -linezolid -MAOIs like Carbex, Eldepryl, Marplan, Nardil, and Parnate -methylene blue (injected into a vein) -other medicines that prolong the QT interval (cause an abnormal heart rhythm) -phenytoin -rifampicin -tramadol This list may not describe all possible  interactions. Give your health care provider a list of all the medicines, herbs, non-prescription drugs, or dietary supplements you use. Also tell them if you smoke, drink alcohol, or use illegal drugs. Some items may interact with your medicine. What should I watch for while using this medicine? Check with your doctor or health care professional right away if you have any sign of an allergic reaction. What side effects may I notice from receiving this medicine? Side effects that you should report to your doctor or health care professional as soon as possible: -allergic reactions like skin rash, itching or hives, swelling of the face, lips or tongue -breathing  problems -confusion -dizziness -fast or irregular heartbeat -feeling faint or lightheaded, falls -fever and chills -loss of balance or coordination -seizures -sweating -swelling of the hands or feet -tightness in the chest -tremors -unusually weak or tired Side effects that usually do not require medical attention (report to your doctor or health care professional if they continue or are bothersome): -constipation or diarrhea -headache This list may not describe all possible side effects. Call your doctor for medical advice about side effects. You may report side effects to FDA at 1-800-FDA-1088. Where should I keep my medicine? Keep out of the reach of children. Store between 2 and 30 degrees C (36 and 86 degrees F). Throw away any unused medicine after the expiration date. NOTE: This sheet is a summary. It may not cover all possible information. If you have questions about this medicine, talk to your doctor, pharmacist, or health care provider.  2015, [employer] Standard. (2013-01-17 16:27:45)

## 2015-01-28 NOTE — Patient Instructions (Addendum)
[place]   CHEMOTHERAPY INSTRUCTIONS  The most common side effects with NEXAVAR include:   Diarrhea (frequent or loose bowel movements) Tiredness Infection Hair thinning or patchy hair loss Rash Weight loss Loss of appetite Nausea Stomach (abdominal) pain  How to manage side effects:  High Blood Pressure--  This is a common side effect when you take NEXAVAR and it usually happens soon after your treatment. Your blood pressure should be checked every week during the first 6 weeks of taking NEXAVAR and then regularly after that  If your blood pressure is high, it should be treated. High blood pressure can only be treated with medicine from a doctor's prescription  If the high blood pressure does not get better, even with the medicine, you might have to stop taking NEXAVAR, but this is something your doctor will tell you   Diarrhea --  Loose bowel movements may not sound serious, but they can lead to a loss of water in your body and weight loss. Your doctor can give you medicine for diarrhea, but you must first tell your doctor about the problem. Below are some tips that can help you with loose bowel movements.  Things you CAN do (make sure you ask your doctor first): Eat a number of small meals a day and ask your doctor about foods that are good for you  Drink a lot of water or clear liquids, but not anything very hot or cold, and drink at least 1 glass after each loose bowel movement  Take whatever medicine your doctor prescribes to help with diarrhea  Things you should NOT do: Do not eat dairy products like milk and ice cream, high fat foods like red meat and cheese, and high-fiber foods like broccoli and fiber cereals  Do not eat spicy foods or foods that give you gas, like beans  Do not drink alcohol, coffee, tea, or sodas with caffeine (like colas)  Do not drink prune juice or orange juice  Do not chew sugar-free gum or candy (made with  sorbitol)  Rash --  Below are some tips that can help you manage rashes.  Things you CAN do (make sure you ask your doctor first): Use a moisturizer with no perfume or added ingredients after you take a shower or bath  Use anti-itch products  Use a mild soap  If you have to be outside, use a sunblock with at least 30 SPF  Wear loose clothes  Wear a hat with a brim to block the sun  Things you should NOT do: Do not use hot water or take hot showers  Do not sit in the sun   Feeling Tired--  Using NEXAVAR can make you feel more tired than normal. This tired feeling will not always go away, even with a lot of rest and sleep. Tell your doctor about feeling tired. Your doctor will try to figure out what is making you tired. It could be NEXAVAR. It could be the cancer. It could be what you are eating. It could be not getting enough sleep. Below are some tips that can help you if you are feeling tired.  Things you CAN do (make sure you ask your doctor first): Do things that need more energy in the morning, or at times when you feel you have more energy  Keep busy with things that relax you, like games, music, reading, or visiting friends  Keep moving during the day  Exercise as much  as you can, but ask your doctor if it is okay and what kind of exercise you should try  Things you should NOT do: Do not try to do everything. Decide what is most important  Hand Foot Skin Reaction (HFSR) --  HFSR is a skin problem that causes redness, pain, swelling, or blisters on the palms of your hands and soles of your feet. Most of the time it happens in the first 6 weeks of treatment.  Signs & Symptoms of HFSR: A numb feeling or "pins-and-needles", Sensitivity to warm or hot objects (like a coffee mug), Tenderness or soreness, Burning feeling, Redness, Swelling, Buildup of hard skin, Dry or cracked skin or blisters, Peeling and/or flaking skin.  We need to know if any of these things develop!!!!    There are ways to manage HFSR, if you get it.  Things that you CAN do (make sure you ask your doctor first): Use creams and/or lotions on your hands and/or feet. Use a lot of cream and/or lotion daily or leave it on overnight while you sleep. Cover with gloves or socks to make sure it stays on  See a skin or foot doctor about calluses or other problems with your feet  Use cotton socks, shoe pads, or soft shoes  Soak skin in an Epsom salt bath with warm water  Use ice packs on areas of skin that hurt, and keep it there for about 20 minutes (do not put ice directly on bare skin)  Lift your hands or feet up above the rest of your body when sitting or lying down  Wear loose clothes and shoes  Things you should NOT do: Do not do things that rub your feet, like jogging, aerobics, or long walks  Do not use anything that rubs your hands, like tools that need pressure to work  Do not rub your skin with a towel after a shower or bath. It can make your skin problems worse  Do not stand for a long time  Do not keep your hands or feet in hot water for a long time, for example, while washing dishes or bathing  Do not use rubber gloves with hot water because the rubber can make your skin feel hotter  Do not touch strong cleaners (soaps) that you might use around the house  Do not sit in a hot place or in the sun      NEXAVAR may interact with certain other medicines and cause serious side effects so tell your doctor about all medicines you take including prescription and over-the-counter (OTC) medicines, vitamins, or herbal supplements. Especially tell your doctor if you are taking the following medicines: warfarin (Coumadin, Jantoven), neomycin, St. Johns Wort, dexamethasone, phenytoin (Fosphenytoin sodium, Dilantin, Phenytek), carbamazepine (Carbatrol, Equetro, Tegretol, Teril, Epitol), rifampin (Rifater, Rifamate, Rifadin, Rimactane), rifabutin (Mycobutin), phenobarbital. NEXAVAR may cause serious  side effects, including:  decreased blood flow to the heart and heart attack. Get emergency help right away and call your doctor if you have chest pain, shortness of breath, feel lightheaded or faint, have nausea or vomiting, or you are sweating a lot.   bleeding problems. Bleeding is a common side effect of NEXAVAR that can be serious and sometimes lead to death. Tell your doctor if you have any bleeding or easy bruising while taking NEXAVAR.   high blood pressure. High blood pressure is a common side effect of NEXAVAR and can be serious. Your blood pressure should be checked every week during the first 6  weeks of starting therapy and then regularly, thereafter. If your blood pressure is high, it should be treated.   a skin problem called hand-foot skin reaction. This causes redness, pain, swelling, or blisters on the palms of your hands and soles of your feet. Your doctor may change your dose or stop treatment for a while.   serious skin and mouth reactions. NEXAVAR can cause serious skin and mouth reactions which can be life-threatening. Tell your doctor if you have skin rash, blistering and peeling of the skin, blistering and peeling on the inside of your mouth.   an opening in the wall of your stomach or intestines (perforation of the bowel). Tell your doctor right away if you get high fever, nausea, vomiting or abdominal (stomach) pain.   wound healing problems. If you have a surgical or dental procedure, tell your doctor you are taking NEXAVAR. Your treatment may be stopped until after your surgery or until your wound heals.  changes in the electrical activity of your heart called QT prolongation. QT prolongation can cause irregular heartbeats that can be life-threatening. Your doctor may do tests during your treatment with NEXAVAR to check the levels of potassium, magnesium, and calcium in your blood, and check the electrical activity of your heart with an ECG. Tell your doctor right away if  you feel faint, lightheaded, dizzy, or feel your heart beating irregularly or fast while taking NEXAVAR.   inflammation of your liver (drug-induced hepatitis). NEXAVAR may cause liver problems that may lead to liver failure and death. Your doctor may stop your treatment with NEXAVAR if you develop changes in certain liver function tests. Call your doctor right away if you develop yellowing of the skin or white part of your eyes (jaundice), dark "tea-colored" urine, light-colored bowel movements (stools), worsening nausea, worsening vomiting, abdominal pain.   birth defects or death of an unborn baby. Avoid becoming pregnant while taking NEXAVAR and for at least 2 weeks after stopping your treatment. Men and women should use birth control during and at least 2 weeks after NEXAVAR therapy. Talk with your doctor about effective birth control methods. Call your doctor right away if you become pregnant.   change in thyroid hormone levels. If you have differentiated thyroid carcinoma, you can have changes in your thyroid hormone levels when taking NEXAVAR. Your doctor should monitor thyroid hormone levels every month and may need to increase your dose of thyroid medicine.      EDUCATIONAL MATERIALS GIVEN AND REVIEWED: Specific Instructions Sheets: Nexavar, Zofran   SELF CARE ACTIVITIES WHILE ON CHEMOTHERAPY: Try to drink 64oz of decaffeinated beverages or water a day. No alcohol intake., No aspirin or other medications unless approved by your oncologist., Eat foods that are light and easy to digest., Eat foods at cold or room temperature., No fried, fatty, or spicy foods immediately before or after treatment., Have teeth cleaned professionally before starting treatment. Keep dentures and partial plates clean., Use soft toothbrush and do not use mouthwashes that contain alcohol. Biotene is a good mouthwash that is available at most pharmacies or may be ordered by calling [phone]., Use warm salt  water gargles (1 teaspoon salt per 1 quart warm water) before and after meals and at bedtime. Or you may rinse with 2 tablespoons of three -percent hydrogen peroxide mixed in eight ounces of water., Always use sunscreen with SPF (Sun Protection Factor) of 30 or higher., Use your nausea medication as directed to prevent nausea., Use your stool softener or laxative  as directed to prevent constipation. and Use your anti-diarrheal medication as directed to stop diarrhea.  Please wash your hands for at least 30 seconds using warm soapy water. Handwashing is the #1 way to prevent the spread of germs. Stay away from sick people or people who are getting over a cold. If you develop respiratory systems such as green/yellow mucus production or productive cough or persistent cough let us know and we will see if you need an antibiotic. It is a good idea to keep a pair of gloves on when going into grocery stores/[employer] to decrease your risk of coming into contact with germs on the carts, etc. Carry alcohol hand gel with you at all times and use it frequently if out in public. All foods need to be cooked thoroughly. No raw foods. No medium or undercooked meats, eggs. If your food is cooked medium well, it does not need to be hot pink or saturated with bloody liquid at all. Vegetables and fruits need to be washed/rinsed under the faucet with a dish detergent before being consumed. You can eat raw fruits and vegetables unless we tell you otherwise but it would be best if you cooked them or bought frozen. Do not eat off of salad bars or hot bars unless you really trust the cleanliness of the restaurant. If you need dental work, please let Dr. Whitney Muse know before you go for your appointment so that we can coordinate the best possible time for you in regards to your chemo regimen. You need to also let your dentist know that you are actively taking chemo. We may need to do labs prior to your dental appointment. We also want your  bowels moving at least every other day. If this is not happening, we need to know so that we can get you on a bowel regimen to help you go.     MEDICATIONS: You have been given prescriptions for the following medications:  Zofran 8mg  tablet. Take 1 tablet every 8 hours as needed for nausea/vomiting. (this can constipate)  Nexavar (sorafenib) 200mg . Take 1 tablet in the am and 1 tablet in the pm. Take medication 1 hour before or 2 hours after a meal (on an empty stomach).   Over-the-Counter Meds:  Miralax 1 capful in 8 oz of fluid daily. May increase to two times a day if needed. This is a stool softener. If this doesn't work proceed you can add:  Senokot S  - start with 1 tablet two times a day and increase to 4 tablets two times a day if needed. (total of 8 tablets in a 24 hour period). This is a stimulant laxative.   Call us if this does not help your bowels move.   Imodium 2mg  capsule. Take 2 capsules after the 1st loose stool and then 1 capsule every 2 hours until you go a total of 12 hours without having a loose stool. Call the [facility] if loose stools continue.     SYMPTOMS TO REPORT AS SOON AS POSSIBLE AFTER TREATMENT:  FEVER GREATER THAN 100.5 F  CHILLS WITH OR WITHOUT FEVER  NAUSEA AND VOMITING THAT IS NOT CONTROLLED WITH YOUR NAUSEA MEDICATION  UNUSUAL SHORTNESS OF BREATH  UNUSUAL BRUISING OR BLEEDING  TENDERNESS IN MOUTH AND THROAT WITH OR WITHOUT PRESENCE OF ULCERS  URINARY PROBLEMS  BOWEL PROBLEMS  UNUSUAL RASH    Wear comfortable clothing and clothing appropriate for easy access to any Portacath or PICC line. Let us know if there  is anything that we can do to make your therapy better!      I have been informed and understand all of the instructions given to me and have received a copy. I have been instructed to call the clinic [phone] or my family physician as soon as possible for continued medical care, if indicated. I do not have any  more questions at this time but understand that I may call the [facility] or the Patient Navigator at [phone] during office hours should I have questions or need assistance in obtaining follow-up care.          Sorafenib Oral Tablet What is this medicine? SORAFENIB (soe RAF e nib) is a chemotherapy drug. It targets specific proteins within cancer cells and stops the cancer cell from growing. This medicine is used to treat liver cancer and kidney cancer. This medicine may be used for other purposes; ask your health care provider or pharmacist if you have questions. COMMON BRAND NAME(S): Nexavar What should I tell my health care provider before I take this medicine? They need to know if you have any of these conditions: -bleeding problems -heart disease -high blood pressure -kidney disease -liver disease -lung cancer -recent surgery -an unusual or allergic reaction to sorafenib, other medicines, foods, dyes, or preservatives -pregnant or trying to get pregnant -breast-feeding How should I use this medicine? Take this medicine by mouth with a glass of water. Follow the directions on the prescription label. Do not cut, crush or chew this medicine. Take this medicine on an empty stomach, at least 1 hour before or 2 hours after meals. Do not take with food. Take your medicine at regular intervals. Do not take it more often than directed. Do not stop taking except on your doctor's advice. Talk to your pediatrician regarding the use of this medicine in children. Special care may be needed. Overdosage: If you think you have taken too much of this medicine contact a poison control center or emergency room at once. NOTE: This medicine is only for you. Do not share this medicine with others. What if I miss a dose? If you miss a dose, take it as soon as you can. If it is almost time for your next dose, take only that dose. Do not take double or extra doses. What may interact with this  medicine? This medicine may interact with the following medications: -carbamazepine -dexamethasone -medicines for seizures like carbamazepine, phenobarbital, phenytoin -neomycin -rifabutin -rifampin -St. John's Wort -warfarin This list may not describe all possible interactions. Give your health care provider a list of all the medicines, herbs, non-prescription drugs, or dietary supplements you use. Also tell them if you smoke, drink alcohol, or use illegal drugs. Some items may interact with your medicine. What should I watch for while using this medicine? This drug may make you feel generally unwell. This is not uncommon, as chemotherapy can affect healthy cells as well as cancer cells. Report any side effects. Continue your course of treatment even though you feel ill unless your doctor tells you to stop. Men and women should use effective birth control while taking this medicine and for 2 weeks after stopping this medicine. Do not become pregnant while taking this medicine. Women should inform their doctor if they wish to become pregnant or think they might be pregnant. There is a potential for serious side effects to an unborn child. Talk to your health care professional or pharmacist for more information. Do not breast-feed an  infant while taking this medicine. If you are going to have surgery or any other procedures, tell your doctor you are taking this medicine. What side effects may I notice from receiving this medicine? Side effects that you should report to your doctor or health care professional as soon as possible: -allergic reactions like skin rash, itching or hives, swelling of the face, lips, or tongue -black, tarry stools -breathing problems -chest pain or chest tightness -dark urine -dizziness -fast or irregular heartbeat -feeling faint or lightheaded -high fever -light-colored stools -nausea, vomiting -redness, blistering, peeling or loosening of the skin, including  inside the mouth -right upper belly pain -sores on the hands or feet -spitting up blood or brown material that looks like coffee grounds -stomach pain -yellowing of the eyes or skinSide effects that usually do not require medical attention (report to your doctor or health care professional if they continue or are bothersome): -diarrhea -hair loss -loss of appetite -tiredness -weight loss This list may not describe all possible side effects. Call your doctor for medical advice about side effects. You may report side effects to FDA at 1-800-FDA-1088. Where should I keep my medicine? Keep out of the reach of children. Store at room temperature between 15 and 30 degrees C (59 and 86 degrees F). Protect from moisture. Throw away any unused medicine after the expiration date. NOTE: This sheet is a summary. It may not cover all possible information. If you have questions about this medicine, talk to your doctor, pharmacist, or health care provider.  2015, Elsevier/Gold Standard. (2011-02-26 14:11:09) Ondansetron tablets What is this medicine? ONDANSETRON (on DAN se tron) is used to treat nausea and vomiting caused by chemotherapy. It is also used to prevent or treat nausea and vomiting after surgery. This medicine may be used for other purposes; ask your health care provider or pharmacist if you have questions. COMMON BRAND NAME(S): Zofran What should I tell my health care provider before I take this medicine? They need to know if you have any of these conditions: -heart disease -history of irregular heartbeat -liver disease -low levels of magnesium or potassium in the blood -an unusual or allergic reaction to ondansetron, granisetron, other medicines, foods, dyes, or preservatives -pregnant or trying to get pregnant -breast-feeding How should I use this medicine? Take this medicine by mouth with a glass of water. Follow the directions on your prescription label. Take your doses at regular  intervals. Do not take your medicine more often than directed. Talk to your pediatrician regarding the use of this medicine in children. Special care may be needed. Overdosage: If you think you have taken too much of this medicine contact a poison control center or emergency room at once. NOTE: This medicine is only for you. Do not share this medicine with others. What if I miss a dose? If you miss a dose, take it as soon as you can. If it is almost time for your next dose, take only that dose. Do not take double or extra doses. What may interact with this medicine? Do not take this medicine with any of the following medications: -apomorphine -certain medicines for fungal infections like fluconazole, itraconazole, ketoconazole, posaconazole, voriconazole -cisapride -dofetilide -dronedarone -pimozide -thioridazine -ziprasidone This medicine may also interact with the following medications: -carbamazepine -certain medicines for depression, anxiety, or psychotic disturbances -fentanyl -linezolid -MAOIs like Carbex, Eldepryl, Marplan, Nardil, and Parnate -methylene blue (injected into a vein) -other medicines that prolong the QT interval (cause an abnormal heart rhythm) -phenytoin -rifampicin -  tramadol This list may not describe all possible interactions. Give your health care provider a list of all the medicines, herbs, non-prescription drugs, or dietary supplements you use. Also tell them if you smoke, drink alcohol, or use illegal drugs. Some items may interact with your medicine. What should I watch for while using this medicine? Check with your doctor or health care professional right away if you have any sign of an allergic reaction. What side effects may I notice from receiving this medicine? Side effects that you should report to your doctor or health care professional as soon as possible: -allergic reactions like skin rash, itching or hives, swelling of the face, lips or  tongue -breathing problems -confusion -dizziness -fast or irregular heartbeat -feeling faint or lightheaded, falls -fever and chills -loss of balance or coordination -seizures -sweating -swelling of the hands or feet -tightness in the chest -tremors -unusually weak or tired Side effects that usually do not require medical attention (report to your doctor or health care professional if they continue or are bothersome): -constipation or diarrhea -headache This list may not describe all possible side effects. Call your doctor for medical advice about side effects. You may report side effects to FDA at 1-800-FDA-1088. Where should I keep my medicine? Keep out of the reach of children. Store between 2 and 30 degrees C (36 and 86 degrees F). Throw away any unused medicine after the expiration date. NOTE: This sheet is a summary. It may not cover all possible information. If you have questions about this medicine, talk to your doctor, pharmacist, or health care provider.  2015, Elsevier/Gold Standard. (2013-01-17 16:27:45)

## 2015-01-29 ENCOUNTER — Encounter (HOSPITAL_COMMUNITY): Payer: Medicare Other

## 2015-01-29 DIAGNOSIS — C220 Liver cell carcinoma: Secondary | ICD-10-CM

## 2015-01-29 MED ORDER — LOPERAMIDE HCL 2 MG PO CAPS: ORAL_CAPSULE | ORAL | Status: AC

## 2015-01-29 NOTE — Progress Notes (Signed)
Nexavar teaching done with Pamala Hurry and [facility]. Pamala Hurry signed consent. [facility] did not show up today for visit.

## 2015-01-29 NOTE — Addendum Note (Signed)
Addended by: Gerhard Perches on: 01/29/2015 04:45 PM   Modules accepted: Orders

## 2015-01-30 ENCOUNTER — Ambulatory Visit (HOSPITAL_COMMUNITY): Payer: Medicare Other

## 2015-02-03 ENCOUNTER — Emergency Department (HOSPITAL_COMMUNITY): Payer: Medicare Other

## 2015-02-03 ENCOUNTER — Encounter (HOSPITAL_COMMUNITY): Payer: Self-pay | Admitting: *Deleted

## 2015-02-03 ENCOUNTER — Inpatient Hospital Stay (HOSPITAL_COMMUNITY)
Admission: EM | Admit: 2015-02-03 | Discharge: 2015-02-25 | DRG: 189 | Disposition: E | Payer: Medicare Other | Attending: Internal Medicine | Admitting: Internal Medicine

## 2015-02-03 DIAGNOSIS — K659 Peritonitis, unspecified: Secondary | ICD-10-CM | POA: Diagnosis present

## 2015-02-03 DIAGNOSIS — K219 Gastro-esophageal reflux disease without esophagitis: Secondary | ICD-10-CM | POA: Diagnosis present

## 2015-02-03 DIAGNOSIS — E119 Type 2 diabetes mellitus without complications: Secondary | ICD-10-CM | POA: Diagnosis present

## 2015-02-03 DIAGNOSIS — I8500 Esophageal varices without bleeding: Secondary | ICD-10-CM | POA: Diagnosis present

## 2015-02-03 DIAGNOSIS — K861 Other chronic pancreatitis: Secondary | ICD-10-CM | POA: Diagnosis present

## 2015-02-03 DIAGNOSIS — Z794 Long term (current) use of insulin: Secondary | ICD-10-CM | POA: Diagnosis not present

## 2015-02-03 DIAGNOSIS — C220 Liver cell carcinoma: Secondary | ICD-10-CM | POA: Diagnosis present

## 2015-02-03 DIAGNOSIS — K7460 Unspecified cirrhosis of liver: Secondary | ICD-10-CM | POA: Diagnosis present

## 2015-02-03 DIAGNOSIS — Z7189 Other specified counseling: Secondary | ICD-10-CM | POA: Insufficient documentation

## 2015-02-03 DIAGNOSIS — F17210 Nicotine dependence, cigarettes, uncomplicated: Secondary | ICD-10-CM | POA: Diagnosis present

## 2015-02-03 DIAGNOSIS — N200 Calculus of kidney: Secondary | ICD-10-CM | POA: Diagnosis present

## 2015-02-03 DIAGNOSIS — E663 Overweight: Secondary | ICD-10-CM | POA: Diagnosis present

## 2015-02-03 DIAGNOSIS — B182 Chronic viral hepatitis C: Secondary | ICD-10-CM | POA: Diagnosis present

## 2015-02-03 DIAGNOSIS — Z79899 Other long term (current) drug therapy: Secondary | ICD-10-CM

## 2015-02-03 DIAGNOSIS — Z515 Encounter for palliative care: Secondary | ICD-10-CM | POA: Diagnosis not present

## 2015-02-03 DIAGNOSIS — I10 Essential (primary) hypertension: Secondary | ICD-10-CM | POA: Diagnosis present

## 2015-02-03 DIAGNOSIS — E785 Hyperlipidemia, unspecified: Secondary | ICD-10-CM | POA: Diagnosis present

## 2015-02-03 DIAGNOSIS — J189 Pneumonia, unspecified organism: Secondary | ICD-10-CM | POA: Diagnosis not present

## 2015-02-03 DIAGNOSIS — J9601 Acute respiratory failure with hypoxia: Secondary | ICD-10-CM | POA: Diagnosis not present

## 2015-02-03 DIAGNOSIS — Z452 Encounter for adjustment and management of vascular access device: Secondary | ICD-10-CM

## 2015-02-03 DIAGNOSIS — Z7982 Long term (current) use of aspirin: Secondary | ICD-10-CM

## 2015-02-03 DIAGNOSIS — H269 Unspecified cataract: Secondary | ICD-10-CM | POA: Diagnosis present

## 2015-02-03 DIAGNOSIS — Z801 Family history of malignant neoplasm of trachea, bronchus and lung: Secondary | ICD-10-CM

## 2015-02-03 DIAGNOSIS — J9690 Respiratory failure, unspecified, unspecified whether with hypoxia or hypercapnia: Secondary | ICD-10-CM | POA: Diagnosis present

## 2015-02-03 DIAGNOSIS — R0602 Shortness of breath: Secondary | ICD-10-CM | POA: Diagnosis present

## 2015-02-03 DIAGNOSIS — Z6821 Body mass index (BMI) 21.0-21.9, adult: Secondary | ICD-10-CM

## 2015-02-03 LAB — CBC WITH DIFFERENTIAL/PLATELET
BASOS PCT: 0 %
Basophils Absolute: 0 10*3/uL (ref 0.0–0.1)
EOS PCT: 0 %
Eosinophils Absolute: 0 10*3/uL (ref 0.0–0.7)
HEMATOCRIT: 37.6 % — AB (ref 39.0–52.0)
Hemoglobin: 14 g/dL (ref 13.0–17.0)
Lymphocytes Relative: 15 %
Lymphs Abs: 1.6 10*3/uL (ref 0.7–4.0)
MCH: 28.9 pg (ref 26.0–34.0)
MCHC: 37.2 g/dL — AB (ref 30.0–36.0)
MCV: 77.5 fL — AB (ref 78.0–100.0)
MONO ABS: 0.5 10*3/uL (ref 0.1–1.0)
MONOS PCT: 5 %
NEUTROS ABS: 8.6 10*3/uL — AB (ref 1.7–7.7)
Neutrophils Relative %: 80 %
PLATELETS: 362 10*3/uL (ref 150–400)
RBC: 4.85 MIL/uL (ref 4.22–5.81)
RDW: 22.4 % — AB (ref 11.5–15.5)
WBC: 10.8 10*3/uL — ABNORMAL HIGH (ref 4.0–10.5)

## 2015-02-03 LAB — I-STAT TROPONIN, ED: Troponin i, poc: 0.01 ng/mL (ref 0.00–0.08)

## 2015-02-03 LAB — PROTIME-INR
INR: 1.38 (ref 0.00–1.49)
PROTHROMBIN TIME: 17.1 s — AB (ref 11.6–15.2)

## 2015-02-03 LAB — BLOOD GAS, ARTERIAL
ACID-BASE DEFICIT: 1.6 mmol/L (ref 0.0–2.0)
ACID-BASE EXCESS: 0.2 mmol/L (ref 0.0–2.0)
Bicarbonate: 23.3 mEq/L (ref 20.0–24.0)
Bicarbonate: 24.8 mEq/L — ABNORMAL HIGH (ref 20.0–24.0)
DRAWN BY: 234301
DRAWN BY: 234301
FIO2: 100
FIO2: 50
MECHVT: 470 mL
O2 SAT: 89.4 %
O2 Saturation: 99.9 %
PCO2 ART: 36.1 mmHg (ref 35.0–45.0)
PCO2 ART: 34.6 mmHg — AB (ref 35.0–45.0)
PEEP: 5 cmH2O
PEEP: 5 cmH2O
PH ART: 7.409 (ref 7.350–7.450)
PO2 ART: 60.1 mmHg — AB (ref 80.0–100.0)
RATE: 16 resp/min
RATE: 16 resp/min
TCO2: 17.6 mmol/L (ref 0–100)
VT: 470 mL
pH, Arterial: 7.451 — ABNORMAL HIGH (ref 7.350–7.450)
pO2, Arterial: 314 mmHg — ABNORMAL HIGH (ref 80.0–100.0)

## 2015-02-03 LAB — URINALYSIS, ROUTINE W REFLEX MICROSCOPIC
GLUCOSE, UA: 100 mg/dL — AB
Hgb urine dipstick: NEGATIVE
Ketones, ur: NEGATIVE mg/dL
LEUKOCYTES UA: NEGATIVE
Nitrite: NEGATIVE
PH: 6 (ref 5.0–8.0)
SPECIFIC GRAVITY, URINE: 1.02 (ref 1.005–1.030)
Urobilinogen, UA: 1 mg/dL (ref 0.0–1.0)

## 2015-02-03 LAB — COMPREHENSIVE METABOLIC PANEL
ALBUMIN: 2.1 g/dL — AB (ref 3.5–5.0)
ALT: 70 U/L — ABNORMAL HIGH (ref 17–63)
ANION GAP: 9 (ref 5–15)
AST: 143 U/L — ABNORMAL HIGH (ref 15–41)
Alkaline Phosphatase: 463 U/L — ABNORMAL HIGH (ref 38–126)
BILIRUBIN TOTAL: 9 mg/dL — AB (ref 0.3–1.2)
BUN: 7 mg/dL (ref 6–20)
CO2: 22 mmol/L (ref 22–32)
Calcium: 9.1 mg/dL (ref 8.9–10.3)
Chloride: 97 mmol/L — ABNORMAL LOW (ref 101–111)
Creatinine, Ser: <0.3 mg/dL — ABNORMAL LOW (ref 0.61–1.24)
Glucose, Bld: 102 mg/dL — ABNORMAL HIGH (ref 65–99)
POTASSIUM: 5.4 mmol/L — AB (ref 3.5–5.1)
Sodium: 128 mmol/L — ABNORMAL LOW (ref 135–145)
TOTAL PROTEIN: 7.6 g/dL (ref 6.5–8.1)

## 2015-02-03 LAB — AMMONIA: AMMONIA: 42 umol/L — AB (ref 9–35)

## 2015-02-03 LAB — CBG MONITORING, ED
GLUCOSE-CAPILLARY: 141 mg/dL — AB (ref 65–99)
GLUCOSE-CAPILLARY: 96 mg/dL (ref 65–99)

## 2015-02-03 LAB — BRAIN NATRIURETIC PEPTIDE: B Natriuretic Peptide: 90 pg/mL (ref 0.0–100.0)

## 2015-02-03 LAB — MRSA PCR SCREENING: MRSA BY PCR: NEGATIVE

## 2015-02-03 LAB — I-STAT CG4 LACTIC ACID, ED: LACTIC ACID, VENOUS: 1.86 mmol/L (ref 0.5–2.0)

## 2015-02-03 LAB — LIPASE, BLOOD: Lipase: 25 U/L (ref 22–51)

## 2015-02-03 LAB — URINE MICROSCOPIC-ADD ON

## 2015-02-03 MED ORDER — MORPHINE BOLUS VIA INFUSION
2.0000 mg | INTRAVENOUS | Status: DC | PRN
Start: 2015-02-03 — End: 2015-02-04
  Administered 2015-02-03: 2 mg via INTRAVENOUS
  Filled 2015-02-03 (×2): qty 2

## 2015-02-03 MED ORDER — ETOMIDATE 2 MG/ML IV SOLN
INTRAVENOUS | Status: AC
  Filled 2015-02-03: qty 20

## 2015-02-03 MED ORDER — SUCCINYLCHOLINE CHLORIDE 20 MG/ML IJ SOLN
INTRAMUSCULAR | Status: AC | PRN
  Administered 2015-02-03: 120 mg via INTRAVENOUS

## 2015-02-03 MED ORDER — SODIUM CHLORIDE 0.9 % IV SOLN
5.0000 mg/h | INTRAVENOUS | Status: DC
  Filled 2015-02-03: qty 10

## 2015-02-03 MED ORDER — MIDAZOLAM HCL 2 MG/2ML IJ SOLN
INTRAMUSCULAR | Status: AC
  Filled 2015-02-03: qty 2

## 2015-02-03 MED ORDER — ROCURONIUM BROMIDE 50 MG/5ML IV SOLN
INTRAVENOUS | Status: AC
  Filled 2015-02-03: qty 2

## 2015-02-03 MED ORDER — SODIUM CHLORIDE 0.9 % IV SOLN
1.0000 mg/h | INTRAVENOUS | Status: DC
  Filled 2015-02-03: qty 10

## 2015-02-03 MED ORDER — SODIUM CHLORIDE 0.9 % IV SOLN
25.0000 ug/h | INTRAVENOUS | Status: DC
  Administered 2015-02-03: 50 ug/h via INTRAVENOUS
  Filled 2015-02-03: qty 50

## 2015-02-03 MED ORDER — SODIUM CHLORIDE 0.9 % IV SOLN
10.0000 ug/h | INTRAVENOUS | Status: DC
  Administered 2015-02-03: 10 ug/h via INTRAVENOUS
  Filled 2015-02-03: qty 50

## 2015-02-03 MED ORDER — MIDAZOLAM HCL 2 MG/2ML IJ SOLN
2.0000 mg | Freq: Once | INTRAMUSCULAR | Status: AC
  Administered 2015-02-03: 2 mg via INTRAVENOUS

## 2015-02-03 MED ORDER — ONDANSETRON HCL 4 MG/2ML IJ SOLN: 4.0000 mg | Freq: Four times a day (QID) | INTRAMUSCULAR | Status: DC | PRN

## 2015-02-03 MED ORDER — MIDAZOLAM BOLUS VIA INFUSION
1.0000 mg | INTRAVENOUS | Status: DC | PRN
  Administered 2015-02-03: 2 mg via INTRAVENOUS
  Filled 2015-02-03: qty 2

## 2015-02-03 MED ORDER — ETOMIDATE 2 MG/ML IV SOLN
INTRAVENOUS | Status: AC | PRN
  Administered 2015-02-03: 20 mg via INTRAVENOUS

## 2015-02-03 MED ORDER — SUCCINYLCHOLINE CHLORIDE 20 MG/ML IJ SOLN
INTRAMUSCULAR | Status: AC
  Filled 2015-02-03: qty 1

## 2015-02-03 MED ORDER — ROCURONIUM BROMIDE 50 MG/5ML IV SOLN
0.5000 mg/kg | Freq: Once | INTRAVENOUS | Status: AC
  Administered 2015-02-03: 28.4 mg via INTRAVENOUS

## 2015-02-03 MED ORDER — FENTANYL BOLUS VIA INFUSION
50.0000 ug | INTRAVENOUS | Status: DC | PRN
  Filled 2015-02-03: qty 50

## 2015-02-03 MED ORDER — MORPHINE SULFATE (PF) 4 MG/ML IV SOLN: 4.0000 mg | Freq: Once | INTRAVENOUS | Status: DC

## 2015-02-03 MED ORDER — LIDOCAINE HCL (CARDIAC) 20 MG/ML IV SOLN
INTRAVENOUS | Status: AC
  Filled 2015-02-03: qty 5

## 2015-02-03 MED ORDER — SODIUM CHLORIDE 0.9 % IV SOLN
1.0000 mg/h | INTRAVENOUS | Status: DC
  Administered 2015-02-03: 1 mg/h via INTRAVENOUS
  Filled 2015-02-03: qty 10

## 2015-02-03 MED ORDER — MIDAZOLAM HCL 5 MG/ML IJ SOLN
0.0000 mg/h | INTRAMUSCULAR | Status: DC
  Administered 2015-02-03: 4 mg/h via INTRAVENOUS
  Administered 2015-02-03: 5 mg/h via INTRAVENOUS
  Filled 2015-02-03: qty 10

## 2015-02-03 MED ORDER — LORAZEPAM BOLUS VIA INFUSION
2.0000 mg | INTRAVENOUS | Status: DC | PRN
  Filled 2015-02-03: qty 2

## 2015-02-03 MED ORDER — MORPHINE SULFATE 25 MG/ML IV SOLN: 2.0000 mg/h | INTRAVENOUS | Status: DC

## 2015-02-03 NOTE — ED Notes (Signed)
Resp at bedside placing pt on Bipap per MD order.

## 2015-02-03 NOTE — Progress Notes (Signed)
Called by EDP for potential admission of this patient. He, on chart, appears to be very ill with [place] and possibly a PNA on CXR. Required emergent intubation upon arrival to ED, CBG was 26. Has no IV access. EDP has discussed case with oncology, Dr. Whitney Muse, who requests patient be kept at [facility]. I feel patient has advanced medical complexity and without critical care medicine, we are not equipped to handle him at [facility]. I have also discussed case with Dr. Whitney Muse, who states she has discussed with patient's brother and they are planning on coming to the hospital to determine whether they wish to pursue a terminal wean. I have conveyed to EDP that if plans are for terminal wean and hospice care, we can certainly admit him at [facility]. Otherwise, will require transfer to [facility] for critical care involvement. I have not directly examined this patient.  Domingo Mend, MD [facility] Pager: [phone]

## 2015-02-03 NOTE — ED Notes (Signed)
Family at bedside. 

## 2015-02-03 NOTE — ED Notes (Signed)
MD at bedside. 

## 2015-02-03 NOTE — Progress Notes (Signed)
Report called to T.Chrissie Noa, RN. Caregiver verbalized understanding of report. Pt transferred via bed to room 337 with nursing staff and family members.

## 2015-02-03 NOTE — H&P (Signed)
[facility]          History and Physical    PCP:   ROBERTSON, Thomasena Edis, PA-C   EDP: [facility]  Chief Complaint:  SOB  HPI: Patient is a 59 year old man with multiple medical comorbidities most significant for hepatocellular carcinoma followed by Dr. Whitney Muse at the [facility]. On the last visit on 10/3 there was discussion of starting a new agent, however it was believed that he would not be able to tolerate it. It was also arranged for him to have a paracentesis to alleviate abdominal discomfort. Patient presents today from his group home with extreme shortness of breath and altered mental status. He was intubated in the emergency department and hence I am unable to obtain any history. His workup in the emergency department is significant for sodium of 128, potassium of 5.4, elevated LFTs, chest x-ray with a possible right mid lung mass versus consolidation. Initially family was not able to be contacted, subsequently they have let us know that they would not like to pursue aggressive treatment. After discussion with oncology and palliative care, decision has been made to pursue a terminal wean.  Allergies:  No Known Allergies    Past Medical History  Diagnosis Date  . Diabetes mellitus   . Hypertension     normal Bmet in 2012  . Hyperlipemia   . Chronic pancreatitis ([place])     calcified  . Tobacco abuse     1/2 pack per day  . Cholelithiasis 2009  . Bronchiectasis 2009    Left lower lobe; normal chest x-ray in 2012  . Cataracts, bilateral   . Overweight(278.02)   . 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 Nephrolithiasis 12/31/2010  . Substance abuse     Cocaine, marijuana, alcohol, stopped 10 yrs ago  . Pancreatitis   . GERD (gastroesophageal reflux disease)   . Chronic hepatitis C with cirrhosis ([place]) 2006    Secondary to hepatitis C and ethanol abuse, which is now in remission; varices-status post banding at [facility]; normal LFTs in 2009;  thrombocytopenia In the past, platelets-153,000 in 2012  . Cirrhosis ([place])   . Esophageal varices (H[facility])     Past Surgical History  Procedure Laterality Date  . Percutaneous liver biopsy  2006  . Multiple extractions with alveoloplasty N/A 10/11/2014    Procedure: MULTIPLE EXTRACTIONS: Teeth # 1, 4, 6, 9, 10, 11, 12, 20, 28,  WITH ALVEOLOPLASTY AND REMOVAL OF BILATREAL MANDIBULAR TORI;  Surgeon: Diona Browner, DDS;  Location: [facility];  Service: Oral Surgery;  Laterality: N/A;    Prior to Admission medications   Medication Sig Start Date End Date Taking? Authorizing Provider  amLODipine (NORVASC) 5 MG tablet Take 5 mg by mouth every morning.    Yes Historical Provider, MD  aspirin 81 MG chewable tablet Chew 81 mg by mouth every morning.   Yes Historical Provider, MD  Cholecalciferol (VITAMIN D) 2000 UNITS tablet Take 2,000 Units by mouth every morning.    Yes Historical Provider, MD  citalopram (CELEXA) 40 MG tablet Take 40 mg by mouth every morning.    Yes Historical Provider, MD  insulin detemir (LEVEMIR) 100 UNIT/ML injection Inject 35 Units into the skin every morning.    Yes Historical Provider, MD  insulin lispro (HUMALOG) 100 UNIT/ML injection Inject 5 Units into the skin 3 (three) times daily before meals.   Yes Historical  Provider, MD  lisinopril (PRINIVIL,ZESTRIL) 40 MG tablet Take 1 tablet (40 mg total) by mouth daily. 12/10/14  Yes Jehanzeb Memon, MD  metFORMIN (GLUCOPHAGE) 1000 MG tablet Take 1,000 mg by mouth 2 (two) times daily with a meal.    Yes Historical Provider, MD  Multiple Vitamin (MULTIVITAMIN) tablet Take 1 tablet by mouth every morning.    Yes Historical Provider, MD  nadolol (CORGARD) 80 MG tablet Take 80 mg by mouth every morning.    Yes Historical Provider, MD  Omega-3 Fatty Acids (FISH OIL) 1000 MG CAPS Take 1,000 mg by mouth every morning.    Yes Historical Provider, MD  omeprazole (PRILOSEC) 20 MG capsule Take 20 mg by mouth every morning.    Yes Historical Provider,  MD  oxyCODONE (OXY IR/ROXICODONE) 5 MG immediate release tablet Take 1 tablet (5 mg total) by mouth every 4 (four) hours as needed for severe pain. 01/27/15  Yes Shannon K Penland, MD  simvastatin (ZOCOR) 10 MG tablet Take 10 mg by mouth at bedtime.    Yes Historical Provider, MD  SORAfenib (NEXAVAR) 200 MG tablet Take 1 tablet (200 mg total) by mouth 2 (two) times daily. Give on an empty stomach 1 hour before or 2 hours after meals. 01/28/15  Yes Thomas S Kefalas, PA-C  traZODone (DESYREL) 150 MG tablet Take 75 mg by mouth at bedtime.    Yes Historical Provider, MD  azithromycin (ZITHROMAX Z-PAK) 250 MG tablet Two tablets on day 1 and one tablet daily thereafter Patient not taking: Reported on 02/07/2015 01/27/15   Shannon K Penland, MD  loperamide (IMODIUM) 2 MG capsule After 1st loose stool take 2 caps. Then 1 cap every 2 hours until 12 hours have passed without having a loose stool. At bedtime take 2 capsules every 4 hours until morning for loose stools. 01/29/15   Shannon K Penland, MD  nystatin (MYCOSTATIN) 100000 UNIT/ML suspension Take 5 mLs (500,000 Units total) by mouth 4 (four) times daily. Patient not taking: Reported on 01/21/2015 01/10/15   Shannon K Penland, MD  ondansetron (ZOFRAN) 8 MG tablet Take 1 tablet (8 mg total) by mouth every 8 (eight) hours as needed for nausea or vomiting. 01/28/15   Shannon K Penland, MD    Social History:  reports that he has been smoking Cigarettes.  He has a 15 pack-year smoking history. He has never used smokeless tobacco. He reports that he does not drink alcohol or use illicit drugs.  Family History  Problem Relation Age of Onset  . Healthy Sister   . Lung cancer Brother   . Healthy Sister   . Healthy Sister   . Healthy Sister   . Healthy Brother   . Healthy Brother   . Diabetes Daughter   . Healthy Son     Review of Systems:  Unable to obtain as patient is currently intubated  Physical Exam: Blood pressure 116/76, pulse 96, temperature  97.6 F (36.4 C), resp. rate 19, height 5' 4" (1.626 m), weight 56.7 kg (125 lb), SpO2 100 %. General: Intubated, sedated, icteric, cachectic gives the appearance of chronic illness HEENT: Dry mucous membranes Neck: No JVD or bruits Cardiovascular: Tachycardic, regular cannot auscultate any murmurs rubs or gallops Lungs: Decreased breath sounds to the right mid and lower lung field, with rhonchi  Throughout Abdomen: Soft, unable to assess tenderness given sedation, decreased bowel sounds Extremities: Trace bilateral edema Neurologic: Unresponsive  Labs on Admission:  Results for orders placed or performed during the hospital encounter   of 02/14/2015 (from the past 48 hour(s))  CBG monitoring, ED     Status: Abnormal   Collection Time: 02/13/2015  7:45 AM  Result Value Ref Range   Glucose-Capillary 141 (H) 65 - 99 mg/dL  Urinalysis, Routine w reflex microscopic (not at [facility])     Status: Abnormal   Collection Time: 02/17/2015  8:30 AM  Result Value Ref Range   Color, Urine AMBER (A) YELLOW    Comment: BIOCHEMICALS MAY BE AFFECTED BY COLOR   APPearance CLOUDY (A) CLEAR   Specific Gravity, Urine 1.020 1.005 - 1.030   pH 6.0 5.0 - 8.0   Glucose, UA 100 (A) NEGATIVE mg/dL   Hgb urine dipstick NEGATIVE NEGATIVE   Bilirubin Urine MODERATE (A) NEGATIVE   Ketones, ur NEGATIVE NEGATIVE mg/dL   Protein, ur TRACE (A) NEGATIVE mg/dL   Urobilinogen, UA 1.0 0.0 - 1.0 mg/dL   Nitrite NEGATIVE NEGATIVE   Leukocytes, UA NEGATIVE NEGATIVE  Urine microscopic-add on     Status: None   Collection Time: 02/08/2015  8:30 AM  Result Value Ref Range   WBC, UA 0-2 <3 WBC/hpf  Blood gas, arterial ([facility] & [facility] ONLY)     Status: Abnormal   Collection Time: 02/20/2015  8:50 AM  Result Value Ref Range   FIO2 100.00    Delivery systems VENTILATOR    Mode PRESSURE REGULATED VOLUME CONTROL    VT 470 mL   LHR 16 resp/min   Peep/cpap 5.0 cm H20   pH, Arterial 7.409 7.350 - 7.450   pCO2 arterial 36.1 35.0 - 45.0 mmHg    pO2, Arterial 314.0 (H) 80.0 - 100.0 mmHg   Bicarbonate 23.3 20.0 - 24.0 mEq/L   TCO2 17.6 0 - 100 mmol/L   Acid-base deficit 1.6 0.0 - 2.0 mmol/L   O2 Saturation 99.9 %   Collection site LEFT BRACHIAL    Drawn by [id number]    Sample type ARTERIAL   POC CBG, ED     Status: None   Collection Time: 02/07/2015  9:11 AM  Result Value Ref Range   Glucose-Capillary 96 65 - 99 mg/dL  CBC with Differential/Platelet     Status: Abnormal   Collection Time: 02/02/2015  9:34 AM  Result Value Ref Range   WBC 10.8 (H) 4.0 - 10.5 K/uL   RBC 4.85 4.22 - 5.81 MIL/uL   Hemoglobin 14.0 13.0 - 17.0 g/dL   HCT 37.6 (L) 39.0 - 52.0 %   MCV 77.5 (L) 78.0 - 100.0 fL   MCH 28.9 26.0 - 34.0 pg   MCHC 37.2 (H) 30.0 - 36.0 g/dL   RDW 22.4 (H) 11.5 - 15.5 %   Platelets 362 150 - 400 K/uL   Neutrophils Relative % 80 %   Neutro Abs 8.6 (H) 1.7 - 7.7 K/uL   Lymphocytes Relative 15 %   Lymphs Abs 1.6 0.7 - 4.0 K/uL   Monocytes Relative 5 %   Monocytes Absolute 0.5 0.1 - 1.0 K/uL   Eosinophils Relative 0 %   Eosinophils Absolute 0.0 0.0 - 0.7 K/uL   Basophils Relative 0 %   Basophils Absolute 0.0 0.0 - 0.1 K/uL  Comprehensive metabolic panel     Status: Abnormal   Collection Time: 01/26/2015  9:34 AM  Result Value Ref Range   Sodium 128 (L) 135 - 145 mmol/L   Potassium 5.4 (H) 3.5 - 5.1 mmol/L   Chloride 97 (L) 101 - 111 mmol/L   CO2 22 22 - 32 mmol/L  Glucose, Bld 102 (H) 65 - 99 mg/dL   BUN 7 6 - 20 mg/dL   Creatinine, Ser <0.30 (L) 0.61 - 1.24 mg/dL   Calcium 9.1 8.9 - 10.3 mg/dL   Total Protein 7.6 6.5 - 8.1 g/dL   Albumin 2.1 (L) 3.5 - 5.0 g/dL   AST 143 (H) 15 - 41 U/L   ALT 70 (H) 17 - 63 U/L   Alkaline Phosphatase 463 (H) 38 - 126 U/L   Total Bilirubin 9.0 (H) 0.3 - 1.2 mg/dL   GFR calc non Af Amer NOT CALCULATED >60 mL/min   GFR calc Af Amer NOT CALCULATED >60 mL/min    Comment: (NOTE) The eGFR has been calculated using the CKD EPI equation. This calculation has not been validated in all  clinical situations. eGFR's persistently <60 mL/min signify possible Chronic Kidney Disease.    Anion gap 9 5 - 15  Brain natriuretic peptide     Status: None   Collection Time: 02/15/2015  9:34 AM  Result Value Ref Range   B Natriuretic Peptide 90.0 0.0 - 100.0 pg/mL  Protime-INR     Status: Abnormal   Collection Time: 02/16/2015  9:34 AM  Result Value Ref Range   Prothrombin Time 17.1 (H) 11.6 - 15.2 seconds   INR 1.38 0.00 - 1.49  Lipase, blood     Status: None   Collection Time: 01/28/2015  9:34 AM  Result Value Ref Range   Lipase 25 22 - 51 U/L  Ammonia     Status: Abnormal   Collection Time: 02/02/2015  9:34 AM  Result Value Ref Range   Ammonia 42 (H) 9 - 35 umol/L  Blood gas, arterial ([facility] & [facility] ONLY)     Status: Abnormal   Collection Time: 02/20/2015 11:40 AM  Result Value Ref Range   FIO2 50.00    Delivery systems VENTILATOR    Mode PRESSURE REGULATED VOLUME CONTROL    VT 470 mL   LHR 16 resp/min   Peep/cpap 5.0 cm H20   pH, Arterial 7.451 (H) 7.350 - 7.450   pCO2 arterial 34.6 (L) 35.0 - 45.0 mmHg   pO2, Arterial 60.1 (L) 80.0 - 100.0 mmHg   Bicarbonate 24.8 (H) 20.0 - 24.0 mEq/L   Acid-Base Excess 0.2 0.0 - 2.0 mmol/L   O2 Saturation 89.4 %   Collection site RIGHT BRACHIAL    Drawn by [id number]    Sample type ARTERIAL   I-stat troponin, ED     Status: None   Collection Time: 02/08/2015 12:11 PM  Result Value Ref Range   Troponin i, poc 0.01 0.00 - 0.08 ng/mL   Comment 3            Comment: Due to the release kinetics of cTnI, a negative result within the first hours of the onset of symptoms does not rule out myocardial infarction with certainty. If myocardial infarction is still suspected, repeat the test at appropriate intervals.   I-Stat CG4 Lactic Acid, ED     Status: None   Collection Time: 02/19/2015 12:13 PM  Result Value Ref Range   Lactic Acid, Venous 1.86 0.5 - 2.0 mmol/L    Radiological Exams on Admission: Dg Chest Portable 1 View  02/02/2015   CLINICAL  DATA:  Repositioned endotracheal tube.  Hypoxia.  EXAM: PORTABLE CHEST 1 VIEW  COMPARISON:  Study obtained earlier in the day  FINDINGS: Endotracheal tube tip is 2.9 cm above the carina. Nasogastric tube tip and side port are below  the diaphragm. No pneumothorax. There is persistent consolidation in the right base with volume loss in the right upper lobe superiorly. There is atelectasis in the left base, stable. No new opacity. Heart size and pulmonary vascularity are normal. No adenopathy.  IMPRESSION: Areas of consolidation and volume loss on the right. Atelectasis left base. No new opacity. Tube positions as described without pneumothorax.   Electronically Signed   By: Lowella Grip III M.D.   On: 02/20/2015 09:13   Dg Chest Portable 1 View  01/28/2015   CLINICAL DATA:  Short of breath  EXAM: PORTABLE CHEST 1 VIEW  COMPARISON:  12/28/2013  FINDINGS: Endotracheal tube tip is in the right mainstem bronchus. There is significant volume loss in the right lung with shift of the mediastinum to the right. The right upper lobe is probably collapsed. Opacity in the right midlung likely represents an element of collapse in the peripheral segment of the right mid lobe. Underlying mass is not excluded. NG tube tip is in the fundus of the stomach. Left lung is clear. No pneumothorax.  IMPRESSION: Right mainstem endotracheal tube intubation with collapse of the right upper lobe and peripheral segment of the right middle lobe. This needs to be retracted. Right mid lung mass is not excluded.   Electronically Signed   By: Marybelle Killings M.D.   On: 02/10/2015 08:42   Dg Chest Port 1v Same Day  01/25/2015   CLINICAL DATA:  Right-sided central line placement.  EXAM: PORTABLE CHEST 1 VIEW  COMPARISON:  Chest x-ray from earlier same day.  FINDINGS: A right-sided internal jugular central line has been placed. Tip of the central line extends laterally into the right subclavian vein.  Endotracheal tube remains well positioned  with tip just above the level of the carina. Enteric tube passes into the stomach.  Vague opacity at the right lung base is probably layering pleural effusion combined with atelectasis. Left lung remains relatively clear, perhaps mild atelectasis at the left lung base. No pneumothorax seen. Cardiomediastinal silhouette is stable in size and configuration.  IMPRESSION: 1. Right-sided IJ central line placed since the previous chest x-ray. Tip of the central line passes laterally into the right subclavian vein. Recommend repositioning. 2. Stable opacity at the right lung base, most likely a combination of small layering pleural effusion and atelectasis. Underlying pneumonia cannot be excluded. These results will be called to the ordering clinician or representative by the Radiologist Assistant, and communication documented in the PACS or zVision Dashboard.   Electronically Signed   By: Franki Cabot M.D.   On: 02/08/2015 11:51    Assessment/Plan Principal Problem:   Acute respiratory failure with hypoxemia (HCC) Active Problems:   HCC (hepatocellular carcinoma) (HCC)   Respiratory failure ([place])   HCAP (healthcare-associated pneumonia)     Acute hypoxemic respiratory failure -Presumably related to potential pneumonia versus lung mass on top of baseline deconditioning and failure to thrive from advanced hepatocellular carcinoma. -Required emergent intubation in the ED, it has now been decided to pursue a terminal wean. -We'll admit to ICU, start morphine drip with parameters for bolus, once morphine started will terminally extubate. Family is at bedside.  Disposition -Anticipate quick Hospital death.     Time Spent on Admission: 85 minutes  HERNANDEZ ACOSTA,ESTELA [facility] Pager: [phone] 02/02/2015, 4:10 PM

## 2015-02-03 NOTE — ED Notes (Signed)
Vitals were lost during transport from regular ED room to negative pressure ED room. Vitals were taken every 10 minutes between 1000 and 1115 but were not validated in Epic prior to transport. RN was at bedside continuously and vitals were WDL although exact values are not known.

## 2015-02-03 NOTE — Consult Note (Signed)
Consultation Note Date: 02/10/2015   Patient Name: Nicholas Becker  DOB: [birth year]  MRN: [id number]  Age / Sex: 59 y.o., male   PCP: Bronson Curb, PA-C Referring Physician: Koleen Nimrod Acost*  Reason for Consultation: Establishing goals of care, Psychosocial/spiritual support and Withdrawal of life-sustaining treatment  Palliative Care Assessment and Plan Summary of Established Goals of Care and Medical Treatment Preferences   Clinical Assessment/Narrative: [name] is intubated and ventilated. Attempts are made to reach his family who live out of town.  His ex wife, Lorriane Shire Investment banker, corporate) and daughter, Colletta [place] arrive at the ED first. Colletta Maryland spends time with her father at his bedside.  Mr. Gratz family arrives; his mother Ladora Daniel, brother Jiro Kiester, Thomas's wife Kalman Shan, Mr. Nitsch's sister and brother in law [place] and [place].  I share with them that Mr. Koelling is very ill and after spending some time with him in the ED we meet with Dr. Whitney Muse, Oncology.  After some time spent in family conference, Mr. Betzold family decides to seek comfort care and have a terminal wean/extubation here at [facility] d/t his terminal cancer and poor prognosis. Coordination with oncology, ED, and hospitalist provided.   Contacts/Participants in Discussion: Primary Decision Maker: Mr. Hank is unable to make his own healthcare decisions at this time, and has named his brother Curtiss Mahmood as his surrogate.    HCPOA: yes  Brother Taym Twist is HCPOA.   Code Status/Advance Care Planning:  DNR  Symptom Management:   Terminal wean protocol.   Psycho-social/Spiritual:   Support System: Mr. Tones has lived at the [facility] group home for about 10 years now.  His family is very active in supporting him and visit frequently from [place], [place].  His has a mother, brother, sister, son, daughter and ex-wife.   Desire for further Chaplaincy support:no :  provided by family, ex-wife Lorriane Shire.   Prognosis: Hours - Days  Discharge Planning:  Anticipate hospital death.        Chief Complaint:  Shortness of Breath History of Present Illness:  Nicholas Becker is a 59 y.o. 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 Per EMS, they were called by [facility] because pt was found unconscious. Upon EMS arrival pt's CBG was 26; his O2 saturation was in the 70s; and pt had pulmonary edema and was placed on CPAP with O2 increasing to 88%. Pt denied abd pain prior to BiPAP.  He was subsequently intubated and remains unresponsive. After conversation with family it is decided to initiate a terminal ventilator wean d/t his terminal cancer and poor prognosis.    Primary Diagnoses  Present on Admission:  . Respiratory failure ([place])  Palliative Review of Systems: Mr. Comunale is unable to participate in ROS dt intubation/ventilation.  I have reviewed the medical record, interviewed the patient and family, and examined the patient. The following aspects are pertinent.  Past Medical History  Diagnosis Date  . Diabetes mellitus   . Hypertension     normal Bmet in 2012  . Hyperlipemia   . Chronic pancreatitis ([place])     calcified  . Tobacco abuse     1/2 pack per day  . Cholelithiasis 2009  . Bronchiectasis 2009    Left lower lobe; normal chest x-ray in 2012  . Cataracts, bilateral   .  Overweight(278.02)   . 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 Nephrolithiasis 12/31/2010  . Substance abuse     Cocaine, marijuana, alcohol, stopped 10 yrs ago  . Pancreatitis   . GERD (gastroesophageal reflux disease)   . 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 Cirrhosis ([place])   . 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 Marital Status: Divorced    Spouse Name: N/A  . Number of Children: N/A  . Years of Education: N/A   Social History Main Topics  . Smoking status: Current Every Day Smoker -- 0.50 packs/day for 30 years    Types: Cigarettes  . Smokeless tobacco: Never Used     Comment: Patient smokes 1 pack a day  . Alcohol Use: No  . Drug Use: No     Comment: quit coaine alcohol and marijuana 10 years ago  . Sexual Activity: No   Other Topics Concern  . None   Social History Narrative   Family History  Problem Relation Age of Onset  . Healthy Sister   . Lung cancer Brother   . Healthy Sister   . Healthy Sister   . Healthy Sister   . Healthy Brother   . Healthy Brother   . Diabetes Daughter   . Healthy Son    Scheduled Meds:  Continuous Infusions: . fentaNYL infusion INTRAVENOUS 50 mcg/hr (02/02/2015 0952)  . midazolam (VERSED) infusion 5 mg/hr (01/27/2015 0956)   PRN Meds:.fentaNYL, midazolam Medications Prior to Admission:  Prior to Admission medications   Medication Sig Start Date End Date Taking? Authorizing Provider  amLODipine (NORVASC) 5 MG tablet Take 5 mg by mouth every morning.    Yes Historical Provider, MD  aspirin 81 MG chewable tablet Chew 81 mg by mouth every morning.   Yes Historical Provider, MD  Cholecalciferol (VITAMIN D) 2000 UNITS tablet Take 2,000 Units by mouth every morning.    Yes Historical Provider, MD  citalopram (CELEXA) 40 MG tablet Take 40 mg by mouth every morning.    Yes Historical Provider, MD  insulin detemir (LEVEMIR) 100 UNIT/ML injection Inject 35 Units into the skin every morning.    Yes Historical Provider, MD  insulin lispro (HUMALOG) 100 UNIT/ML injection Inject 5 Units into the skin 3 (three) times daily before meals.   Yes  Historical Provider, MD  lisinopril (PRINIVIL,ZESTRIL) 40 MG tablet Take 1 tablet (40 mg total) by mouth daily. 12/10/14  Yes Kathie Dike, MD  metFORMIN (GLUCOPHAGE) 1000 MG tablet Take 1,000 mg by mouth 2 (two) times daily with a meal.    Yes Historical Provider, MD  Multiple Vitamin (MULTIVITAMIN) tablet Take 1 tablet by mouth every morning.    Yes Historical Provider, MD  nadolol (CORGARD) 80 MG tablet Take 80 mg by mouth every morning.    Yes Historical Provider, MD  Omega-3 Fatty Acids (FISH OIL) 1000 MG CAPS Take 1,000 mg by mouth every morning.    Yes Historical Provider, MD  omeprazole (PRILOSEC) 20 MG capsule Take 20 mg by mouth every morning.    Yes Historical Provider, MD  oxyCODONE (OXY IR/ROXICODONE) 5 MG immediate release tablet Take 1 tablet (5 mg total) by mouth every 4 (four) hours as needed for severe pain. 01/27/15  Yes Larene Beach  K Penland, MD  simvastatin (ZOCOR) 10 MG tablet Take 10 mg by mouth at bedtime.    Yes Historical Provider, MD  SORAfenib (NEXAVAR) 200 MG tablet Take 1 tablet (200 mg total) by mouth 2 (two) times daily. Give on an empty stomach 1 hour before or 2 hours after meals. 01/28/15  Yes Baird Cancer, PA-C  traZODone (DESYREL) 150 MG tablet Take 75 mg by mouth at bedtime.    Yes Historical Provider, MD  azithromycin (ZITHROMAX Z-PAK) 250 MG tablet Two tablets on day 1 and one tablet daily thereafter Patient not taking: Reported on 02/18/2015 01/27/15   Patrici Ranks, MD  loperamide (IMODIUM) 2 MG capsule After 1st loose stool take 2 caps. Then 1 cap every 2 hours until 12 hours have passed without having a loose stool. At bedtime take 2 capsules every 4 hours until morning for loose stools. 01/29/15   Patrici Ranks, MD  nystatin (MYCOSTATIN) 100000 UNIT/ML suspension Take 5 mLs (500,000 Units total) by mouth 4 (four) times daily. Patient not taking: Reported on 01/21/2015 01/10/15   Patrici Ranks, MD  ondansetron (ZOFRAN) 8 MG tablet Take 1 tablet (8 mg  total) by mouth every 8 (eight) hours as needed for nausea or vomiting. 01/28/15   Patrici Ranks, MD   No Known Allergies CBC:    Component Value Date/Time   WBC 10.8* 02/20/2015 0934   HGB 14.0 02/23/2015 0934   HCT 37.6* 02/16/2015 0934   PLT 362 02/13/2015 0934   MCV 77.5* 02/11/2015 0934   NEUTROABS 8.6* 02/02/2015 0934   LYMPHSABS 1.6 01/31/2015 0934   MONOABS 0.5 02/01/2015 0934   EOSABS 0.0 02/17/2015 0934   BASOSABS 0.0 02/05/2015 0934   Comprehensive Metabolic Panel:    Component Value Date/Time   NA 128* 02/06/2015 0934   K 5.4* 01/30/2015 0934   CL 97* 02/14/2015 0934   CO2 22 02/08/2015 0934   BUN 7 02/02/2015 0934   CREATININE <0.30* 02/14/2015 0934   CREATININE 0.90 08/28/2013 1150   GLUCOSE 102* 02/24/2015 0934   CALCIUM 9.1 01/26/2015 0934   CALCIUM REFERT 12/09/2014 1746   AST 143* 02/08/2015 0934   ALT 70* 02/01/2015 0934   ALKPHOS 463* 02/05/2015 0934   BILITOT 9.0* 02/06/2015 0934   PROT 7.6 02/23/2015 0934   ALBUMIN 2.1* 02/06/2015 0934    Physical Exam: Vital Signs: BP 110/75 mmHg  Pulse 94  Temp(Src) 97.2 F (36.2 C)  Resp 17  Ht 5\' 4"  (1.626 m)  Wt 56.7 kg (125 lb)  BMI 21.45 kg/m2  SpO2 99% SpO2: SpO2: 99 % O2 Device: O2 Device: ETT O2 Flow Rate:   Intake/output summary: No intake or output data in the 24 hours ending 02/02/2015 1457 LBM:   Baseline Weight: Weight: 56.7 kg (125 lb) Most recent weight: Weight: 56.7 kg (125 lb)  Exam Findings:  Constitutional:  Chronically ill appearing.  Resp: ventilated Cardio: NSR in 80-90's         Palliative Performance Scale: 10%              Additional Data Reviewed: Recent Labs     02/11/2015  0934  WBC  10.8*  HGB  14.0  PLT  362  NA  128*  BUN  7  CREATININE  <0.30*     Time In: 1130 Time Out: 1400 Time Total:  2 hours 30 minutes Greater than 50%  of this time was spent counseling and coordinating care related to the above  assessment and plan.  Signed by: Drue Novel,  NP  Drue Novel, NP  02/13/2015, 2:57 PM  Please contact Palliative Medicine Team phone at [phone] for questions and concerns.

## 2015-02-03 NOTE — ED Notes (Signed)
NP Hospitalist at bedside.

## 2015-02-03 NOTE — Progress Notes (Signed)
Paged Dr. Jerilee Hoh to make aware after the patient was to be extubated he would be moved to dept 300 due to ICU Isolation malfunction seal in room ICU 8.

## 2015-02-03 NOTE — Progress Notes (Signed)
Pt mouth breathing. Non rebreather placed for better oxygenation. Pt in bed with eyes closed, unresponsive. Morphine drip at 2 ml/hr. Apnea noted. Will continue to monitor.

## 2015-02-03 NOTE — ED Notes (Signed)
Pt was brought in by [facility] due to being called out for pt being unconscious. CBG was 26 on EMS arrival. From [facility]. Pt didn't eat good meal last night but took his insulin last night. 20g IV rt FA. 1 amp D50, and glucagon given by EMS. EMS found O2 sats to be in the 70's and per EMS pt had "pulmonary edema" and pt was placed on CPAP and O2 sat increased to 88%.

## 2015-02-03 NOTE — ED Notes (Signed)
Called Group home,  [facility], is on her way to ED.

## 2015-02-03 NOTE — ED Notes (Signed)
MD gave verbal order to use central line for blood draws and peripheral IV access but not for central line access.

## 2015-02-03 NOTE — ED Notes (Signed)
Dr. Thomasene Lot at bedside to perform central line insertion emergently. No family available at this time.

## 2015-02-03 NOTE — ED Provider Notes (Addendum)
CSN: [id number]     Arrival date & time 02/06/2015  8119 History  By signing my name below, I, [name], attest that this documentation has been prepared under the direction and in the presence of Nicholas Julio Alm, MD. Electronically Signed: [name], ED Scribe. 02/16/2015. 8:22 AM.   LEVEL 5: ACUITY OF CONDITION   Chief Complaint  Patient presents with  . Shortness of Breath   The history is provided by the EMS personnel and the patient. No language interpreter was used.   PCP: Bronson Curb, PA-C HPI Comments: Nicholas Becker is a 59 y.o. 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 Per EMS, they were called by [facility] because pt was found unconscious. Upon EMS arrival pt's CBG was 26; his O2 saturation was in the 70s; and pt had pulmonary edema and was placed on CPAP with O2 increasing to 88%. Pt denied abd pain prior to BiPAP.   Past Medical History  Diagnosis Date  . Diabetes mellitus   . Hypertension     normal Bmet in 2012  . Hyperlipemia   . Chronic pancreatitis ([place])     calcified  . Tobacco abuse     1/2 pack per day  . Cholelithiasis 2009  . Bronchiectasis 2009    Left lower lobe; normal chest x-ray in 2012  . Cataracts, bilateral   . Overweight(278.02)   . 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 Nephrolithiasis 12/31/2010  . Substance abuse     Cocaine, marijuana, alcohol, stopped 10 yrs ago  . Pancreatitis   . GERD (gastroesophageal reflux disease)   . Chronic hepatitis C with cirrhosis ([place]) 2006    Secondary to hepatitis C and ethanol abuse, which is now in remission; varices-status post banding at [facility]; normal LFTs in 2009; thrombocytopenia In the past, platelets-153,000 in 2012  . Cirrhosis ([place])    . Esophageal varices ([facility])    Past Surgical History  Procedure Laterality Date  . Percutaneous liver biopsy  2006  . Multiple extractions with alveoloplasty N/A 10/11/2014    Procedure: MULTIPLE EXTRACTIONS: Teeth # 1, 4, 6, 9, 10, 11, 12, 20, 28,  WITH ALVEOLOPLASTY AND REMOVAL OF BILATREAL MANDIBULAR TORI;  Surgeon: Nicholas Becker, DDS;  Location: [place];  Service: Oral Surgery;  Laterality: N/A;   Family History  Problem Relation Age of Onset  . Healthy Sister   . Lung Becker Brother   . Healthy Sister   . Healthy Sister   . Healthy Sister   . Healthy Brother   . Healthy Brother   . Diabetes Daughter   . Healthy Son    Social History  Substance Use Topics  . Smoking status: Current Every Day Smoker -- 0.50 packs/day for 30 years    Types: Cigarettes  . Smokeless tobacco: Never Used     Comment: Patient smokes 1 pack a day  . Alcohol Use: No    Review of Systems  Unable to perform ROS: Acuity of condition   Allergies  Review of patient's allergies indicates no known allergies.  Home Medications   Prior to Admission medications   Medication Sig Start Date End Date Taking? Authorizing Provider  amLODipine (NORVASC) 5 MG tablet Take 5 mg by mouth every morning.     Historical  Provider, MD  aspirin 81 MG chewable tablet Chew 81 mg by mouth every morning.    Historical Provider, MD  azithromycin (ZITHROMAX Z-PAK) 250 MG tablet Two tablets on day 1 and one tablet daily thereafter 01/27/15   Nicholas Ranks, MD  Cholecalciferol (VITAMIN D) 2000 UNITS tablet Take 2,000 Units by mouth every morning.     Historical Provider, MD  citalopram (CELEXA) 40 MG tablet Take 40 mg by mouth every morning.     Historical Provider, MD  HYDROcodone-acetaminophen (NORCO/VICODIN) 5-325 MG per tablet Take 1 tablet by mouth every 6 (six) hours as needed for moderate pain.    Historical Provider, MD  ibuprofen (ADVIL,MOTRIN) 800 MG tablet Take 800 mg by mouth 3 (three) times daily as needed.  01/07/15   Historical Provider, MD  insulin detemir (LEVEMIR) 100 UNIT/ML injection Inject 35 Units into the skin every morning.     Historical Provider, MD  insulin lispro (HUMALOG) 100 UNIT/ML injection Inject 5 Units into the skin 3 (three) times daily before meals.    Historical Provider, MD  lisinopril (PRINIVIL,ZESTRIL) 40 MG tablet Take 1 tablet (40 mg total) by mouth daily. 12/10/14   Kathie Dike, MD  loperamide (IMODIUM) 2 MG capsule After 1st loose stool take 2 caps. Then 1 cap every 2 hours until 12 hours have passed without having a loose stool. At bedtime take 2 capsules every 4 hours until morning for loose stools. 01/29/15   Nicholas Ranks, MD  metFORMIN (GLUCOPHAGE) 1000 MG tablet Take 1,000 mg by mouth 2 (two) times daily with a meal.     Historical Provider, MD  Multiple Vitamin (MULTIVITAMIN) tablet Take 1 tablet by mouth every morning.     Historical Provider, MD  nadolol (CORGARD) 80 MG tablet Take 80 mg by mouth every morning.     Historical Provider, MD  nystatin (MYCOSTATIN) 100000 UNIT/ML suspension Take 5 mLs (500,000 Units total) by mouth 4 (four) times daily. Patient not taking: Reported on 01/21/2015 01/10/15   Nicholas Ranks, MD  Omega-3 Fatty Acids (FISH OIL) 1000 MG CAPS Take 1,000 mg by mouth every morning.     Historical Provider, MD  omeprazole (PRILOSEC) 20 MG capsule Take 20 mg by mouth every morning.     Historical Provider, MD  ondansetron (ZOFRAN) 8 MG tablet Take 1 tablet (8 mg total) by mouth every 8 (eight) hours as needed for nausea or vomiting. 01/28/15   Nicholas Ranks, MD  oxyCODONE (OXY IR/ROXICODONE) 5 MG immediate release tablet Take 1 tablet (5 mg total) by mouth every 4 (four) hours as needed for severe pain. 01/27/15   Nicholas Ranks, MD  simvastatin (ZOCOR) 10 MG tablet Take 10 mg by mouth at bedtime.     Historical Provider, MD  SORAfenib (NEXAVAR) 200 MG tablet Take 1 tablet (200 mg total) by mouth 2 (two) times daily. Give on an empty  stomach 1 hour before or 2 hours after meals. 01/28/15   Nicholas Cancer, PA-C  traZODone (DESYREL) 150 MG tablet Take 75 mg by mouth at bedtime.     Historical Provider, MD   Triage Vitals: Pulse 90  Resp 29  Wt 125 lb (56.7 kg)  SpO2 68% Physical Exam  Constitutional: He appears well-developed and well-nourished. He appears distressed.  HENT:  Head: Normocephalic.  Eyes: EOM are normal. Scleral icterus is present.  Pulmonary/Chest: He is in respiratory distress. He has rhonchi (bilateral with left worse than right). He has rales (bilateral with  left worse than right).  Abdominal: Soft. He exhibits distension (distended abd with evidence of recent peritonitis in RLQ). There is no tenderness.  Musculoskeletal: Normal range of motion.  Neurological: He is alert.  Nursing note and vitals reviewed.  ED Course  .Central Line Date/Time: 02/18/2015 2:11 PM Performed by: Zenovia Jarred LYN Authorized by: Zenovia Jarred LYN Consent: Verbal consent not obtained. Written consent not obtained. Preparation: skin prepped with 2% chlorhexidine Skin prep agent dried: skin prep agent completely dried prior to procedure Sterile barriers: all five maximum sterile barriers used - cap, mask, sterile gown, sterile gloves, and large sterile sheet Hand hygiene: hand hygiene performed prior to central venous catheter insertion Location details: right internal jugular Patient position: Trendelenburg Catheter type: triple lumen Pre-procedure: landmarks identified Ultrasound guidance: yes Sterile ultrasound techniques: sterile gel and sterile probe covers were used Number of attempts: 2 Successful placement: no Post-procedure: line sutured Assessment: blood return through all ports Patient tolerance: Patient tolerated the procedure well with no immediate complications Comments: Incorrect placement, but good enough to draw blood/ give meds.   (including critical care time) DIAGNOSTIC  STUDIES: Oxygen Saturation is 68% on RA, low by my interpretation.   8:46 AM: Consult with oncology, oncologist believes pt is DNR; however, as of 11/2014 pt was full code per chart and pt requested intubation today. Oncologist requests pt remain in [facility] and she will discuss with family regarding end of life care.   Labs Review Labs Reviewed  CBG MONITORING, ED - Abnormal; Notable for the following:    Glucose-Capillary 141 (*)    All other components within normal limits  URINE CULTURE  CBC WITH DIFFERENTIAL/PLATELET  COMPREHENSIVE METABOLIC PANEL  BRAIN NATRIURETIC PEPTIDE  PROTIME-INR  LIPASE, BLOOD  BLOOD GAS, VENOUS  URINALYSIS, ROUTINE W REFLEX MICROSCOPIC (NOT AT [facility])  BLOOD GAS, ARTERIAL  I-STAT CG4 LACTIC ACID, ED  I-STAT TROPOININ, ED    Imaging Review No results found. I have personally reviewed and evaluated these images and lab results as part of my medical decision-making.   MDM  Pt continued to be in respiratory distress on BiPAP with O2 saturation below 80%, 02 saturation 100% FI  Final diagnoses:  None    Patient is incredibly ill 59 year old male presenting respiratory distress on arrival. Patient has history of hepatocellular carcinoma, hepatorenal syndrome, hepatic thrombosis, history of positive PPD, hep C esophageal varices. Patient presented today with icterus, low blood sugar and respiratory distress. Patient was placed on BiPAP with no increasing oxygenation. Asked patient whether he wanted to have a tube and ventilator and he responded 'Yes'.  Most recent code status written on 11/2014 as full code. Most recent documented conversation 1 month ago in notes with Dr. Whitney Muse. We intubated the patient.  Initially he was at 24 to lip he got to the 27th of the lip after tube was taped down and is not a right mainstem. Tube was pulled back. Repeat x-ray done. She has significant bloody secretions that are being suctioned. Concern for TB versus malignancy in his  lung.  Discussed with his oncologist who is the most recently saw him last month.  She states that she would like to keep him here and not sent him to [facility] because she thinks it appropriate to discuss with family to withdrawal of care.  Difficulty getting blood on patient. Waiting for family member, rumored  to arrive, to discuss with her central line to be placed. We'll need a central line in order to get better  access.  It should be placed until a decision is made to decrease level of care.  Meda Coffee, Nicholas L MacKuen, personally performed the services described in this documentation. All medical record entries made by the scribe were at my direction and in my presence.  I have reviewed the chart and discharge instructions and agree that the record reflects my personal performance and is accurate and complete. [place].  02/21/2015. 9:42 AM.   CRITICAL CARE Performed by: Gardiner Sleeper Total critical care time: 1.5 hours Critical care time was exclusive of separately billable procedures and treating other patients. Critical care was necessary to treat or prevent imminent or life-threatening deterioration. Critical care was time spent personally by me on the following activities: development of treatment plan with patient and/or surrogate as well as nursing, discussions with consultants, evaluation of patient's response to treatment, examination of patient, obtaining history from patient or surrogate, ordering and performing treatments and interventions, ordering and review of laboratory studies, ordering and review of radiographic studies, pulse oximetry and re-evaluation of patient's condition.  INTUBATION Performed by: Gardiner Sleeper  Required items: required blood products, implants, devices, and special equipment available Patient identity confirmed: provided demographic data and hospital-assigned identification number Time out: Immediately prior to procedure a "time  out" was called to verify the correct patient, procedure, equipment, support staff and site/side marked as required.  Indications: respiratory failure  Intubation method: Glidescope Laryngoscopy   Preoxygenation: BVM/bipap  Sedatives: Etomidate Paralytic: Succinylcholine  Tube Size: 7.5 cuffed  Post-procedure assessment: chest rise and ETCO2 monitor Breath sounds: equal and absent over the epigastrium Tube secured with: ETT holder Chest x-ray interpreted by radiologist and me.  Chest x-ray findings: endotracheal tube  At 23 at lip when intubated. R main stem, went to pull back and it was at 26 at the lip.    Patient tolerated the procedure well with no immediate complications.    10:57 AM Talked to hospitilist, they think its appropriate for [facility] given his level of critical care needs. Talked to [facility], they accepted. The critical care Dr. Titus Mould agrees he is much more appropriate for [facility] given the complexity of patient's condition.   Unable to reach son.  Person from group home here. She is trying to find number for family member  Called [phone]. Went straight to message. Unable to talk to son here.   11:02 AM Talked again to heme onc- they want him to stay here. Hosptilst does not feel comfortable unless he is on terminal wean since he is is incredibly medically complicated. If full code, a more appropriate place would be [facility]    11:37 AM   Talked to nurse manager who does not think it is appropriate to keep patient in ED until whenever the family arrives and makes the decision to withdrawal care. Caught in a difficult position since the decision has NOT been made yet to withdraw care.  His primary oncologist is here and would like him to stay here and yet he is not appropriate to be admitted to [facility] until the decision has been made to change his code status..  At 1 pm POA came to ED. Sent to see Dr. Whitney Muse.  2:07 PM  Decision made to terminally wean. Will admit  to Dr. Jerilee Hoh as per the plan.  NP for palliative care has been incredibly helpful in caring for the patient nad patient's family's need.      Nicholas Julio Alm, MD 01/31/2015 1415  Nicholas Julio Alm, MD 02/23/2015 1421

## 2015-02-04 LAB — URINE CULTURE

## 2015-02-06 ENCOUNTER — Ambulatory Visit (HOSPITAL_COMMUNITY): Payer: Medicare Other | Admitting: Oncology

## 2015-02-11 ENCOUNTER — Telehealth (HOSPITAL_COMMUNITY): Payer: Self-pay | Admitting: Hematology & Oncology

## 2015-02-11 NOTE — Telephone Encounter (Signed)
I received a phone call from Mr. Nicholas Becker brother Nicholas Becker. I returned his phone call and he was inquiring about his brother's presentation to the emergency department. I briefly reviewed the emergency Department notes.  He stated that his mother is having a hard time processing the events leading to his brother's death. I again reminded Mr. Colberg that his brother had terminal liver cancer. This had been discussed with the family by not only myself at the 2 visits I had with them but also by the treating physicians at [facility]. Nicholas Becker's caretaker was present at both meetings. His mother and other family members were present at the first.  Nicholas Becker asked me to load Nicholas Becker's medical records into [facility] my chart; I again explained to him that his brother would have had to sign up for [facility]'s my chart. Note that the first time I met Nicholas Becker was after his brother was intubated, he remarked at that initial visit that he had written to his brother's physician at [facility] through my chart. He commented that the physician was upset that he was utilizing his brothers my chart access.  Any further requests for medical records will have to go through our medical records dept. Donald Pore MD

## 2015-02-25 NOTE — Progress Notes (Signed)
200 mL of Morphine wasted in sink with Betha Loa, RN witnessed.

## 2015-02-25 NOTE — Progress Notes (Signed)
Family called to reconfirm that they are coming back to hospital. No one answered at this time. Will assume family is still on their way to the hospital.

## 2015-02-25 NOTE — Progress Notes (Signed)
[facility] contacted. Spoke with [facility]. She states pt is a full release and funeral home can pick up.

## 2015-02-25 NOTE — Discharge Summary (Signed)
DEATH SUMMARY Patient was a 59 y/o man with metastatic, end-stage hepatocellular carcinoma who presented to the hospital on 10/10 from his group home with extreme SOB. He was emergently intubated in the ED and I was unable to obtain any history from the patient. Labs with a Na of 128, K of 5.4, elevated LFTs and a CXR with a possible right mid lung mass vs consolidation. Upon his family's arrival, after discussions with oncology and palliative care, decision was made to terminate aggressive management and pursue a terminal wean. Morphine drip was initiated, and once patient achieved adequate sedation, he was extubated, He subsequently expired on 10/11 at around 2 am.  Domingo Mend, MD [facility] Pager: [phone]

## 2015-02-25 NOTE — Progress Notes (Signed)
Nicholas Becker contacted. Informed of pt's death. He request that we wait until the family arrives to the hospital.

## 2015-02-25 NOTE — Progress Notes (Signed)
[place] called three times. Phone number is busy.

## 2015-02-25 DEATH — deceased

## 2015-11-30 IMAGING — US US PARACENTESIS
1 series · 5 of 5 positions shown · non-contrast
Comparison: None.

CLINICAL DATA: Ascites

EXAM:
ULTRASOUND GUIDED  PARACENTESIS

[Series 1: us paracentesis · 0.17mm/px · 5 of 5 slices shown]
[im 1/5]
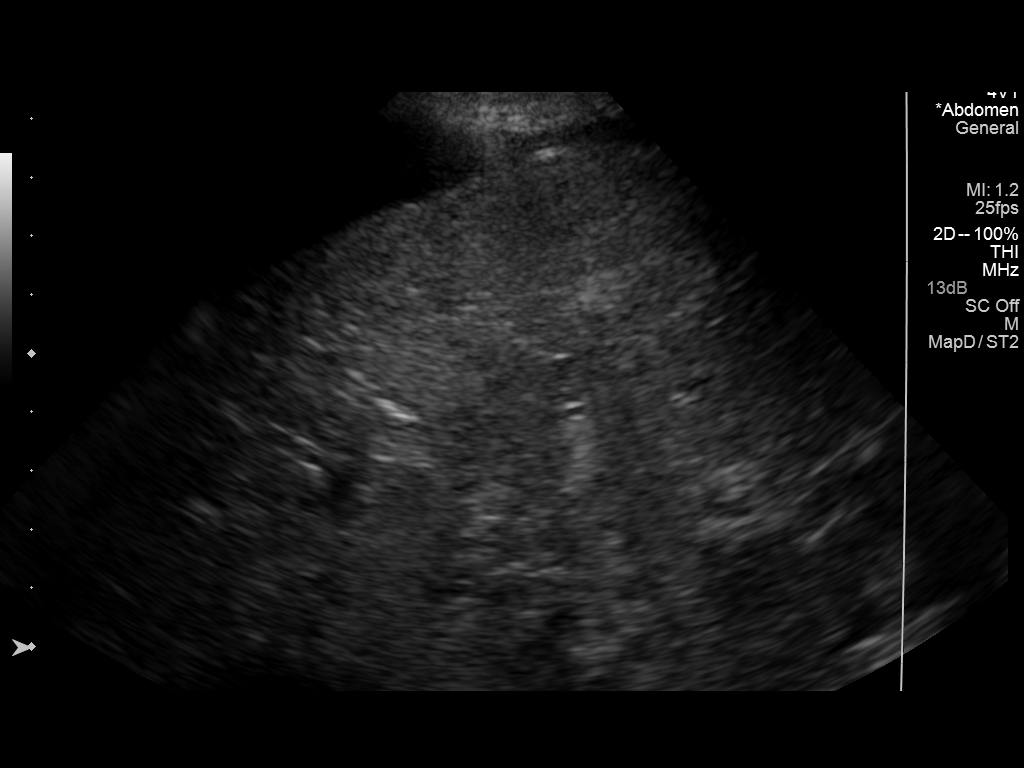
[im 2/5]
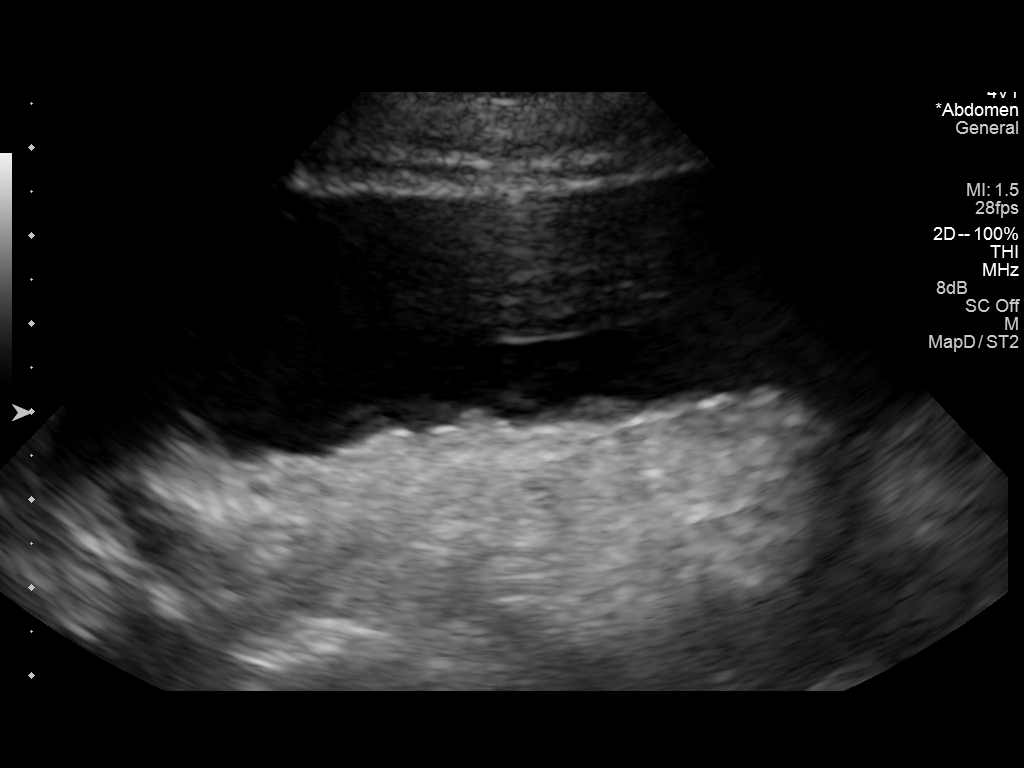
[im 3/5]
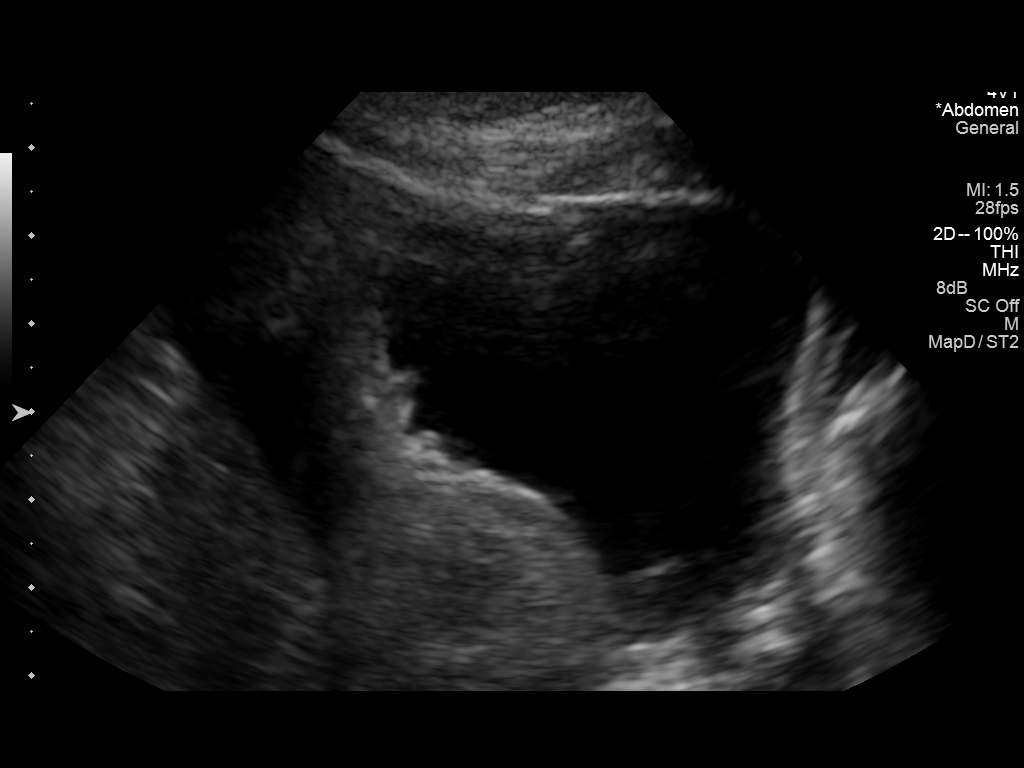
[im 4/5]
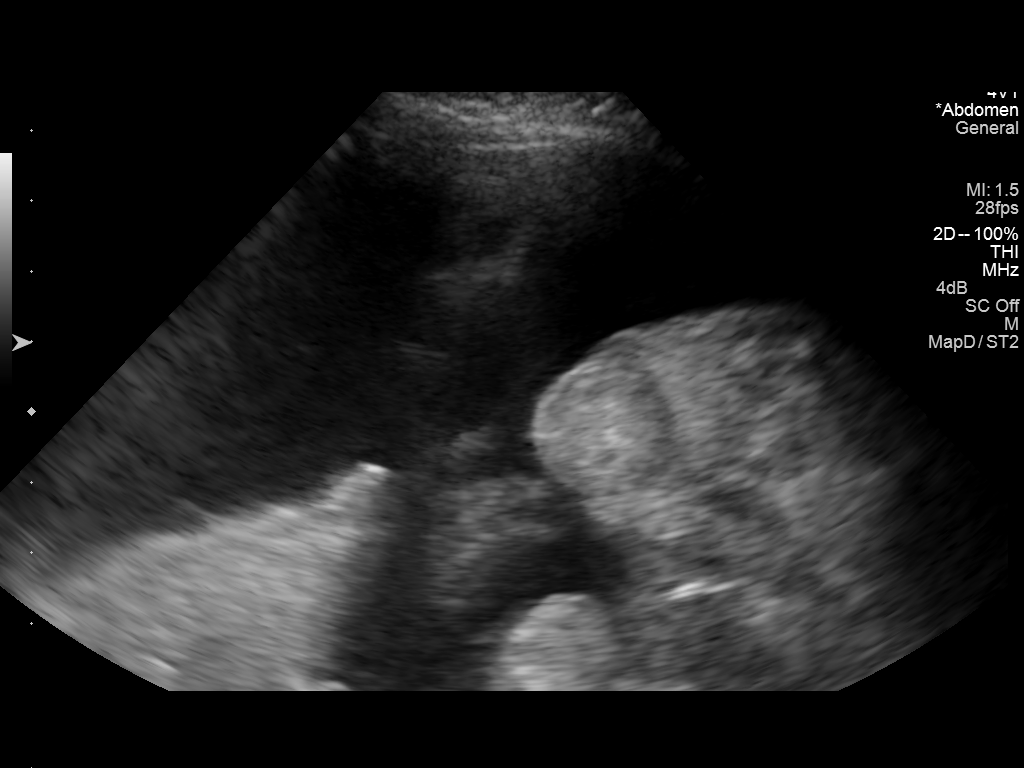
[im 5/5]
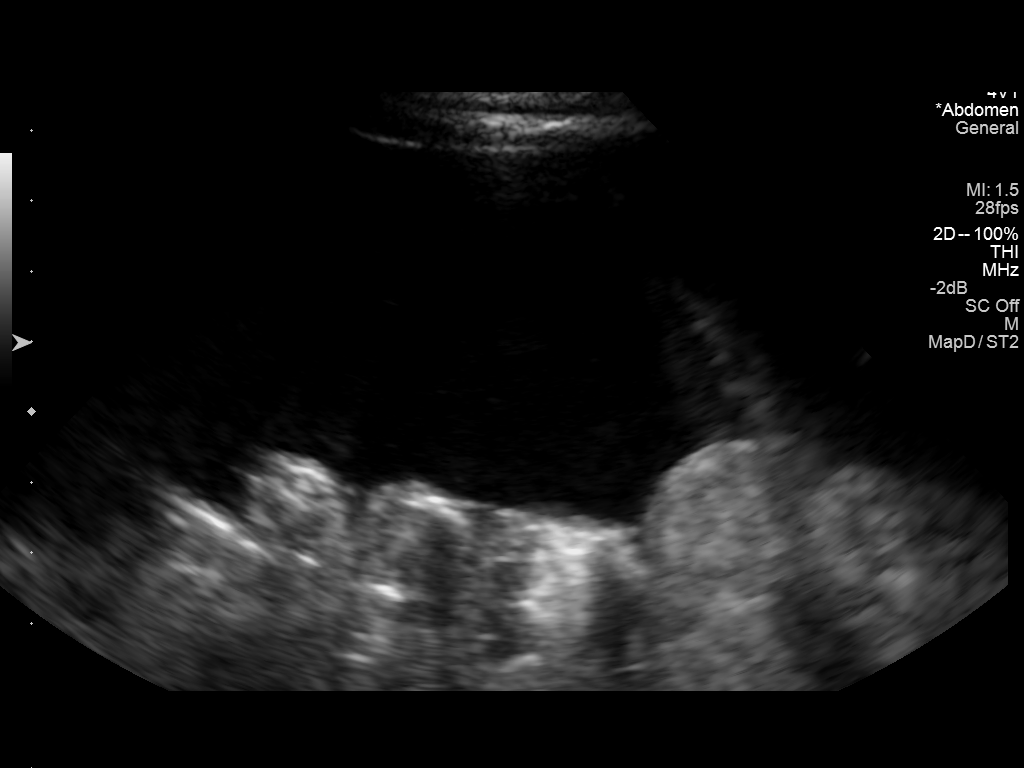

[5 of 5 positions shown; findings below may reference images not displayed]

PROCEDURE:
An ultrasound guided paracentesis was thoroughly discussed with the
patient and questions answered. The benefits, risks, alternatives
and complications were also discussed. The patient understands and
wishes to proceed with the procedure. Written consent was obtained.

Ultrasound was performed to localize and mark an adequate pocket of
fluid in the right lower quadrant of the abdomen. The area was then
prepped and draped in the normal sterile fashion. 1% Lidocaine was
used for local anesthesia. Under ultrasound guidance a 19 gauge Yueh
catheter was introduced. Paracentesis was performed. The catheter
was removed and a dressing applied.

COMPLICATIONS:
None.
FINDINGS: A total of approximately 1.8 L of clear yellow fluid was removed. A
fluid sample was not sent for laboratory analysis.
IMPRESSION: Successful ultrasound guided paracentesis yielding 1.8 L of ascites.

## 2015-12-12 IMAGING — US US PARACENTESIS
1 series · 1 of 1 positions shown · non-contrast
Comparison: 01/15/2015

CLINICAL DATA: Hepatocellular carcinoma, ascites, abdominal
distention and pain

EXAM:
ULTRASOUND GUIDED THERAPEUTIC PARACENTESIS

[Series 1: us paracentesis · 0.19mm/px · 1 of 1 slices shown]
[im 1/1]
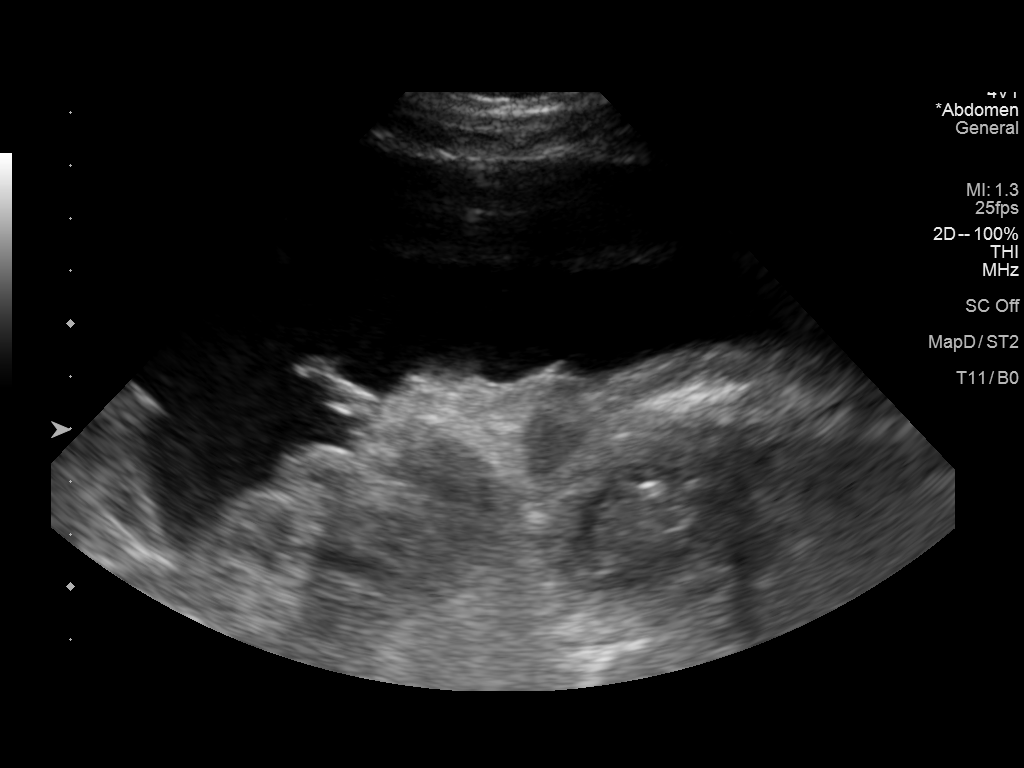

[1 of 1 positions shown; findings below may reference images not displayed]

PROCEDURE:
Procedure, benefits, and risks of procedure were discussed with
patient.

Written informed consent for procedure was obtained.

Time out protocol followed.

Adequate collection of ascites localized by ultrasound in RIGHT.

Skin prepped and draped in usual sterile fashion.

Skin and soft tissues anesthetized with 10 mL of 1% lidocaine.

5 French Yueh catheter placed into peritoneal cavity.

3144 mL of amber colored ascitic fluid was aspirated by vacuum
bottle suction.

Procedure tolerated well by patient without immediate complication.

COMPLICATIONS:
None.
FINDINGS: As above.
IMPRESSION: Successful ultrasound guided paracentesis yielding 3144 mL of
ascites.

## 2015-12-19 IMAGING — CR DG CHEST 1V PORT
1 series · 1 of 1 positions shown · non-contrast
Comparison: 12/28/2013

CLINICAL DATA: Short of breath

EXAM:
PORTABLE CHEST 1 VIEW

[ap portable]
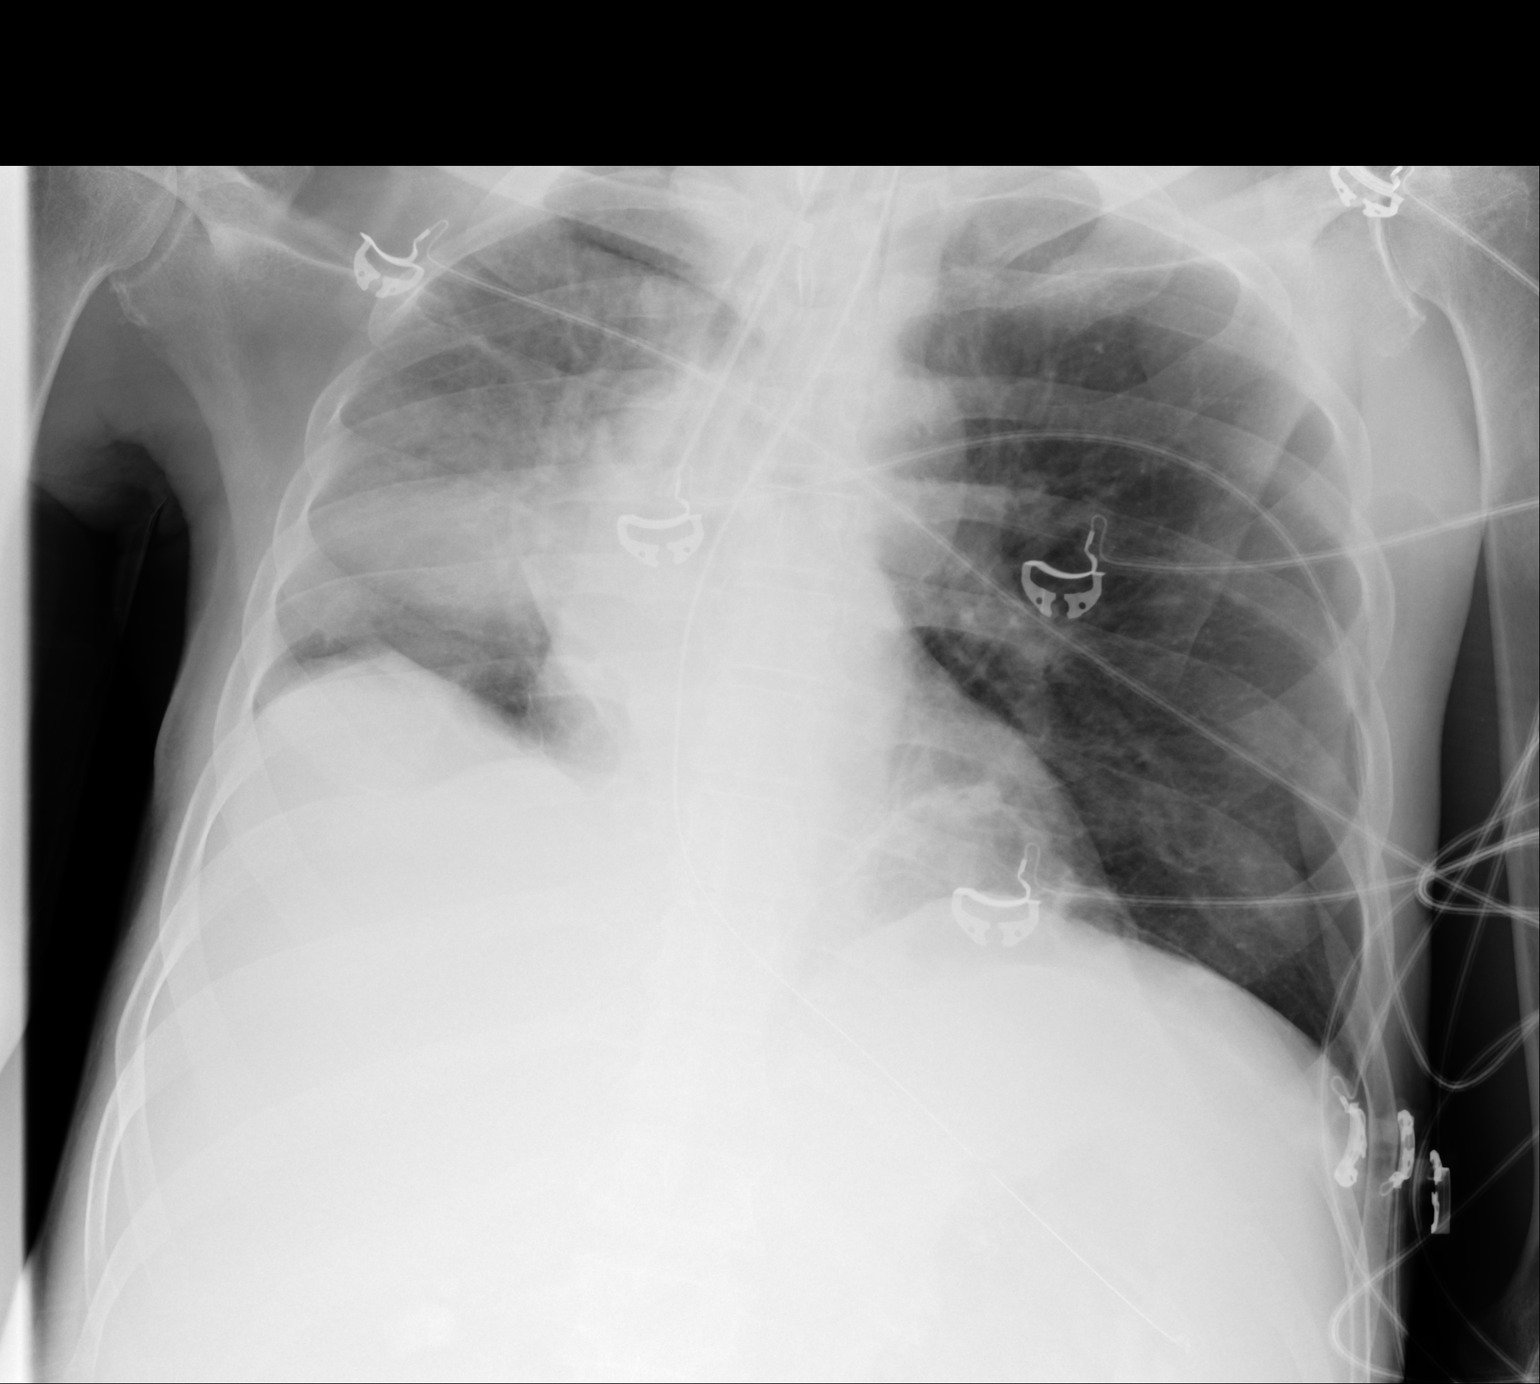

[1 of 1 positions shown; findings below may reference images not displayed]

FINDINGS: Endotracheal tube tip is in the right mainstem bronchus. There is
significant volume loss in the right lung with shift of the
mediastinum to the right. The right upper lobe is probably
collapsed. Opacity in the right midlung likely represents an element
of collapse in the peripheral segment of the right mid lobe.
Underlying mass is not excluded. NG tube tip is in the fundus of the
stomach. Left lung is clear. No pneumothorax.
IMPRESSION: Right mainstem endotracheal tube intubation with collapse of the
right upper lobe and peripheral segment of the right middle lobe.
This needs to be retracted. Right mid lung mass is not excluded.

## 2015-12-19 IMAGING — CR DG CHEST 1V PORT SAME DAY
1 series · 1 of 1 positions shown · non-contrast
Comparison: Chest x-ray from earlier same day.

CLINICAL DATA: Right-sided central line placement.

EXAM:
PORTABLE CHEST 1 VIEW

[ap portable]
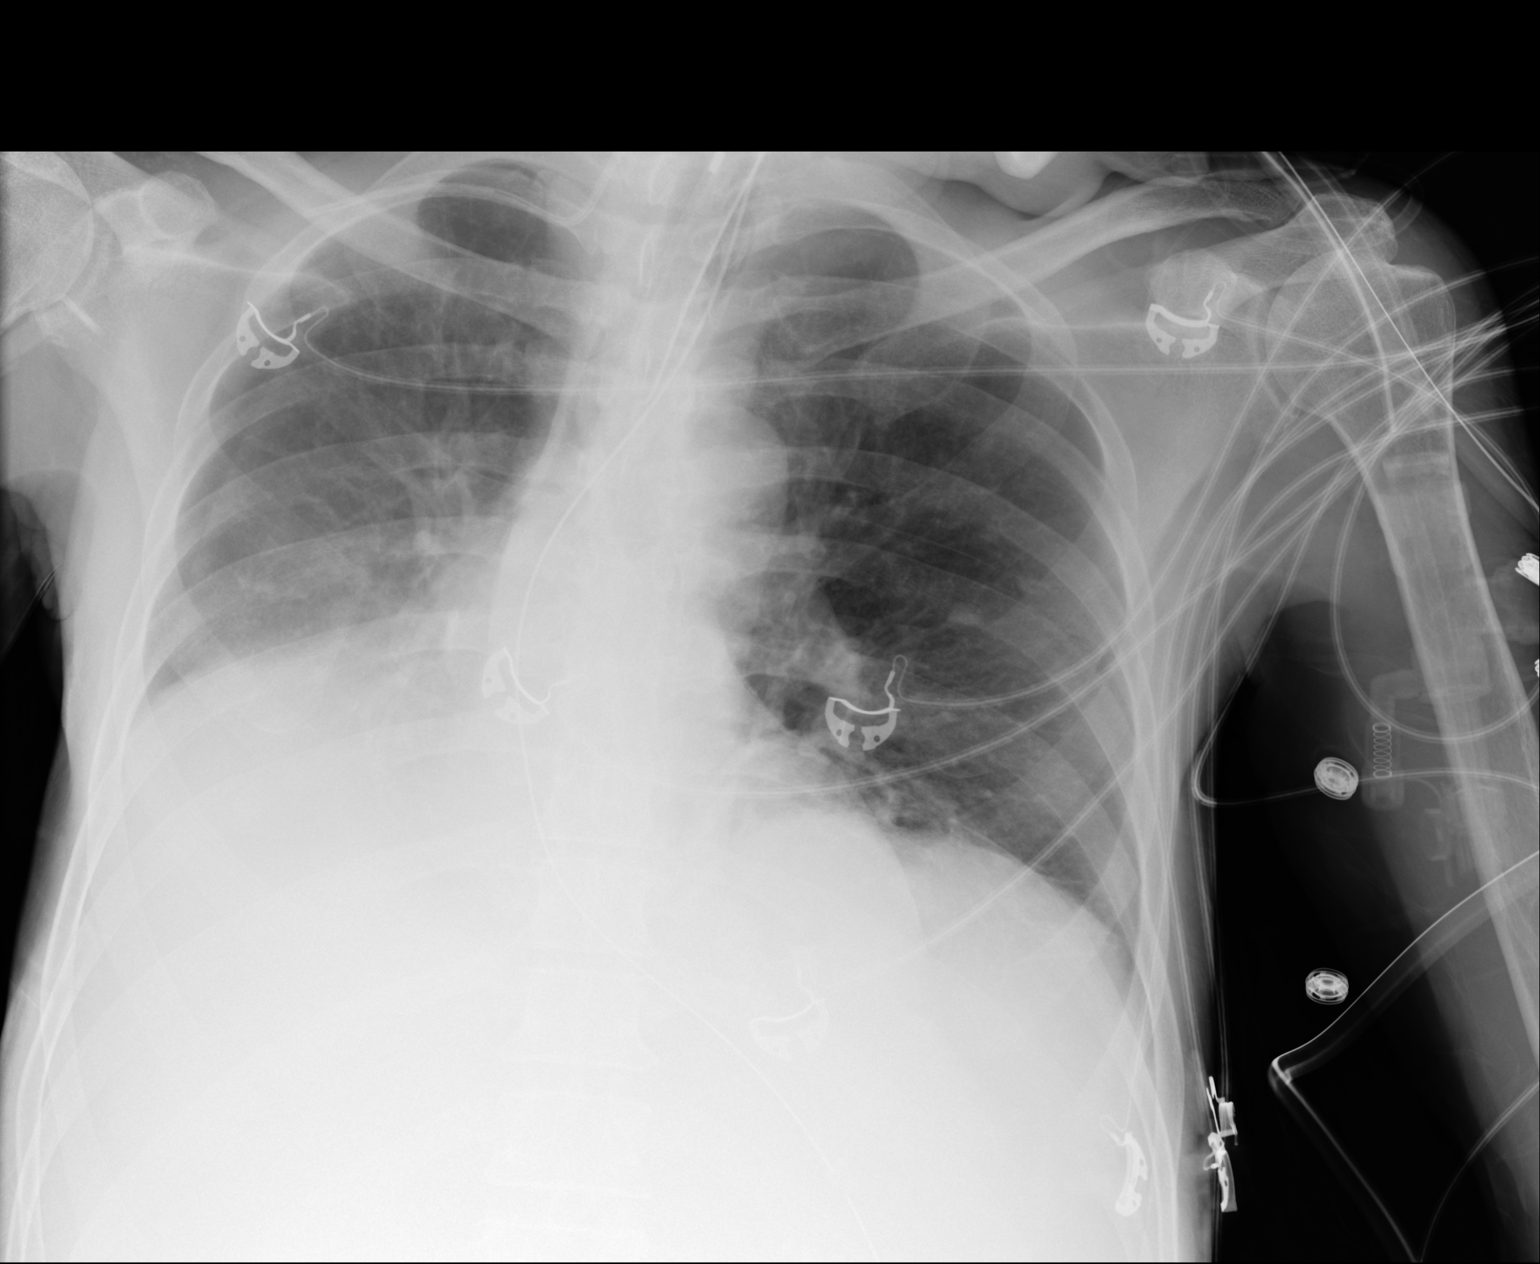

[1 of 1 positions shown; findings below may reference images not displayed]

FINDINGS: A right-sided internal jugular central line has been placed. Tip of
the central line extends laterally into the right subclavian vein.

Endotracheal tube remains well positioned with tip just above the
level of the carina. Enteric tube passes into the stomach.

Vague opacity at the right lung base is probably layering pleural
effusion combined with atelectasis. Left lung remains relatively
clear, perhaps mild atelectasis at the left lung base. No
pneumothorax seen. Cardiomediastinal silhouette is stable in size
and configuration.
IMPRESSION: 1. Right-sided IJ central line placed since the previous chest
x-ray. Tip of the central line passes laterally into the right
subclavian vein. Recommend repositioning.
2. Stable opacity at the right lung base, most likely a combination
of small layering pleural effusion and atelectasis. Underlying
pneumonia cannot be excluded.
These results will be called to the ordering clinician or
representative by the Radiologist Assistant, and communication
documented in the PACS or zVision Dashboard.

## 2015-12-19 IMAGING — CR DG CHEST 1V PORT
1 series · 1 of 1 positions shown · non-contrast
Comparison: Study obtained earlier in the day

CLINICAL DATA: Repositioned endotracheal tube.  Hypoxia.

EXAM:
PORTABLE CHEST 1 VIEW

[ap portable]
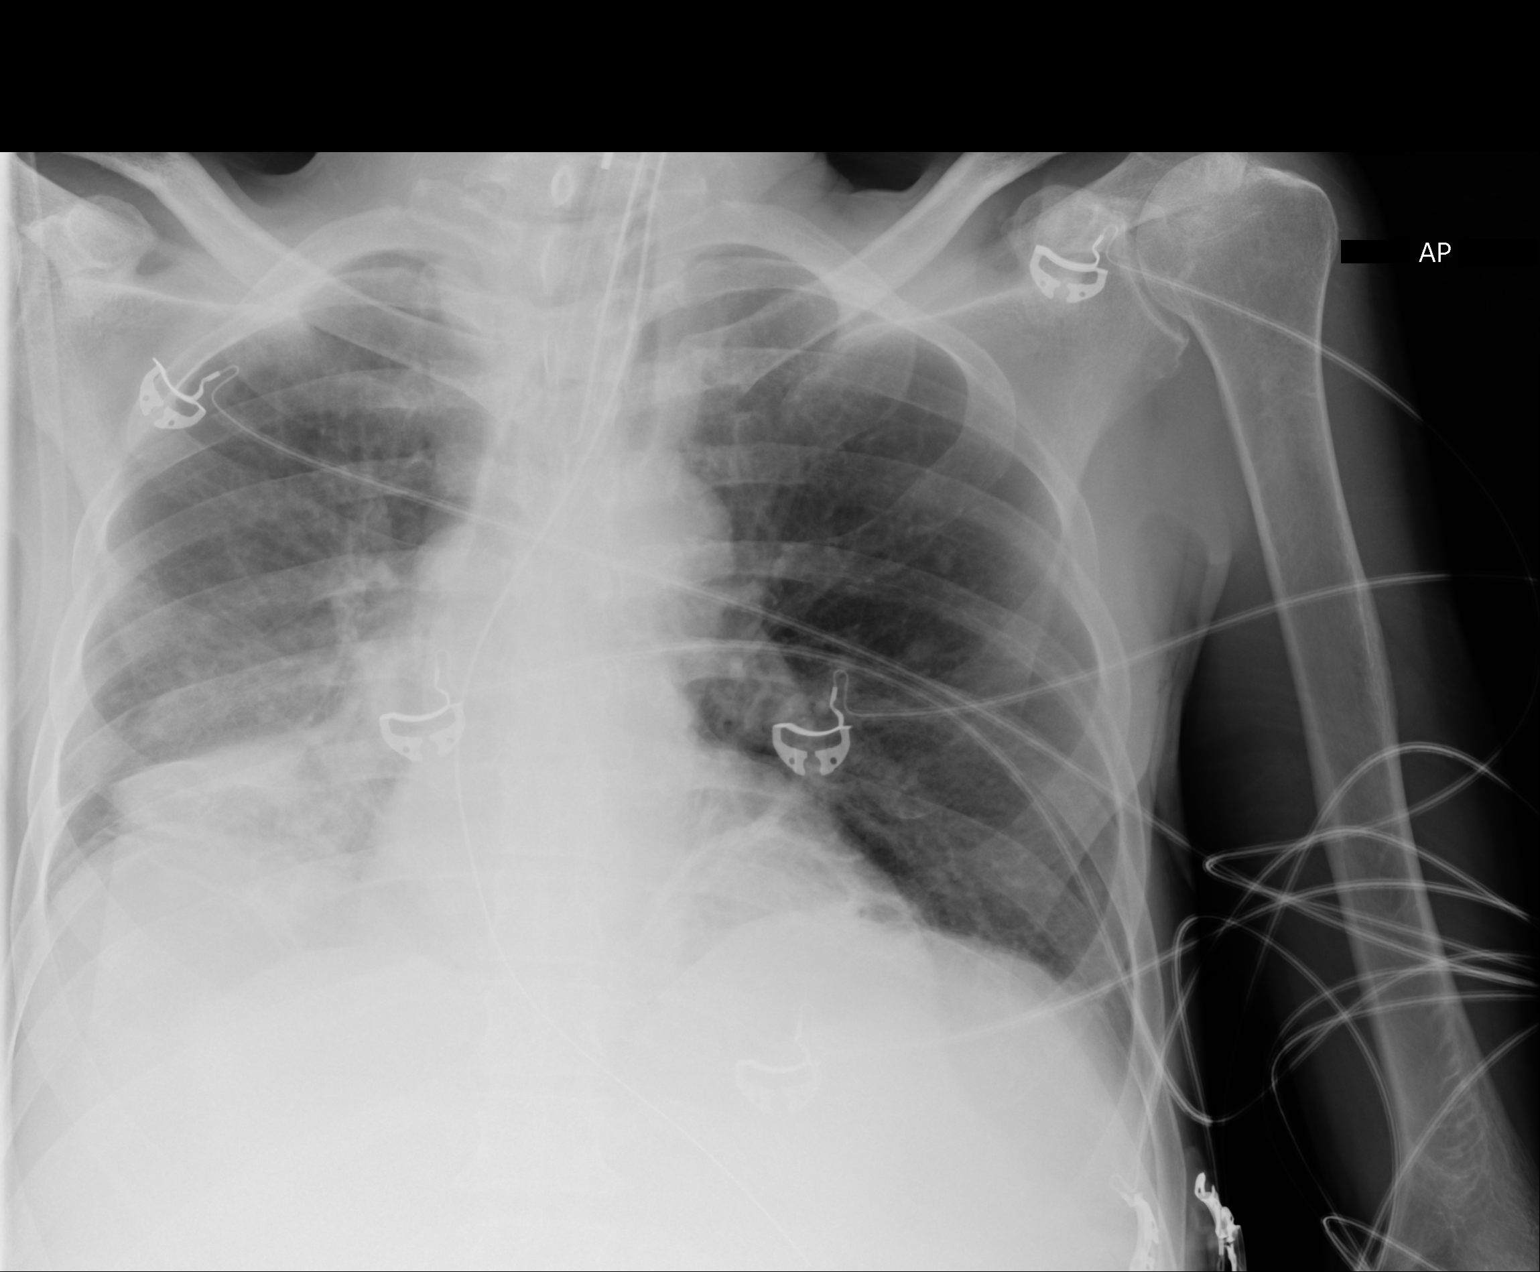

[1 of 1 positions shown; findings below may reference images not displayed]

FINDINGS: Endotracheal tube tip is 2.9 cm above the carina. Nasogastric tube
tip and side port are below the diaphragm. No pneumothorax. There is
persistent consolidation in the right base with volume loss in the
right upper lobe superiorly. There is atelectasis in the left base,
stable. No new opacity. Heart size and pulmonary vascularity are
normal. No adenopathy.
IMPRESSION: Areas of consolidation and volume loss on the right. Atelectasis
left base. No new opacity. Tube positions as described without
pneumothorax.

## 2016-04-28 ENCOUNTER — Encounter: Payer: Self-pay | Admitting: Interventional Radiology

## 2018-11-08 NOTE — Progress Notes (Signed)
error
